# Patient Record
Sex: Female | Born: 1975
Health system: Southern US, Community
[De-identification: ages and names within clinical notes are randomized; demographics above are authoritative.]

## PROBLEM LIST (undated history)

## (undated) ENCOUNTER — Inpatient Hospital Stay (HOSPITAL_COMMUNITY): Payer: Self-pay

## (undated) ENCOUNTER — Ambulatory Visit: Admission: EM | Payer: BC Managed Care – PPO | Source: Home / Self Care

## (undated) DIAGNOSIS — F1290 Cannabis use, unspecified, uncomplicated: Secondary | ICD-10-CM

## (undated) DIAGNOSIS — J449 Chronic obstructive pulmonary disease, unspecified: Secondary | ICD-10-CM

## (undated) DIAGNOSIS — I10 Essential (primary) hypertension: Secondary | ICD-10-CM

## (undated) DIAGNOSIS — D649 Anemia, unspecified: Secondary | ICD-10-CM

## (undated) DIAGNOSIS — Z720 Tobacco use: Secondary | ICD-10-CM

## (undated) DIAGNOSIS — M21612 Bunion of left foot: Secondary | ICD-10-CM

## (undated) DIAGNOSIS — O24419 Gestational diabetes mellitus in pregnancy, unspecified control: Secondary | ICD-10-CM

## (undated) DIAGNOSIS — I701 Atherosclerosis of renal artery: Secondary | ICD-10-CM

## (undated) DIAGNOSIS — M2042 Other hammer toe(s) (acquired), left foot: Secondary | ICD-10-CM

## (undated) HISTORY — PX: OTHER SURGICAL HISTORY: SHX169

## (undated) HISTORY — PX: COLONOSCOPY: SHX174

## (undated) HISTORY — DX: Essential (primary) hypertension: I10

---

## 1998-05-10 ENCOUNTER — Emergency Department (HOSPITAL_COMMUNITY): Admission: EM | Admit: 1998-05-10 | Discharge: 1998-05-10 | Payer: Self-pay | Admitting: Emergency Medicine

## 1999-02-19 ENCOUNTER — Emergency Department (HOSPITAL_COMMUNITY): Admission: EM | Admit: 1999-02-19 | Discharge: 1999-02-19 | Payer: Self-pay | Admitting: Emergency Medicine

## 1999-02-19 ENCOUNTER — Encounter: Payer: Self-pay | Admitting: Emergency Medicine

## 1999-09-16 ENCOUNTER — Emergency Department (HOSPITAL_COMMUNITY): Admission: EM | Admit: 1999-09-16 | Discharge: 1999-09-16 | Payer: Self-pay | Admitting: Internal Medicine

## 2000-03-04 ENCOUNTER — Inpatient Hospital Stay (HOSPITAL_COMMUNITY): Admission: AD | Admit: 2000-03-04 | Discharge: 2000-03-04 | Payer: Self-pay | Admitting: Obstetrics & Gynecology

## 2000-03-31 ENCOUNTER — Emergency Department (HOSPITAL_COMMUNITY): Admission: EM | Admit: 2000-03-31 | Discharge: 2000-03-31 | Payer: Self-pay | Admitting: Emergency Medicine

## 2000-10-04 ENCOUNTER — Emergency Department (HOSPITAL_COMMUNITY): Admission: EM | Admit: 2000-10-04 | Discharge: 2000-10-04 | Payer: Self-pay | Admitting: Emergency Medicine

## 2001-05-11 ENCOUNTER — Emergency Department (HOSPITAL_COMMUNITY): Admission: EM | Admit: 2001-05-11 | Discharge: 2001-05-11 | Payer: Self-pay | Admitting: Emergency Medicine

## 2001-07-18 ENCOUNTER — Emergency Department (HOSPITAL_COMMUNITY): Admission: EM | Admit: 2001-07-18 | Discharge: 2001-07-18 | Payer: Self-pay | Admitting: Emergency Medicine

## 2001-07-21 ENCOUNTER — Emergency Department (HOSPITAL_COMMUNITY): Admission: EM | Admit: 2001-07-21 | Discharge: 2001-07-22 | Payer: Self-pay | Admitting: Emergency Medicine

## 2002-02-16 ENCOUNTER — Emergency Department (HOSPITAL_COMMUNITY): Admission: EM | Admit: 2002-02-16 | Discharge: 2002-02-16 | Payer: Self-pay | Admitting: Emergency Medicine

## 2002-03-13 ENCOUNTER — Inpatient Hospital Stay (HOSPITAL_COMMUNITY): Admission: AD | Admit: 2002-03-13 | Discharge: 2002-03-13 | Payer: Self-pay | Admitting: *Deleted

## 2002-04-20 ENCOUNTER — Emergency Department (HOSPITAL_COMMUNITY): Admission: EM | Admit: 2002-04-20 | Discharge: 2002-04-20 | Payer: Self-pay | Admitting: Emergency Medicine

## 2002-05-19 ENCOUNTER — Emergency Department (HOSPITAL_COMMUNITY): Admission: EM | Admit: 2002-05-19 | Discharge: 2002-05-19 | Payer: Self-pay | Admitting: Emergency Medicine

## 2002-08-23 ENCOUNTER — Encounter: Payer: Self-pay | Admitting: Emergency Medicine

## 2002-08-23 ENCOUNTER — Emergency Department (HOSPITAL_COMMUNITY): Admission: EM | Admit: 2002-08-23 | Discharge: 2002-08-23 | Payer: Self-pay | Admitting: Emergency Medicine

## 2003-01-16 ENCOUNTER — Emergency Department (HOSPITAL_COMMUNITY): Admission: EM | Admit: 2003-01-16 | Discharge: 2003-01-16 | Payer: Self-pay | Admitting: Emergency Medicine

## 2003-02-01 ENCOUNTER — Emergency Department (HOSPITAL_COMMUNITY): Admission: EM | Admit: 2003-02-01 | Discharge: 2003-02-01 | Payer: Self-pay | Admitting: *Deleted

## 2003-02-02 ENCOUNTER — Inpatient Hospital Stay (HOSPITAL_COMMUNITY): Admission: EM | Admit: 2003-02-02 | Discharge: 2003-02-06 | Payer: Self-pay | Admitting: Emergency Medicine

## 2003-10-16 ENCOUNTER — Emergency Department (HOSPITAL_COMMUNITY): Admission: EM | Admit: 2003-10-16 | Discharge: 2003-10-17 | Payer: Self-pay | Admitting: Emergency Medicine

## 2004-01-14 ENCOUNTER — Emergency Department (HOSPITAL_COMMUNITY): Admission: EM | Admit: 2004-01-14 | Discharge: 2004-01-14 | Payer: Self-pay | Admitting: Emergency Medicine

## 2005-04-07 ENCOUNTER — Emergency Department (HOSPITAL_COMMUNITY): Admission: EM | Admit: 2005-04-07 | Discharge: 2005-04-07 | Payer: Self-pay | Admitting: Emergency Medicine

## 2005-11-20 ENCOUNTER — Emergency Department (HOSPITAL_COMMUNITY): Admission: EM | Admit: 2005-11-20 | Discharge: 2005-11-21 | Payer: Self-pay | Admitting: Emergency Medicine

## 2005-11-23 ENCOUNTER — Emergency Department (HOSPITAL_COMMUNITY): Admission: EM | Admit: 2005-11-23 | Discharge: 2005-11-23 | Payer: Self-pay | Admitting: Family Medicine

## 2007-06-23 ENCOUNTER — Inpatient Hospital Stay (HOSPITAL_COMMUNITY): Admission: AD | Admit: 2007-06-23 | Discharge: 2007-06-23 | Payer: Self-pay | Admitting: Obstetrics & Gynecology

## 2007-11-13 ENCOUNTER — Emergency Department (HOSPITAL_COMMUNITY): Admission: EM | Admit: 2007-11-13 | Discharge: 2007-11-14 | Payer: Self-pay | Admitting: Emergency Medicine

## 2007-12-13 ENCOUNTER — Emergency Department (HOSPITAL_COMMUNITY): Admission: EM | Admit: 2007-12-13 | Discharge: 2007-12-13 | Payer: Self-pay | Admitting: Emergency Medicine

## 2008-02-16 ENCOUNTER — Emergency Department (HOSPITAL_COMMUNITY): Admission: EM | Admit: 2008-02-16 | Discharge: 2008-02-16 | Payer: Self-pay | Admitting: Emergency Medicine

## 2008-08-13 ENCOUNTER — Emergency Department (HOSPITAL_COMMUNITY): Admission: EM | Admit: 2008-08-13 | Discharge: 2008-08-13 | Payer: Self-pay | Admitting: Emergency Medicine

## 2008-08-15 ENCOUNTER — Emergency Department (HOSPITAL_COMMUNITY): Admission: EM | Admit: 2008-08-15 | Discharge: 2008-08-15 | Payer: Self-pay | Admitting: Family Medicine

## 2009-02-08 ENCOUNTER — Emergency Department (HOSPITAL_COMMUNITY): Admission: EM | Admit: 2009-02-08 | Discharge: 2009-02-09 | Payer: Self-pay | Admitting: Emergency Medicine

## 2009-04-13 ENCOUNTER — Emergency Department (HOSPITAL_COMMUNITY): Admission: EM | Admit: 2009-04-13 | Discharge: 2009-04-14 | Payer: Self-pay | Admitting: Emergency Medicine

## 2009-05-28 ENCOUNTER — Ambulatory Visit: Payer: Self-pay | Admitting: Family Medicine

## 2009-05-28 LAB — CONVERTED CEMR LAB
Basophils Absolute: 0.1 10*3/uL (ref 0.0–0.1)
Basophils Relative: 1 % (ref 0–1)
CO2: 19 meq/L (ref 19–32)
Calcium: 9.2 mg/dL (ref 8.4–10.5)
Chloride: 107 meq/L (ref 96–112)
Cholesterol: 137 mg/dL (ref 0–200)
Creatinine, Ser: 0.79 mg/dL (ref 0.40–1.20)
Eosinophils Absolute: 0.3 10*3/uL (ref 0.0–0.7)
Glucose, Bld: 71 mg/dL (ref 70–99)
HDL: 51 mg/dL (ref >39)
LDL Cholesterol: 70 mg/dL (ref 0–99)
Lymphocytes Relative: 36 % (ref 12–46)
MCV: 94.5 fL (ref 78.0–100.0)
Monocytes Relative: 8 % (ref 3–12)
Neutro Abs: 3.7 10*3/uL (ref 1.7–7.7)
RBC: 4.54 M/uL (ref 3.87–5.11)
Sodium: 142 meq/L (ref 135–145)
TSH: 0.323 microintl units/mL — ABNORMAL LOW (ref 0.350–4.500)
Total CHOL/HDL Ratio: 2.7
Vit D, 25-Hydroxy: 19 ng/mL — ABNORMAL LOW (ref 30–89)
WBC: 7.1 10*3/uL (ref 4.0–10.5)

## 2009-06-12 ENCOUNTER — Ambulatory Visit: Payer: Self-pay | Admitting: Family Medicine

## 2009-06-13 ENCOUNTER — Emergency Department (HOSPITAL_COMMUNITY): Admission: EM | Admit: 2009-06-13 | Discharge: 2009-06-13 | Payer: Self-pay | Admitting: Emergency Medicine

## 2009-07-03 ENCOUNTER — Ambulatory Visit: Payer: Self-pay | Admitting: Internal Medicine

## 2009-07-03 ENCOUNTER — Encounter (INDEPENDENT_AMBULATORY_CARE_PROVIDER_SITE_OTHER): Payer: Self-pay | Admitting: Family Medicine

## 2009-07-03 LAB — CONVERTED CEMR LAB
BUN: 12 mg/dL (ref 6–23)
Free T4: 1.12 ng/dL (ref 0.80–1.80)
Potassium: 4.1 meq/L (ref 3.5–5.3)
Sodium: 138 meq/L (ref 135–145)
T3, Free: 3.1 pg/mL (ref 2.3–4.2)

## 2009-09-11 ENCOUNTER — Ambulatory Visit: Payer: Self-pay | Admitting: Internal Medicine

## 2009-10-15 ENCOUNTER — Ambulatory Visit: Payer: Self-pay | Admitting: Family Medicine

## 2009-10-15 ENCOUNTER — Encounter (INDEPENDENT_AMBULATORY_CARE_PROVIDER_SITE_OTHER): Payer: Self-pay | Admitting: Family Medicine

## 2009-10-15 LAB — CONVERTED CEMR LAB
BUN: 11 mg/dL (ref 6–23)
Calcium: 9.3 mg/dL (ref 8.4–10.5)
Creatinine, Ser: 0.87 mg/dL (ref 0.40–1.20)

## 2009-12-18 ENCOUNTER — Emergency Department (HOSPITAL_COMMUNITY): Admission: EM | Admit: 2009-12-18 | Discharge: 2009-12-18 | Payer: Self-pay | Admitting: Emergency Medicine

## 2010-01-28 ENCOUNTER — Inpatient Hospital Stay (HOSPITAL_COMMUNITY)
Admission: AD | Admit: 2010-01-28 | Discharge: 2010-01-28 | Payer: Self-pay | Source: Home / Self Care | Admitting: Obstetrics and Gynecology

## 2010-05-04 LAB — GC/CHLAMYDIA PROBE AMP, GENITAL
Chlamydia, DNA Probe: NEGATIVE
GC Probe Amp, Genital: NEGATIVE

## 2010-05-04 LAB — WET PREP, GENITAL: Yeast Wet Prep HPF POC: NONE SEEN

## 2010-05-04 LAB — URINALYSIS, ROUTINE W REFLEX MICROSCOPIC
Bilirubin Urine: NEGATIVE
Glucose, UA: NEGATIVE mg/dL
Leukocytes, UA: NEGATIVE
Protein, ur: NEGATIVE mg/dL

## 2010-05-04 LAB — URINE MICROSCOPIC-ADD ON

## 2010-05-11 ENCOUNTER — Other Ambulatory Visit: Payer: Self-pay | Admitting: Family Medicine

## 2010-05-11 ENCOUNTER — Other Ambulatory Visit (HOSPITAL_COMMUNITY): Payer: Self-pay | Admitting: Family Medicine

## 2010-05-11 DIAGNOSIS — Z1231 Encounter for screening mammogram for malignant neoplasm of breast: Secondary | ICD-10-CM

## 2010-05-25 LAB — URINE MICROSCOPIC-ADD ON

## 2010-05-25 LAB — CBC
HCT: 38.1 % (ref 36.0–46.0)
Hemoglobin: 12.9 g/dL (ref 12.0–15.0)
MCHC: 33.7 g/dL (ref 30.0–36.0)
Platelets: 235 10*3/uL (ref 150–400)

## 2010-05-25 LAB — URINALYSIS, ROUTINE W REFLEX MICROSCOPIC
Glucose, UA: NEGATIVE mg/dL
Nitrite: NEGATIVE
Protein, ur: NEGATIVE mg/dL
Urobilinogen, UA: 1 mg/dL (ref 0.0–1.0)

## 2010-05-25 LAB — BASIC METABOLIC PANEL
BUN: 10 mg/dL (ref 6–23)
CO2: 24 mEq/L (ref 19–32)
Calcium: 8.7 mg/dL (ref 8.4–10.5)
GFR calc Af Amer: >60 mL/min (ref >60)
GFR calc non Af Amer: >60 mL/min (ref >60)
Potassium: 3.7 mEq/L (ref 3.5–5.1)
Sodium: 139 mEq/L (ref 135–145)

## 2010-05-25 LAB — PREGNANCY, URINE: Preg Test, Ur: NEGATIVE

## 2010-05-25 LAB — DIFFERENTIAL: Lymphocytes Relative: 30 % (ref 12–46)

## 2010-07-10 NOTE — H&P (Signed)
[name], Mueller                       ACCOUNT NO.:  [id number]   MEDICAL RECORD NO.:  [id number]                   PATIENT TYPE:  EMS   LOCATION:  ED                                   FACILITY:  [facility]   PHYSICIAN:  Leonia Reeves, MD                 DATE OF BIRTH:  1976/02/17   DATE OF ADMISSION:  02/02/2003  DATE OF DISCHARGE:                                HISTORY & PHYSICAL   PRIMARY CARE PHYSICIAN:  None listed.   ADMITTING DIAGNOSES AND PLAN:  1. Acute urinary tract infection.  2. Acute pyelonephritis.  Urine culture significant for E. coli sensitive to     Cipro.  IV Cipro.  3. Abdominal pain, nausea, vomiting.  IV Demerol p.r.n.  4. Acute dehydration.  IV rehydration with D-5 1/2 normal saline plus     potassium chloride.  Lab:  BMP in the morning, tomorrow.  Blood culture.   CHIEF COMPLAINT:  Abdominal pain, nausea, vomiting, fever and chills.   HISTORY OF PRESENT ILLNESS:  [name] is a 35 year old African-American  female with no known past medical history, who was treated yesterday in  [facility] Emergency Department for urinary tract infection and discharged  with p.o. Cipro, who presents with worsening nausea, vomiting, fever,  chills, and pains in suprapubic and flank areas of abdomen.  Patient says  since yesterday she has been vomiting continuously and cannot hold down any  food or drink.  She has had fever associated with chills but could not state  the exact temperature.  She also has had abdominal pain around the  suprapubic and both flank areas.  She denied cough, sputum production, chest  pain, and shortness of breath.   PAST MEDICAL HISTORY:  No known medical history.  Patient denied history of  diabetes mellitus, hypertension, and hypercholesterolemia.  She also denied  history of renal disease.   FAMILY HISTORY:  Diabetes runs in the family.   SOCIAL HISTORY:  Patient works as a Dispensing optician.  She smokes about two packs  of cigarettes per  day, drinks alcohol occasionally.  She also admitted to  use of marijuana.   ALLERGIES:  PENICILLIN.   CURRENT MEDICATIONS:  No prescribed medication.   REVIEW OF SYSTEMS:  CONSTITUTIONAL:  Fever, general weakness.  CARDIOPULMONARY:  No chest pain and no sputum production.  GIT:  Abdominal  pain, nausea, and vomiting.  No diarrhea.  Other systems reviewed negative.   PHYSICAL EXAMINATION:  GENERAL:  A young thin-built African-American female  in mild distress secondary to abdominal discomfort.  VITAL SIGNS:  Temperature 98.3, blood pressure 132/71, pulse 89, respiratory  rate 18, oxygen saturation 100% on air.  CNS:  Alert and oriented x3.  HEENT:  Head is normocephalic, atraumatic.  Pupils equal, round, reactive to  light and accommodation.  Sclerae is anicteric.  Mucous membrane is dry  consistent with severe dehydration.  Nares are patent.  No  evidence of  oropharyngeal lesion.  CARDIOVASCULAR:  Normal S1 and S2, no S3 gallop.  No rub, no murmur  appreciated.  RESPIRATORY:  Lungs are clear to auscultation and percussion.  Bilateral  breath sounds are normal.  GIT:  Abdomen is flat.  Significant suprapubic tenderness and also  significant bilateral flank tenderness.  EXTREMITIES:  No edema, no cyanosis.  NEURO:  No focal neurological deficit.  Cranial nerves II-XII are grossly  intact.   LABORATORY DATA:  Urine culture positive for Escherichia coli sensitive to  Cipro.  White count elevated at 14.1, hemoglobin 12.5, hematocrit 37.5,  platelets 288.  Sodium 141, potassium 3.7, chloride 110, CO2 25, BUN 13,  creatinine 1.1, glucose 171.  Urinalysis positive nitrite, leukocyte  esterase small, bacteria many.   ASSESSMENT:  A young African-American female who came to emergency  department yesterday and was treated for urinary tract infection with p.o.  Cipro and discharged who now presents with severe nausea and vomiting,  abdominal pain and fever associated with chills.  Patient  will be admitted  to the medical floor and managed as listed above.  For urinary tract  infection and pyelonephritis, IV Cipro.  For dehydration, IV D-5 1/2 normal  saline plus potassium chloride.  For abdominal pain, IV Demerol as needed.  For nausea and vomiting, IV Phenergan as needed.  For tobacco abuse, patient  would benefit from nicotine patch.  I have taken this opportunity during  this encounter to counsel patient regarding tobacco cessation.  She seems to  understand that continued use of tobacco will worsen her medical problem and  even predispose her to other medical problems including lung cancer and  chronic obstructive pulmonary disease.  Deep vein thrombosis prophylaxis  will be done with subcutaneous Lovenox and gastroesophageal prophylaxis will  be done with p.o. Protonix.  The rationale for this admission has been  discussed with patient and she is agreeable.                                               Leonia Reeves, MD    VO/MEDQ  D:  02/02/2003  T:  02/02/2003  Job:  [id number]

## 2010-07-10 NOTE — Discharge Summary (Signed)
Christine Mueller, Christine Mueller                       ACCOUNT NO.:  [id number]   MEDICAL RECORD NO.:  [id number]                   PATIENT TYPE:  INP   LOCATION:  0463                                 FACILITY:  [facility]   PHYSICIAN:  Melissa L. Ladona Ridgel, MD               DATE OF BIRTH:  August 08, 1975   DATE OF ADMISSION:  02/02/2003  DATE OF DISCHARGE:  02/06/2003                                 DISCHARGE SUMMARY   ADMITTING DIAGNOSES:  1. Acute urinary tract infection, being treated.  2. Acute pyelonephritis, being treated with p.o. Cipro, status post IV     Cipro.  3. Abdominal pain, nausea and vomiting, dehydration resolved.   DISCHARGE DIAGNOSES:  1. Klebsiella pneumonia, urinary tract infection, sensitive to Cipro.  2. Gastritis, resolved.  3. Hypovolemia, resolved.  4. Candida vulvovaginitis, resolving with treatment.  5. Tobacco abuse.   HISTORY OF PRESENT ILLNESS:  The patient is a 35 year old African-American  female, who presented to the [facility] Emergency Room on the day prior to  admission, complaining of urinary tract infection type symptoms, consisting  of nausea, vomiting, fever, chills, suprapubic pain, and flank pain.  She  had been given oral Cipro to treat her symptoms and then returned on the day  of admission for the worsening complaints of fever, chills, nausea,  vomiting, and severe abdominal pain.  She was admitted for rehabilitation  and IV antibiotics.   HOSPITAL COURSE:  The patient was admitted to nontelemetry floor where she  was aggressively rehydrated and started on IV ciprofloxacin.  Over the  course of her hospitalization, she reached nadir in her abdominal pain and  was able to tolerate oral intake without severe nausea or vomiting.  Before  reaching this point, however, she did undergo an episode of small amount of  unwitnessed hematemesis according to the patient and her family.  This is  likely secondary to mild gastritis related to her oral repletion  for  potassium.  Her symptoms correlated with administration of her potassium and  once placed on Pepcid 20 mg p.o. b.i.d., the patient no longer complained of  symptoms, nausea, vomiting, or abdominal upset.  The patient's course was  also complicated by symptoms of a vaginal yeast infection for which she was  treated with topical and intervaginal Miconazole.  Her potassium was  repleted, both p.o. and IV during the course of her hospital stay and on the  day of discharge, the patient was found to have stable vital signs.  She was  afebrile, able to tolerate full liquids as well as a full regular meal.  Generally, she was in no acute distress.  Her pupils were equal, round, and  reactive to light.  Her extraocular muscles were intact.  Her mucous  membranes were moist.  Her chest was clear to auscultation.  There were no  rubs, rales, or wheezes.  Her cardiovascular again was within normal limits,  regular  rate and rhythm, positive S1, S2.  Extremities showed no edema.  Her  white count had decreased to 7 with hemoglobin of 10.8 and hematocrit of  325.  Her potassium on the day of discharge was 3 with a BUN of 5 and a  creatinine of 0.9.  she was given IV repletion prior to discharge and was  instructed on how to replete her potassium at home using increased dietary  uptake.   MEDICATIONS ON DISCHARGE:  1. Cipro 250 mg p.o. b.i.d. x 6 more days.  2. Pepcid 20 mg p.o. b.i.d. x 14 days.  3. Miconazole nitrate over-the-counter to treat her yeast infection.  4. She was not in need of any pain medication at the time of discharge.  Her     activity was not restricted.   Her diet was instructed to be of increased potassium intake, bananas and  orange juice in particular were suggested.  There was no wound care  instruction.  Her follow-up was to be with her primary doctor as needed.  At  the time of discharge, she was deemed stable to continue treatment as an  outpatient on oral  antibiotics.                                               Melissa L. Ladona Ridgel, MD    MLT/MEDQ  D:  02/16/2003  T:  02/16/2003  Job:  [id number]

## 2010-08-05 ENCOUNTER — Inpatient Hospital Stay (INDEPENDENT_AMBULATORY_CARE_PROVIDER_SITE_OTHER)
Admission: RE | Admit: 2010-08-05 | Discharge: 2010-08-05 | Disposition: A | Discharge status: Home / Self Care | Payer: Self-pay | Source: Ambulatory Visit | Attending: Family Medicine | Admitting: Family Medicine

## 2010-08-05 DIAGNOSIS — A499 Bacterial infection, unspecified: Secondary | ICD-10-CM

## 2010-08-05 DIAGNOSIS — N760 Acute vaginitis: Secondary | ICD-10-CM

## 2010-08-20 ENCOUNTER — Other Ambulatory Visit: Payer: Self-pay | Admitting: Internal Medicine

## 2010-09-01 ENCOUNTER — Other Ambulatory Visit: Payer: Self-pay | Admitting: Internal Medicine

## 2010-11-18 LAB — URINALYSIS, ROUTINE W REFLEX MICROSCOPIC
Bilirubin Urine: NEGATIVE
Glucose, UA: NEGATIVE
Urobilinogen, UA: 0.2

## 2010-11-18 LAB — POCT PREGNANCY, URINE: Preg Test, Ur: NEGATIVE

## 2010-11-18 LAB — SAMPLE TO BLOOD BANK

## 2010-11-19 ENCOUNTER — Emergency Department (HOSPITAL_COMMUNITY)
Admission: EM | Admit: 2010-11-19 | Discharge: 2010-11-19 | Disposition: A | Discharge status: Home / Self Care | Payer: Self-pay | Attending: Emergency Medicine | Admitting: Emergency Medicine

## 2010-11-19 DIAGNOSIS — J069 Acute upper respiratory infection, unspecified: Secondary | ICD-10-CM | POA: Insufficient documentation

## 2010-11-19 DIAGNOSIS — J029 Acute pharyngitis, unspecified: Secondary | ICD-10-CM | POA: Insufficient documentation

## 2010-11-19 DIAGNOSIS — I10 Essential (primary) hypertension: Secondary | ICD-10-CM | POA: Insufficient documentation

## 2010-11-23 LAB — COMPREHENSIVE METABOLIC PANEL
ALT: 22
AST: 26
Albumin: 3.7
Alkaline Phosphatase: 83
BUN: 8
CO2: 24
Calcium: 9
Chloride: 108
Creatinine, Ser: 0.9
GFR calc Af Amer: >60
GFR calc non Af Amer: >60
Glucose, Bld: 85
Potassium: 3.6
Sodium: 139
Total Bilirubin: 0.7
Total Protein: 5.9 — ABNORMAL LOW

## 2010-11-23 LAB — DIFFERENTIAL
Eosinophils Absolute: 0.4
Lymphocytes Relative: 41
Lymphs Abs: 2.9
Monocytes Relative: 10
Neutro Abs: 2.9
Neutrophils Relative %: 42 — ABNORMAL LOW

## 2010-11-23 LAB — CBC
Hemoglobin: 13.2
MCV: 96.8
RBC: 4.05
WBC: 7

## 2010-11-23 LAB — URINALYSIS, ROUTINE W REFLEX MICROSCOPIC
Glucose, UA: NEGATIVE
Hgb urine dipstick: NEGATIVE
Specific Gravity, Urine: 1.03
Urobilinogen, UA: 0.2

## 2010-11-23 LAB — URINE MICROSCOPIC-ADD ON

## 2010-11-23 LAB — POCT PREGNANCY, URINE: Preg Test, Ur: NEGATIVE

## 2011-01-24 DIAGNOSIS — J4489 Other specified chronic obstructive pulmonary disease: Secondary | ICD-10-CM | POA: Insufficient documentation

## 2011-01-24 DIAGNOSIS — R05 Cough: Secondary | ICD-10-CM | POA: Insufficient documentation

## 2011-01-24 DIAGNOSIS — J40 Bronchitis, not specified as acute or chronic: Secondary | ICD-10-CM | POA: Insufficient documentation

## 2011-01-24 DIAGNOSIS — F17200 Nicotine dependence, unspecified, uncomplicated: Secondary | ICD-10-CM | POA: Insufficient documentation

## 2011-01-24 DIAGNOSIS — IMO0001 Reserved for inherently not codable concepts without codable children: Secondary | ICD-10-CM | POA: Insufficient documentation

## 2011-01-24 DIAGNOSIS — R079 Chest pain, unspecified: Secondary | ICD-10-CM | POA: Insufficient documentation

## 2011-01-24 DIAGNOSIS — R059 Cough, unspecified: Secondary | ICD-10-CM | POA: Insufficient documentation

## 2011-01-24 DIAGNOSIS — J449 Chronic obstructive pulmonary disease, unspecified: Secondary | ICD-10-CM | POA: Insufficient documentation

## 2011-01-25 ENCOUNTER — Emergency Department (HOSPITAL_COMMUNITY)
Admission: EM | Admit: 2011-01-25 | Discharge: 2011-01-25 | Disposition: A | Discharge status: Home / Self Care | Payer: Self-pay | Attending: Emergency Medicine | Admitting: Emergency Medicine

## 2011-01-25 DIAGNOSIS — J40 Bronchitis, not specified as acute or chronic: Secondary | ICD-10-CM

## 2011-01-25 HISTORY — DX: Essential (primary) hypertension: I10

## 2011-01-25 HISTORY — DX: Chronic obstructive pulmonary disease, unspecified: J44.9

## 2011-01-25 MED ORDER — HYDROCOD POLST-CHLORPHEN POLST 10-8 MG/5ML PO LQCR: 5.0000 mL | Freq: Two times a day (BID) | ORAL | Status: DC

## 2011-01-25 MED ORDER — AZITHROMYCIN 250 MG PO TABS: 500.0000 mg | ORAL_TABLET | Freq: Once | ORAL | Status: AC

## 2011-01-25 MED ORDER — ALBUTEROL SULFATE HFA 108 (90 BASE) MCG/ACT IN AERS
2.0000 | INHALATION_SPRAY | Freq: Once | RESPIRATORY_TRACT | Status: AC
  Administered 2011-01-25: 2 via RESPIRATORY_TRACT
  Filled 2011-01-25: qty 6.7

## 2011-01-25 NOTE — ED Provider Notes (Signed)
History     CSN: [id number] Arrival date & time: 01/25/2011  1:19 AM   First MD Initiated Contact with Patient 01/25/11 0245      Chief Complaint  Patient presents with  . Sore Throat    (Consider location/radiation/quality/duration/timing/severity/associated sxs/prior treatment) HPI Comments: Patient here with a week history of runny nose, cough with green sputum production, sore throat, wheezing fever and chest pain with cough  Patient is a 35 y.o. female presenting with pharyngitis. The history is provided by the patient. No language interpreter was used.  Sore Throat This is a new problem. The current episode started in the past 7 days. The problem occurs constantly. The problem has been unchanged. Associated symptoms include arthralgias, chest pain, coughing, a fever, myalgias and a sore throat. Pertinent negatives include no abdominal pain, anorexia, chills, neck pain, swollen glands or vomiting. The symptoms are aggravated by drinking and eating. She has tried nothing for the symptoms. The treatment provided no relief.    Past Medical History  Diagnosis Date  . Hypertension   . COPD (chronic obstructive pulmonary disease)   . Asthma     History reviewed. No pertinent past surgical history.  No family history on file.  History  Substance Use Topics  . Smoking status: Current Everyday Smoker -- 1.0 packs/day for 15 years    Types: Cigarettes  . Smokeless tobacco: Not on file  . Alcohol Use: No    OB History    Grav Para Term Preterm Abortions TAB SAB Ect Mult Living                  Review of Systems  Constitutional: Positive for fever. Negative for chills.  HENT: Positive for sore throat and rhinorrhea. Negative for ear pain and neck pain.   Eyes: Negative for pain.  Respiratory: Positive for cough and wheezing.   Cardiovascular: Positive for chest pain.  Gastrointestinal: Negative.  Negative for vomiting, abdominal pain and anorexia.  Genitourinary:  Negative.   Musculoskeletal: Positive for myalgias and arthralgias.  Neurological: Negative.   Hematological: Negative.   Psychiatric/Behavioral: Negative.     Allergies  Penicillins  Home Medications   Current Outpatient Rx  Name Route Sig Dispense Refill  . ALBUTEROL SULFATE HFA 108 (90 BASE) MCG/ACT IN AERS Inhalation Inhale 2 puffs into the lungs every 6 (six) hours as needed. weezing     . MAGIC MOUTHWASH Oral Take 15 mLs by mouth 4 (four) times daily as needed. Sore throat     . THERA M PLUS PO TABS Oral Take 1 tablet by mouth daily.      Marland Kitchen PRENATAL 27-0.8 MG PO TABS Oral Take 1 tablet by mouth daily.        BP 141/75  Pulse 90  Temp(Src) 98.8 F (37.1 C) (Oral)  Resp 20  SpO2 99%  LMP 01/20/2011  Physical Exam  Nursing note and vitals reviewed. Constitutional: She is oriented to person, place, and time. She appears well-developed and well-nourished.  HENT:  Head: Normocephalic and atraumatic.  Right Ear: External ear normal.  Left Ear: External ear normal.  Mouth/Throat: Oropharynx is clear and moist. No oropharyngeal exudate.  Eyes: Conjunctivae are normal. Pupils are equal, round, and reactive to light.  Neck: Normal range of motion. Neck supple.  Cardiovascular: Normal rate, regular rhythm, normal heart sounds and intact distal pulses.  Exam reveals no gallop and no friction rub.   No murmur heard. Pulmonary/Chest: Effort normal and breath sounds normal. No respiratory distress.  She has no wheezes.  Abdominal: Soft. Bowel sounds are normal. She exhibits no distension. There is no tenderness.  Musculoskeletal: Normal range of motion.  Lymphadenopathy:    She has no cervical adenopathy.  Neurological: She is alert and oriented to person, place, and time. No cranial nerve deficit.  Skin: Skin is warm and dry.  Psychiatric: She has a normal mood and affect. Her behavior is normal. Judgment and thought content normal.    ED Course  Procedures (including  critical care time)  Labs Reviewed - No data to display No results found.   Bronchitis   MDM  Bronchitis in this smoker - plan to place on z-pack, inhaler and cough medication        Scarlette Calico C. Combs, [place] 01/25/11 [phone]

## 2011-01-25 NOTE — ED Notes (Signed)
Pt reports that she has had dry cough for the past 2-3 days and sore throat for the past 5 days. Pt reports having periodic fever treating with OTC meds.  Pt reports that bilat sides are sore from cough.

## 2011-01-25 NOTE — ED Notes (Signed)
Rx given to pt. 

## 2011-01-26 NOTE — ED Provider Notes (Signed)
Medical screening examination/treatment/procedure(s) were performed by non-physician practitioner and as supervising physician I was immediately available for consultation/collaboration.  Marcas Bowsher R. Edrees Valent, MD 01/26/11 1946 

## 2011-03-25 ENCOUNTER — Encounter (HOSPITAL_COMMUNITY): Payer: Self-pay | Admitting: *Deleted

## 2011-03-25 ENCOUNTER — Emergency Department (INDEPENDENT_AMBULATORY_CARE_PROVIDER_SITE_OTHER)
Admission: EM | Admit: 2011-03-25 | Discharge: 2011-03-25 | Disposition: A | Discharge status: Home / Self Care | Payer: Self-pay | Source: Home / Self Care | Attending: Family Medicine | Admitting: Family Medicine

## 2011-03-25 DIAGNOSIS — S0180XA Unspecified open wound of other part of head, initial encounter: Secondary | ICD-10-CM

## 2011-03-25 DIAGNOSIS — S0181XA Laceration without foreign body of other part of head, initial encounter: Secondary | ICD-10-CM
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
## 2011-04-02 ENCOUNTER — Encounter (HOSPITAL_COMMUNITY): Payer: Self-pay | Admitting: Emergency Medicine

## 2011-04-02 ENCOUNTER — Emergency Department (INDEPENDENT_AMBULATORY_CARE_PROVIDER_SITE_OTHER)
Admission: EM | Admit: 2011-04-02 | Discharge: 2011-04-02 | Disposition: A | Discharge status: Home / Self Care | Payer: Self-pay | Source: Home / Self Care | Attending: Family Medicine | Admitting: Family Medicine

## 2011-04-02 DIAGNOSIS — Z4802 Encounter for removal of sutures: Secondary | ICD-10-CM

## 2011-04-02 NOTE — ED Provider Notes (Signed)
History     CSN: [id number]  Arrival date & time 04/02/11  1030   First MD Initiated Contact with Patient 04/02/11 1201      Chief Complaint  Patient presents with  . Suture / Staple Removal    (Consider location/radiation/quality/duration/timing/severity/associated sxs/prior treatment) Patient is a 36 y.o. female presenting with suture removal. The history is provided by the patient.  Suture / Staple Removal  Treated in ED: seen on 1/31 for eyebrow lac. Treatments since wound repair include antibiotic ointment use. There has been no drainage from the wound. There is no redness present. There is no swelling present. The pain has no pain.    Past Medical History  Diagnosis Date  . Hypertension   . COPD (chronic obstructive pulmonary disease)   . Asthma     No past surgical history on file.  No family history on file.  History  Substance Use Topics  . Smoking status: Current Everyday Smoker -- 1.0 packs/day for 15 years    Types: Cigarettes  . Smokeless tobacco: Not on file  . Alcohol Use: No    OB History    Grav Para Term Preterm Abortions TAB SAB Ect Mult Living                  Review of Systems  Allergies  Penicillins  Home Medications   Current Outpatient Rx  Name Route Sig Dispense Refill  . ALBUTEROL SULFATE HFA 108 (90 BASE) MCG/ACT IN AERS Inhalation Inhale 2 puffs into the lungs every 6 (six) hours as needed. weezing     . MAGIC MOUTHWASH Oral Take 15 mLs by mouth 4 (four) times daily as needed. Sore throat     . HYDROCOD POLST-CPM POLST ER 10-8 MG/5ML PO LQCR Oral Take 5 mLs by mouth every 12 (twelve) hours. 140 mL 0  . THERA M PLUS PO TABS Oral Take 1 tablet by mouth daily.      Marland Kitchen PRENATAL 27-0.8 MG PO TABS Oral Take 1 tablet by mouth daily.        BP 111/78  Pulse 80  Temp(Src) 98.4 F (36.9 C) (Oral)  Resp 16  SpO2 98%  LMP 03/11/2011  Physical Exam  Nursing note and vitals reviewed. Constitutional: She appears well-developed and  well-nourished.  Skin: Skin is warm and dry.       Lac well healed, 4 stitches removed, bacitracin ointment.    ED Course  Procedures (including critical care time)  Labs Reviewed - No data to display No results found.   1. Visit for suture removal       MDM  Sutures removed.        Barkley Bruns, MD 04/02/11 [phone]

## 2011-04-02 NOTE — ED Notes (Signed)
PT HERE FOR SUTURE REMOVAL LEFT BROW S/P LACERATION LAST Thursday.DENIES PAIN OR DRAINAGE.

## 2011-07-13 ENCOUNTER — Other Ambulatory Visit: Payer: Self-pay | Admitting: Family Medicine

## 2011-07-13 DIAGNOSIS — N6310 Unspecified lump in the right breast, unspecified quadrant: Secondary | ICD-10-CM

## 2011-07-22 ENCOUNTER — Ambulatory Visit
Admission: RE | Admit: 2011-07-22 | Discharge: 2011-07-22 | Disposition: A | Discharge status: Home / Self Care | Payer: Self-pay | Source: Ambulatory Visit | Attending: Family Medicine | Admitting: Family Medicine

## 2011-07-22 DIAGNOSIS — N6310 Unspecified lump in the right breast, unspecified quadrant: Secondary | ICD-10-CM

## 2011-09-23 ENCOUNTER — Emergency Department (HOSPITAL_COMMUNITY)
Admission: EM | Admit: 2011-09-23 | Discharge: 2011-09-23 | Disposition: A | Discharge status: Home / Self Care | Payer: No Typology Code available for payment source | Attending: Emergency Medicine | Admitting: Emergency Medicine

## 2011-09-23 ENCOUNTER — Emergency Department (HOSPITAL_COMMUNITY): Payer: Self-pay

## 2011-09-23 DIAGNOSIS — F17200 Nicotine dependence, unspecified, uncomplicated: Secondary | ICD-10-CM | POA: Insufficient documentation

## 2011-09-23 DIAGNOSIS — Z880 Allergy status to penicillin: Secondary | ICD-10-CM | POA: Insufficient documentation

## 2011-09-23 DIAGNOSIS — J4489 Other specified chronic obstructive pulmonary disease: Secondary | ICD-10-CM | POA: Insufficient documentation

## 2011-09-23 DIAGNOSIS — J449 Chronic obstructive pulmonary disease, unspecified: Secondary | ICD-10-CM | POA: Insufficient documentation

## 2011-09-23 DIAGNOSIS — M545 Low back pain, unspecified: Secondary | ICD-10-CM | POA: Insufficient documentation

## 2011-09-23 DIAGNOSIS — I10 Essential (primary) hypertension: Secondary | ICD-10-CM | POA: Insufficient documentation

## 2011-09-23 MED ORDER — CYCLOBENZAPRINE HCL 5 MG PO TABS: 5.0000 mg | ORAL_TABLET | Freq: Three times a day (TID) | ORAL | Status: AC | PRN

## 2011-09-23 MED ORDER — ACETAMINOPHEN 325 MG PO TABS
650.0000 mg | ORAL_TABLET | Freq: Once | ORAL | Status: AC
  Administered 2011-09-23: 650 mg via ORAL
  Filled 2011-09-23: qty 2

## 2011-09-23 MED ORDER — NAPROXEN 500 MG PO TABS: 500.0000 mg | ORAL_TABLET | Freq: Two times a day (BID) | ORAL | Status: AC
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
## 2011-12-16 ENCOUNTER — Other Ambulatory Visit: Payer: Self-pay | Admitting: Family Medicine

## 2011-12-16 DIAGNOSIS — N63 Unspecified lump in unspecified breast: Secondary | ICD-10-CM

## 2012-01-04 ENCOUNTER — Other Ambulatory Visit: Payer: Self-pay

## 2012-01-06 ENCOUNTER — Ambulatory Visit
Admission: RE | Admit: 2012-01-06 | Discharge: 2012-01-06 | Disposition: A | Discharge status: Home / Self Care | Payer: Self-pay | Source: Ambulatory Visit | Attending: Family Medicine | Admitting: Family Medicine

## 2012-01-06 DIAGNOSIS — N63 Unspecified lump in unspecified breast: Secondary | ICD-10-CM

## 2012-05-31 ENCOUNTER — Encounter (HOSPITAL_COMMUNITY): Payer: Self-pay | Admitting: Emergency Medicine

## 2012-05-31 ENCOUNTER — Emergency Department (INDEPENDENT_AMBULATORY_CARE_PROVIDER_SITE_OTHER)
Admission: EM | Admit: 2012-05-31 | Discharge: 2012-05-31 | Disposition: A | Discharge status: Home / Self Care | Payer: Self-pay | Source: Home / Self Care | Attending: Emergency Medicine | Admitting: Emergency Medicine

## 2012-05-31 DIAGNOSIS — L0390 Cellulitis, unspecified: Secondary | ICD-10-CM

## 2012-05-31 MED ORDER — IBUPROFEN 800 MG PO TABS
800.0000 mg | ORAL_TABLET | Freq: Once | ORAL | Status: AC
  Administered 2012-05-31: 800 mg via ORAL

## 2012-05-31 MED ORDER — OXYCODONE-ACETAMINOPHEN 5-325 MG PO TABS: ORAL_TABLET | ORAL | Status: DC

## 2012-05-31 MED ORDER — IBUPROFEN 800 MG PO TABS
ORAL_TABLET | ORAL | Status: AC
  Filled 2012-05-31: qty 1

## 2012-05-31 MED ORDER — SULFAMETHOXAZOLE-TMP DS 800-160 MG PO TABS: 2.0000 | ORAL_TABLET | Freq: Two times a day (BID) | ORAL | Status: DC

## 2012-05-31 NOTE — ED Notes (Signed)
Discharge pending dr Lorenz Coaster contacting dr Orlan Leavens

## 2012-05-31 NOTE — ED Notes (Signed)
Right hand pain.  Reports having cut at a callus or dry skin on palm of right hand.  Hand is painful, red, and swollen.

## 2012-05-31 NOTE — ED Provider Notes (Signed)
Chief Complaint:   Chief Complaint  Patient presents with  . Hand Pain    History of Present Illness:   Christine Mueller is a 37 year old female who has had a five-day history of pain in the palm of her right hand. She had a callus on the distal palmar crease, overlying the fifth metacarpal. She tried to trim this herself with a razor blade and ever since then it's been red, swollen, and painful. She denies any numbness or tingling. She's had no fever or chills. History tender to touch. She is able to flex and extend her little finger and ring finger without any difficulty.  Review of Systems:  Other than noted above, the patient denies any of the following symptoms: Systemic:  No fevers, chills, sweats, or aches.  No fatigue or tiredness. Musculoskeletal:  No joint pain, arthritis, bursitis, swelling, back pain, or neck pain. Neurological:  No muscular weakness, paresthesias, headache, or trouble with speech or coordination.  No dizziness.  PMFSH:  Past medical history, family history, social history, meds, and allergies were reviewed.  She has asthma and hypertension. She has no albuterol inhaler at home. She's not taking anything for her blood pressure. She is allergic to penicillin.  Physical Exam:   Vital signs:  BP 137/86  Pulse 82  Temp(Src) 98.5 F (36.9 C) (Oral)  Resp 16  SpO2 97%  LMP 04/26/2012 Gen:  Alert and oriented times 3.  In no distress. Musculoskeletal: She has a callus on the distal palmar crease overlying the fifth metacarpal. There is surrounding erythema. No visible collection of pus. She able to actively and passively flex and extend the little finger and the ring finger without any difficulty. Sensation is intact.  Otherwise, all joints had a full a ROM with no swelling, bruising or deformity.  No edema, pulses full. Extremities were warm and pink.  Capillary refill was brisk.  Skin:  Clear, warm and dry.  No rash. Neuro:  Alert and oriented times 3.  Muscle  strength was normal.  Sensation was intact to light touch.   Course in Urgent Care Center:   She was given ibuprofen 800 mg by mouth since she is driving home from here.  Assessment:  The encounter diagnosis was Cellulitis.  There is no evidence of tenosynovitis. I think this is a localized cellulitis. There is no evidence of a pus collection. I think she does need followup with a hand surgeon tomorrow, and have asked her to call Dr. Bradly Bienenstock.  Plan:   1.  The following meds were prescribed:   New Prescriptions   OXYCODONE-ACETAMINOPHEN (PERCOCET) 5-325 MG PER TABLET    1 to 2 tablets every 6 hours as needed for pain.   SULFAMETHOXAZOLE-TRIMETHOPRIM (BACTRIM DS) 800-160 MG PER TABLET    Take 2 tablets by mouth 2 (two) times daily.   2.  The patient was instructed in symptomatic care, including rest and activity, elevation, application of ice and compression.  Appropriate handouts were given. 3.  The patient was told to return if becoming worse in any way, if no better in 3 or 4 days, and given some red flag symptoms such as fever, worsening pain, disability, or neurological symptoms that would indicate earlier return.   4.  The patient was told to follow up with Dr. Bradly Bienenstock tomorrow.    Reuben Likes, MD 05/31/12 [phone]

## 2012-05-31 NOTE — ED Notes (Signed)
Last tetanus was January 2014-for her job

## 2012-06-02 ENCOUNTER — Telehealth (HOSPITAL_COMMUNITY): Payer: Self-pay | Admitting: *Deleted

## 2012-06-02 NOTE — ED Notes (Signed)
Pt. called on VM yesterday and said her Rx. was not at the [facility] on [facility]. She asked for it to be called in.  I called pt. back and told her it was at the [facility] and it should have been on her discharge instructions. She said she found it and got it yesterday. Vassie Moselle 06/02/2012

## 2013-07-31 ENCOUNTER — Emergency Department (INDEPENDENT_AMBULATORY_CARE_PROVIDER_SITE_OTHER)
Admission: EM | Admit: 2013-07-31 | Discharge: 2013-07-31 | Disposition: A | Discharge status: Home / Self Care | Payer: Self-pay | Source: Home / Self Care | Attending: Emergency Medicine | Admitting: Emergency Medicine

## 2013-07-31 ENCOUNTER — Other Ambulatory Visit (HOSPITAL_COMMUNITY)
Admission: RE | Admit: 2013-07-31 | Discharge: 2013-07-31 | Disposition: A | Discharge status: Home / Self Care | Payer: Self-pay | Source: Ambulatory Visit | Attending: Emergency Medicine | Admitting: Emergency Medicine

## 2013-07-31 ENCOUNTER — Encounter (HOSPITAL_COMMUNITY): Payer: Self-pay | Admitting: Emergency Medicine

## 2013-07-31 DIAGNOSIS — N760 Acute vaginitis: Secondary | ICD-10-CM

## 2013-07-31 DIAGNOSIS — L0390 Cellulitis, unspecified: Secondary | ICD-10-CM

## 2013-07-31 DIAGNOSIS — L0291 Cutaneous abscess, unspecified: Secondary | ICD-10-CM

## 2013-07-31 DIAGNOSIS — Z113 Encounter for screening for infections with a predominantly sexual mode of transmission: Secondary | ICD-10-CM | POA: Insufficient documentation

## 2013-07-31 LAB — POCT URINALYSIS DIP (DEVICE)
Bilirubin Urine: NEGATIVE
Glucose, UA: NEGATIVE mg/dL
Hgb urine dipstick: NEGATIVE
NITRITE: NEGATIVE
PROTEIN: NEGATIVE mg/dL
Specific Gravity, Urine: 1.02 (ref 1.005–1.030)
UROBILINOGEN UA: 0.2 mg/dL (ref 0.0–1.0)
pH: 7 (ref 5.0–8.0)

## 2013-07-31 LAB — POCT PREGNANCY, URINE: PREG TEST UR: NEGATIVE

## 2013-07-31 MED ORDER — METRONIDAZOLE 500 MG PO TABS: 500.0000 mg | ORAL_TABLET | Freq: Two times a day (BID) | ORAL | Status: DC

## 2013-07-31 MED ORDER — SULFAMETHOXAZOLE-TRIMETHOPRIM 800-160 MG PO TABS: 1.0000 | ORAL_TABLET | Freq: Two times a day (BID) | ORAL | Status: DC

## 2013-07-31 NOTE — ED Notes (Signed)
Please call [phone] for any lab issues

## 2013-07-31 NOTE — ED Provider Notes (Signed)
CSN: [id number]     Arrival date & time 07/31/13  1855 History   First MD Initiated Contact with Patient 07/31/13 1931     Chief Complaint  Patient presents with  . Abdominal Pain   (Consider location/radiation/quality/duration/timing/severity/associated sxs/prior Treatment) HPI Patient is a 38 yo F presenting to urgent care for vaginal discharge and bumps on leg.  Vaginal discharge- Started 2 days ago. White discharge, some odor. Reports pain with intercourse. No known exposure to STD. No dysuria. She has had BV in the past but this feels different. Some abdominal pain. No abnormal bleeding. LMP was May 25, 3 days of normal bleeding. She does report a new soap.  Bumps on legs- Reports pustules on right inner thigh x1 month. Drains yellow pus when it is open. Reports pain. NO fevers. New pustule came up recently, not drainage at this time. No redness around the bumps. No history of abscesses prior to this episode. No contacts with similar rash.   Past Medical History  Diagnosis Date  . Hypertension   . COPD (chronic obstructive pulmonary disease)   . Asthma    History reviewed. No pertinent past surgical history. History reviewed. No pertinent family history. History  Substance Use Topics  . Smoking status: Current Every Day Smoker -- 1.00 packs/day for 15 years    Types: Cigarettes  . Smokeless tobacco: Not on file  . Alcohol Use: No   OB History   Grav Para Term Preterm Abortions TAB SAB Ect Mult Living                 Review of Systems  Constitutional: Negative for fever and chills.  HENT: Negative for congestion.   Eyes: Negative for visual disturbance.  Respiratory: Negative for cough and shortness of breath.   Cardiovascular: Negative for chest pain and leg swelling.  Gastrointestinal: Negative for abdominal pain.  Genitourinary: Positive for vaginal discharge, vaginal pain and pelvic pain. Negative for dysuria and vaginal bleeding.  Musculoskeletal: Negative for  arthralgias and myalgias.  Skin: Positive for rash.  Neurological: Negative for headaches.    Allergies  Penicillins  Home Medications   Prior to Admission medications   Medication Sig Start Date End Date Taking? Authorizing Provider  albuterol (PROVENTIL HFA;VENTOLIN HFA) 108 (90 BASE) MCG/ACT inhaler Inhale 2 puffs into the lungs every 6 (six) hours as needed. weezing     Historical Provider, MD  Alum & Mag Hydroxide-Simeth (MAGIC MOUTHWASH) SOLN Take 15 mLs by mouth 4 (four) times daily as needed. Sore throat     Historical Provider, MD  lisinopril (PRINIVIL,ZESTRIL) 20 MG tablet Take 20 mg by mouth daily.    Historical Provider, MD  metroNIDAZOLE (FLAGYL) 500 MG tablet Take 1 tablet (500 mg total) by mouth 2 (two) times daily. 07/31/13   Lunah Losasso Nydia BoutonM Dagmawi Venable, MD  Multiple Vitamins-Minerals (MULTIVITAMINS THER. W/MINERALS) TABS Take 1 tablet by mouth daily.      Historical Provider, MD  oxyCODONE-acetaminophen (PERCOCET) 5-325 MG per tablet 1 to 2 tablets every 6 hours as needed for pain. 05/31/12   Reuben Likesavid C Keller, MD  Prenatal Vit-Fe Fumarate-FA (MULTIVITAMIN-PRENATAL) 27-0.8 MG TABS Take 1 tablet by mouth daily.      Historical Provider, MD  sulfamethoxazole-trimethoprim (BACTRIM DS) 800-160 MG per tablet Take 1 tablet by mouth 2 (two) times daily. 07/31/13   Haley Fuerstenberg Nydia BoutonM Devron Cohick, MD   BP 156/97  Pulse 82  Temp(Src) 98.4 F (36.9 C) (Oral)  Resp 16  SpO2 99%  LMP 07/11/2013 Physical Exam  Constitutional: She is oriented to person, place, and time. She appears well-developed and well-nourished. No distress.  HENT:  Head: Normocephalic and atraumatic.  Neck: Normal range of motion. Neck supple.  Cardiovascular: Normal rate, regular rhythm and normal heart sounds.   No murmur heard. Pulmonary/Chest: Effort normal and breath sounds normal. She has no wheezes.  Abdominal: Soft. She exhibits no distension. There is no tenderness.  Genitourinary: Vaginal discharge (Scant white discharge, no  odor) found.  Cervix wnl.  Musculoskeletal: Normal range of motion. She exhibits no edema and no tenderness.  Neurological: She is alert and oriented to person, place, and time.  Skin: Skin is warm and dry.  One large punctate lesion noted on right inner thigh with surrounding erythema and induration, draining small amount of purulent fluid. TTP. Two smaller pustules distal to larger area without erythema and no drainage.  Psychiatric: She has a normal mood and affect.    ED Course  Procedures (including critical care time) Labs Review Labs Reviewed  POCT URINALYSIS DIP (DEVICE) - Abnormal; Notable for the following:    Ketones, ur TRACE (*)    Leukocytes, UA SMALL (*)    All other components within normal limits  HIV ANTIBODY (ROUTINE TESTING)  RPR  POCT PREGNANCY, URINE  CERVICOVAGINAL ANCILLARY ONLY    Imaging Review No results found.   MDM   1. Vaginitis   2. Cutaneous abscess    Vaginitis most likely irritation secondary to detergent/soaps. Will give Rx for Flagyl in case the affirm shows BV. Patient aware to hold on to script until she hears from [place]. Will check for GC/CT, HIV and RPR for full work up. F/u with PCP if fails to improve.  Multiple small cutaneous abscesses without any fluctuance or area to drain. Con't warm compresses. Bactrim DS x7 days. F/u if not improving or worsens.    Hilarie Fredrickson, MD 07/31/13 2037

## 2013-07-31 NOTE — ED Notes (Signed)
C/o pain w intercourse , vaginal odor x 2 days. Also has infected bumps that drain pus on her leg

## 2013-07-31 NOTE — ED Provider Notes (Signed)
Medical screening examination/treatment/procedure(s) were performed by a resident physician and as supervising physician I was immediately available for consultation/collaboration.  Leslee Home, M.D.   Reuben Likes, MD 07/31/13 2223

## 2013-07-31 NOTE — Discharge Instructions (Signed)
Use warm compresses on the abscess. Take Bactrim for this.  We will let you know if anything comes back on your tests. No news is good news. I have printed off Flagyl for you in case it is bacterial vaginosis.   If you have any concerns, please let us know or follow up with your primary doctor.   Abscess An abscess is an infected area that contains a collection of pus and debris.It can occur in almost any part of the body. An abscess is also known as a furuncle or boil. CAUSES  An abscess occurs when tissue gets infected. This can occur from blockage of oil or sweat glands, infection of hair follicles, or a minor injury to the skin. As the body tries to fight the infection, pus collects in the area and creates pressure under the skin. This pressure causes pain. People with weakened immune systems have difficulty fighting infections and get certain abscesses more often.  SYMPTOMS Usually an abscess develops on the skin and becomes a painful mass that is red, warm, and tender. If the abscess forms under the skin, you may feel a moveable soft area under the skin. Some abscesses break open (rupture) on their own, but most will continue to get worse without care. The infection can spread deeper into the body and eventually into the bloodstream, causing you to feel ill.  DIAGNOSIS  Your caregiver will take your medical history and perform a physical exam. A sample of fluid may also be taken from the abscess to determine what is causing your infection. TREATMENT  Your caregiver may prescribe antibiotic medicines to fight the infection. However, taking antibiotics alone usually does not cure an abscess. Your caregiver may need to make a small cut (incision) in the abscess to drain the pus. In some cases, gauze is packed into the abscess to reduce pain and to continue draining the area. HOME CARE INSTRUCTIONS   Only take over-the-counter or prescription medicines for pain, discomfort, or fever as directed  by your caregiver.  If you were prescribed antibiotics, take them as directed. Finish them even if you start to feel better.  If gauze is used, follow your caregiver's directions for changing the gauze.  To avoid spreading the infection:  Keep your draining abscess covered with a bandage.  Wash your hands well.  Do not share personal care items, towels, or whirlpools with others.  Avoid skin contact with others.  Keep your skin and clothes clean around the abscess.  Keep all follow-up appointments as directed by your caregiver. SEEK MEDICAL CARE IF:   You have increased pain, swelling, redness, fluid drainage, or bleeding.  You have muscle aches, chills, or a general ill feeling.  You have a fever. MAKE SURE YOU:   Understand these instructions.  Will watch your condition.  Will get help right away if you are not doing well or get worse. Document Released: 11/18/2004 Document Revised: 08/10/2011 Document Reviewed: 04/23/2011 [facility] Patient Information 2014 [place], [place].

## 2013-08-01 ENCOUNTER — Telehealth (HOSPITAL_COMMUNITY): Payer: Self-pay | Admitting: Emergency Medicine

## 2013-08-01 LAB — HIV ANTIBODY (ROUTINE TESTING W REFLEX): HIV 1&2 Ab, 4th Generation: NONREACTIVE

## 2013-08-01 LAB — RPR

## 2013-08-01 MED ORDER — AZITHROMYCIN 500 MG PO TABS: 1000.0000 mg | ORAL_TABLET | Freq: Once | ORAL | Status: DC

## 2013-08-01 NOTE — ED Notes (Signed)
GC neg., Chlamydia pos., Affirm: Candida and Trich neg., Gardnerella pos., HIV/RPR non-reactive.  Pt. adequately treated with Flagyl and needs Zithromax.  Will call pt. tomorrow. Vassie Moselle 08/01/2013

## 2013-08-01 NOTE — ED Notes (Signed)
Patient's DNA probe came back positive for Chlamydia. She was not treated for this. I will send a prescription into her pharmacy, the [facility] at [facility] for azithromycin, 500 mg, #2 tabs, take 2 tablets all at 1 time. We will need to call the patient and informed of this result, have notified her partner or partners, and reported to the health department.  Reuben Likes, MD 08/01/13 [phone]

## 2013-08-02 ENCOUNTER — Telehealth (HOSPITAL_COMMUNITY): Payer: Self-pay | Admitting: *Deleted

## 2013-08-02 NOTE — ED Notes (Signed)
Pt. Called back.  Pt. verified x 2 and given results. Pt. told she was adequately treated for bacterial vaginosis with Flagyl and needs Zithromax for Chlamydia.  Pt. instructed to notify her partner, no sex for 1 week and to practice safe sex. Pt. told she can get HIV testing at the [facility]. STD clinic, by appointment.  Pt. told where to pick up her Rx. Pt. voiced understanding.  DHHS form completed and faxed to the [facility] Department. Vassie Moselle 08/02/2013

## 2013-08-13 ENCOUNTER — Inpatient Hospital Stay (HOSPITAL_COMMUNITY): Payer: Self-pay

## 2013-08-13 ENCOUNTER — Inpatient Hospital Stay (HOSPITAL_COMMUNITY)
Admission: AD | Admit: 2013-08-13 | Discharge: 2013-08-13 | Disposition: A | Discharge status: Home / Self Care | Payer: Self-pay | Source: Ambulatory Visit | Attending: Obstetrics & Gynecology | Admitting: Obstetrics & Gynecology

## 2013-08-13 DIAGNOSIS — N920 Excessive and frequent menstruation with regular cycle: Secondary | ICD-10-CM | POA: Insufficient documentation

## 2013-08-13 DIAGNOSIS — J449 Chronic obstructive pulmonary disease, unspecified: Secondary | ICD-10-CM | POA: Insufficient documentation

## 2013-08-13 DIAGNOSIS — I10 Essential (primary) hypertension: Secondary | ICD-10-CM | POA: Insufficient documentation

## 2013-08-13 DIAGNOSIS — N921 Excessive and frequent menstruation with irregular cycle: Secondary | ICD-10-CM

## 2013-08-13 DIAGNOSIS — F17200 Nicotine dependence, unspecified, uncomplicated: Secondary | ICD-10-CM | POA: Insufficient documentation

## 2013-08-13 DIAGNOSIS — J4489 Other specified chronic obstructive pulmonary disease: Secondary | ICD-10-CM | POA: Insufficient documentation

## 2013-08-13 LAB — URINALYSIS, ROUTINE W REFLEX MICROSCOPIC
Bilirubin Urine: NEGATIVE
GLUCOSE, UA: NEGATIVE mg/dL
Ketones, ur: 15 mg/dL — AB
Leukocytes, UA: NEGATIVE
Nitrite: NEGATIVE
Protein, ur: NEGATIVE mg/dL
Specific Gravity, Urine: 1.025 (ref 1.005–1.030)
UROBILINOGEN UA: 0.2 mg/dL (ref 0.0–1.0)
pH: 6 (ref 5.0–8.0)

## 2013-08-13 LAB — URINE MICROSCOPIC-ADD ON

## 2013-08-13 LAB — POCT PREGNANCY, URINE: PREG TEST UR: NEGATIVE

## 2013-08-13 LAB — WET PREP, GENITAL
CLUE CELLS WET PREP: NONE SEEN
Trich, Wet Prep: NONE SEEN
WBC, Wet Prep HPF POC: NONE SEEN
Yeast Wet Prep HPF POC: NONE SEEN

## 2013-08-13 NOTE — MAU Provider Note (Signed)
None     Chief Complaint:  Vaginal Bleeding and Abdominal Cramping   Christine Mueller is  38 y.o. No obstetric history on file..  Patient's last menstrual period was 08/09/2013.Marland Kitchen.  Her pregnancy status is negative.  She presents complaining of Vaginal Bleeding and Abdominal Cramping She states that her normal period is light and only lasts a few days.  Has not been sexually active in over a month, uses condoms PRN.  States she is wanting to get pregnant ASAP  Past Medical History  Diagnosis Date  . Hypertension   . COPD (chronic obstructive pulmonary disease)   . Asthma     No past surgical history on file.  No family history on file.  History  Substance Use Topics  . Smoking status: Current Every Day Smoker -- 1.00 packs/day for 15 years    Types: Cigarettes  . Smokeless tobacco: Not on file  . Alcohol Use: No    Allergies:  Allergies  Allergen Reactions  . Penicillins Swelling    Prescriptions prior to admission  Medication Sig Dispense Refill  . Prenatal Vit-Fe Fumarate-FA (MULTIVITAMIN-PRENATAL) 27-0.8 MG TABS Take 1 tablet by mouth daily.        Marland Kitchen. albuterol (PROVENTIL HFA;VENTOLIN HFA) 108 (90 BASE) MCG/ACT inhaler Inhale 2 puffs into the lungs every 6 (six) hours as needed. weezing       . Alum & Mag Hydroxide-Simeth (MAGIC MOUTHWASH) SOLN Take 15 mLs by mouth 4 (four) times daily as needed. Sore throat       . lisinopril (PRINIVIL,ZESTRIL) 20 MG tablet Take 20 mg by mouth daily.         Review of Systems   Constitutional: Negative for fever and chills Eyes: Negative for visual disturbances Respiratory: Negative for shortness of breath, dyspnea Cardiovascular: Negative for chest pain or palpitations  Gastrointestinal: Negative for vomiting, diarrhea and constipation Genitourinary: Negative for dysuria and urgency Musculoskeletal: Negative for back pain, joint pain, myalgias  Neurological: Negative for dizziness and headaches     Physical Exam   Blood  pressure 140/73, pulse 86, temperature 99 F (37.2 C), temperature source Oral, resp. rate 18, height 5\' 4"  (1.626 m), weight 74.481 kg (164 lb 3.2 oz), last menstrual period 08/09/2013, SpO2 100.00%.  General: General appearance - alert, well appearing, and in no distress Chest - clear to auscultation, no wheezes, rales or rhonchi, symmetric air entry Heart - normal rate and regular rhythm Abdomen - soft, nontender, nondistended, no masses or organomegaly Pelvic - normal external genitalia, vulva, vagina, cervix, uterus and adnexa.  Moderate amount of dark brown blood coming from cervical os.  Cx non friable, no tenderness with bimanual Extremities - no pedal edema noted   Labs: Results for orders placed during the hospital encounter of 08/13/13 (from the past 24 hour(s))  URINALYSIS, ROUTINE W REFLEX MICROSCOPIC   Collection Time    08/13/13  3:21 PM      Result Value Ref Range   Color, Urine YELLOW  YELLOW   APPearance CLEAR  CLEAR   Specific Gravity, Urine 1.025  1.005 - 1.030   pH 6.0  5.0 - 8.0   Glucose, UA NEGATIVE  NEGATIVE mg/dL   Hgb urine dipstick LARGE (*) NEGATIVE   Bilirubin Urine NEGATIVE  NEGATIVE   Ketones, ur 15 (*) NEGATIVE mg/dL   Protein, ur NEGATIVE  NEGATIVE mg/dL   Urobilinogen, UA 0.2  0.0 - 1.0 mg/dL   Nitrite NEGATIVE  NEGATIVE   Leukocytes, UA NEGATIVE  NEGATIVE  URINE MICROSCOPIC-ADD ON   Collection Time    08/13/13  3:21 PM      Result Value Ref Range   Squamous Epithelial / LPF FEW (*) RARE   WBC, UA 0-2  <3 WBC/hpf   RBC / HPF 21-50  <3 RBC/hpf  POCT PREGNANCY, URINE   Collection Time    08/13/13  3:27 PM      Result Value Ref Range   Preg Test, Ur NEGATIVE  NEGATIVE  WET PREP, GENITAL   Collection Time    08/13/13  5:00 PM      Result Value Ref Range   Yeast Wet Prep HPF POC NONE SEEN  NONE SEEN   Trich, Wet Prep NONE SEEN  NONE SEEN   Clue Cells Wet Prep HPF POC NONE SEEN  NONE SEEN   WBC, Wet Prep HPF POC NONE SEEN  NONE SEEN    Imaging Studies:   .EXAM: TRANSABDOMINAL AND TRANSVAGINAL ULTRASOUND OF PELVIS   TECHNIQUE: Both transabdominal and transvaginal ultrasound examinations of the pelvis were performed. Transabdominal technique was performed for global imaging of the pelvis including uterus, ovaries, adnexal regions, and pelvic cul-de-sac. It was necessary to proceed with endovaginal exam following the transabdominal exam to visualize the uterus and ovaries.   COMPARISON:  11/14/2007   FINDINGS: Uterus   Measurements: 6.5 x 3.4 x 3.4 cm. No fibroids or other mass visualized. Incidentally, the uterus is retroverted.   Endometrium   Thickness: 5 mm.  No focal abnormality visualized.   Right ovary   Measurements: 4.1 x 1.8 x 1.4 cm. Normal appearance/no adnexal mass.   Left ovary   Measurements: 2.5 x 1.6 x 2.0 cm. Normal appearance/no adnexal mass.   Other findings   No free fluid.   IMPRESSION: 1. A cause for the patient's dysfunctional uterine bleeding is not sonographically apparent. No significant abnormality noted.    Assessment:   menorrhagia  Plan:  Gc/CHL pending  No treatment since pt is wanting to get pregnant.  May consider Lysteda--has an appt with OB/GYN this month to discuss fertility  Christine Mueller,Christine Mueller

## 2013-08-13 NOTE — MAU Note (Signed)
Pt states her regular cycles are usually 4 days and she never has clots. She has been bleeding for the past 6 days and has many clots as well as mid to upper abd pain.  Denies dysuria

## 2013-08-13 NOTE — ED Notes (Signed)
Call from patient , states she has been having heavy vaginal bleeding since started the new Rx for infection.adv[facility] here [facility]

## 2013-08-13 NOTE — Discharge Instructions (Signed)
Abnormal Uterine Bleeding Abnormal uterine bleeding can affect women at various stages in life, including teenagers, women in their reproductive years, pregnant women, and women who have reached menopause. Several kinds of uterine bleeding are considered abnormal, including:  Bleeding or spotting between periods.   Bleeding after sexual intercourse.   Bleeding that is heavier or more than normal.   Periods that last longer than usual.  Bleeding after menopause.  Many cases of abnormal uterine bleeding are minor and simple to treat, while others are more serious. Any type of abnormal bleeding should be evaluated by your health care provider. Treatment will depend on the cause of the bleeding. HOME CARE INSTRUCTIONS Monitor your condition for any changes. The following actions may help to alleviate any discomfort you are experiencing:  Avoid the use of tampons and douches as directed by your health care provider.  Change your pads frequently. You should get regular pelvic exams and Pap tests. Keep all follow-up appointments for diagnostic tests as directed by your health care provider.  SEEK MEDICAL CARE IF:   Your bleeding lasts more than 1 week.   You feel dizzy at times.  SEEK IMMEDIATE MEDICAL CARE IF:   You pass out.   You are changing pads every 15 to 30 minutes.   You have abdominal pain.  You have a fever.   You become sweaty or weak.   You are passing large blood clots from the vagina.   You start to feel nauseous and vomit. MAKE SURE YOU:   Understand these instructions.  Will watch your condition.  Will get help right away if you are not doing well or get worse. Document Released: 02/08/2005 Document Revised: 02/13/2013 Document Reviewed: 09/07/2012 [employer] Patient Information 2015 [employer]. This information is not intended to replace advice given to you by your health care provider. Make sure you discuss any questions you have with your  health care provider.  

## 2013-08-13 NOTE — MAU Note (Signed)
Patient states she has had heavy bleeding and been passing clots. States she was given Flagyl and Bactrim at Urgent Care and thinks that is why she is bleeding . Pills remain in the bottles she was givenHas some mild cramping.

## 2013-08-14 LAB — GC/CHLAMYDIA PROBE AMP
CT Probe RNA: POSITIVE — AB
GC Probe RNA: NEGATIVE

## 2013-08-15 ENCOUNTER — Telehealth (HOSPITAL_BASED_OUTPATIENT_CLINIC_OR_DEPARTMENT_OTHER): Payer: Self-pay

## 2013-08-15 NOTE — Telephone Encounter (Signed)
Call form Brandi w/Health Dept questioning whether pt rcvd tx for STD.  Informed Rx rcvd for 1,000 mg of Zithromax 08/01/2013.

## 2013-08-16 ENCOUNTER — Encounter (HOSPITAL_COMMUNITY): Payer: Self-pay | Admitting: *Deleted

## 2013-08-16 NOTE — MAU Provider Note (Signed)
Attestation of Attending Supervision of Advanced Practitioner (CNM/NP): Evaluation and management procedures were performed by the Advanced Practitioner under my supervision and collaboration. I have reviewed the Advanced Practitioner's note and chart, and I agree with the management and plan.  LEGGETT,KELLY H. 11:47 AM   

## 2014-08-13 ENCOUNTER — Encounter (HOSPITAL_COMMUNITY): Payer: Self-pay

## 2014-08-13 ENCOUNTER — Emergency Department (HOSPITAL_COMMUNITY)
Admission: EM | Admit: 2014-08-13 | Discharge: 2014-08-13 | Disposition: A | Discharge status: Home / Self Care | Payer: Self-pay | Attending: Emergency Medicine | Admitting: Emergency Medicine

## 2014-08-13 ENCOUNTER — Emergency Department (HOSPITAL_COMMUNITY): Payer: Self-pay

## 2014-08-13 DIAGNOSIS — Z79899 Other long term (current) drug therapy: Secondary | ICD-10-CM | POA: Insufficient documentation

## 2014-08-13 DIAGNOSIS — Z880 Allergy status to penicillin: Secondary | ICD-10-CM | POA: Insufficient documentation

## 2014-08-13 DIAGNOSIS — R109 Unspecified abdominal pain: Secondary | ICD-10-CM

## 2014-08-13 DIAGNOSIS — Z720 Tobacco use: Secondary | ICD-10-CM | POA: Insufficient documentation

## 2014-08-13 DIAGNOSIS — Z3202 Encounter for pregnancy test, result negative: Secondary | ICD-10-CM | POA: Insufficient documentation

## 2014-08-13 DIAGNOSIS — I10 Essential (primary) hypertension: Secondary | ICD-10-CM | POA: Insufficient documentation

## 2014-08-13 DIAGNOSIS — R1031 Right lower quadrant pain: Secondary | ICD-10-CM

## 2014-08-13 DIAGNOSIS — R112 Nausea with vomiting, unspecified: Secondary | ICD-10-CM

## 2014-08-13 DIAGNOSIS — N200 Calculus of kidney: Secondary | ICD-10-CM | POA: Insufficient documentation

## 2014-08-13 DIAGNOSIS — J449 Chronic obstructive pulmonary disease, unspecified: Secondary | ICD-10-CM | POA: Insufficient documentation

## 2014-08-13 LAB — URINALYSIS, ROUTINE W REFLEX MICROSCOPIC
Bilirubin Urine: NEGATIVE
Glucose, UA: NEGATIVE mg/dL
KETONES UR: NEGATIVE mg/dL
Leukocytes, UA: NEGATIVE
NITRITE: NEGATIVE
PH: 5.5 (ref 5.0–8.0)
Protein, ur: NEGATIVE mg/dL
Specific Gravity, Urine: 1.025 (ref 1.005–1.030)
Urobilinogen, UA: 0.2 mg/dL (ref 0.0–1.0)

## 2014-08-13 LAB — COMPREHENSIVE METABOLIC PANEL
ALBUMIN: 4.5 g/dL (ref 3.5–5.0)
ALT: 28 U/L (ref 14–54)
AST: 23 U/L (ref 15–41)
Alkaline Phosphatase: 114 U/L (ref 38–126)
Anion gap: 9 (ref 5–15)
BUN: 11 mg/dL (ref 6–20)
CO2: 24 mmol/L (ref 22–32)
CREATININE: 0.89 mg/dL (ref 0.44–1.00)
Calcium: 8.9 mg/dL (ref 8.9–10.3)
Chloride: 107 mmol/L (ref 101–111)
GFR calc Af Amer: >60 mL/min (ref >60)
GFR calc non Af Amer: >60 mL/min (ref >60)
Glucose, Bld: 222 mg/dL — ABNORMAL HIGH (ref 65–99)
POTASSIUM: 3.4 mmol/L — AB (ref 3.5–5.1)
Sodium: 140 mmol/L (ref 135–145)
Total Bilirubin: 0.7 mg/dL (ref 0.3–1.2)
Total Protein: 7.5 g/dL (ref 6.5–8.1)

## 2014-08-13 LAB — CBC WITH DIFFERENTIAL/PLATELET
BASOS PCT: 1 % (ref 0–1)
Basophils Absolute: 0.1 10*3/uL (ref 0.0–0.1)
Eosinophils Absolute: 0.2 10*3/uL (ref 0.0–0.7)
Eosinophils Relative: 1 % (ref 0–5)
HCT: 41.4 % (ref 36.0–46.0)
Hemoglobin: 14 g/dL (ref 12.0–15.0)
LYMPHS PCT: 17 % (ref 12–46)
Lymphs Abs: 2.7 10*3/uL (ref 0.7–4.0)
MCH: 31.6 pg (ref 26.0–34.0)
MCHC: 33.8 g/dL (ref 30.0–36.0)
MCV: 93.5 fL (ref 78.0–100.0)
MONOS PCT: 8 % (ref 3–12)
Monocytes Absolute: 1.3 10*3/uL — ABNORMAL HIGH (ref 0.1–1.0)
Neutro Abs: 11.6 10*3/uL — ABNORMAL HIGH (ref 1.7–7.7)
Neutrophils Relative %: 73 % (ref 43–77)
PLATELETS: 356 10*3/uL (ref 150–400)
RBC: 4.43 MIL/uL (ref 3.87–5.11)
RDW: 12 % (ref 11.5–15.5)
WBC: 15.9 10*3/uL — ABNORMAL HIGH (ref 4.0–10.5)

## 2014-08-13 LAB — LIPASE, BLOOD: Lipase: 20 U/L — ABNORMAL LOW (ref 22–51)

## 2014-08-13 LAB — URINE MICROSCOPIC-ADD ON

## 2014-08-13 LAB — POC URINE PREG, ED: Preg Test, Ur: NEGATIVE

## 2014-08-13 MED ORDER — HYDROCODONE-ACETAMINOPHEN 5-325 MG PO TABS: 1.0000 | ORAL_TABLET | Freq: Four times a day (QID) | ORAL | Status: DC | PRN

## 2014-08-13 MED ORDER — MORPHINE SULFATE 4 MG/ML IJ SOLN
4.0000 mg | Freq: Once | INTRAMUSCULAR | Status: AC
  Administered 2014-08-13: 4 mg via INTRAVENOUS
  Filled 2014-08-13: qty 1

## 2014-08-13 MED ORDER — ONDANSETRON HCL 8 MG PO TABS: 8.0000 mg | ORAL_TABLET | Freq: Three times a day (TID) | ORAL | Status: DC | PRN

## 2014-08-13 MED ORDER — NAPROXEN 500 MG PO TABS: 500.0000 mg | ORAL_TABLET | Freq: Two times a day (BID) | ORAL | Status: DC | PRN

## 2014-08-13 MED ORDER — IOHEXOL 300 MG/ML  SOLN
100.0000 mL | Freq: Once | INTRAMUSCULAR | Status: AC | PRN
  Administered 2014-08-13: 100 mL via INTRAVENOUS

## 2014-08-13 MED ORDER — ONDANSETRON HCL 4 MG/2ML IJ SOLN
4.0000 mg | Freq: Once | INTRAMUSCULAR | Status: AC
  Administered 2014-08-13: 4 mg via INTRAVENOUS
  Filled 2014-08-13: qty 2

## 2014-08-13 MED ORDER — SODIUM CHLORIDE 0.9 % IV BOLUS (SEPSIS)
1000.0000 mL | Freq: Once | INTRAVENOUS | Status: AC
  Administered 2014-08-13: 1000 mL via INTRAVENOUS

## 2014-08-13 MED ORDER — IOHEXOL 300 MG/ML  SOLN
50.0000 mL | Freq: Once | INTRAMUSCULAR | Status: AC | PRN
  Administered 2014-08-13: 50 mL via ORAL

## 2014-08-13 NOTE — ED Notes (Signed)
Patient transported to CT 

## 2014-08-13 NOTE — Progress Notes (Signed)
Pt seen by [facility] staff Pt states she has been seen by [facility] medicine of [place] [facility] Has an upcoming appt in July 2016.  Pt states she did not sign up for affordable care act Cm encouraged her to sign up Cm reviewed medication resources for self pay/uninsured patients like needymeds.org, [place] med assist and good http://www.cox-reed.biz/ when pt discussed having trouble with getting her lisinopril.  Female at bedside states he will be soon placing pt under his insurance.  Cm encouraged pt to utilized the available self pay/uninsured resources in transition  CM spoke with pt who confirms self pay [facility] resident with no pcp.  CM discussed and provided written information for self pay pcps, discussed the importance of pcp vs EDP services for f/u care, www.needymeds.org, www.goodrx.com, discounted pharmacies and other [employer] such as [employer] , [employer], affordable care act,  [place] med assist, financial assistance, self pay dental services, [place] med assist, DSS and  health department  Reviewed resources for [employer] self pay pcps like [facility], family medicine at [facility], [facility], [facility], local urgent care centers, [facility], [facility] family practice, general medical clinics, family services of the [place], [facility] urgent care plus others, medication resources, [facility] out patient pharmacies and housing Pt voiced understanding and appreciation of resources provided

## 2014-08-13 NOTE — ED Notes (Signed)
Per  EMS- Patient c/o RLQ pain rates 10/10 and vomiting since this AM.

## 2014-08-13 NOTE — Discharge Instructions (Signed)
Take naprosyn as directed as needed for inflammation and pain using norco for breakthrough pain. Do not drive or operate machinery with pain medication use. May need over-the-counter stool softener with this pain medication use. Use Zofran as needed for nausea. Followup with urologist in the next 1 to 2 weeks for recheck of ongoing pain, however for intractable or uncontrollable pain at home then return to the emergency department.    Kidney Stones Kidney stones (urolithiasis) are solid masses that form inside your kidneys. The intense pain is caused by the stone moving through the kidney, ureter, bladder, and urethra (urinary tract). When the stone moves, the ureter starts to spasm around the stone. The stone is usually passed in your pee (urine).  HOME CARE  Drink enough fluids to keep your pee clear or pale yellow. This helps to get the stone out.  Strain all pee through the provided strainer. Do not pee without peeing through the strainer, not even once. If you pee the stone out, catch it in the strainer. The stone may be as small as a grain of salt. Take this to your doctor. This will help your doctor figure out what you can do to try to prevent more kidney stones.  Only take medicine as told by your doctor.  Follow up with your doctor as told.  Get follow-up X-rays as told by your doctor. GET HELP IF: You have pain that gets worse even if you have been taking pain medicine. GET HELP RIGHT AWAY IF:   Your pain does not get better with medicine.  You have a fever or shaking chills.  Your pain increases and gets worse over 18 hours.  You have new belly (abdominal) pain.  You feel faint or pass out.  You are unable to pee. MAKE SURE YOU:   Understand these instructions.  Will watch your condition.  Will get help right away if you are not doing well or get worse. Document Released: 07/28/2007 Document Revised: 10/11/2012 Document Reviewed: 07/12/2012 [facility] Patient  Information 2015 [place], [place]. This information is not intended to replace advice given to you by your health care provider. Make sure you discuss any questions you have with your health care provider.  Dietary Guidelines to Help Prevent Kidney Stones Your risk of kidney stones can be decreased by adjusting the foods you eat. The most important thing you can do is drink enough fluid. You should drink enough fluid to keep your urine clear or pale yellow. The following guidelines provide specific information for the type of kidney stone you have had. GUIDELINES ACCORDING TO TYPE OF KIDNEY STONE Calcium Oxalate Kidney Stones  Reduce the amount of salt you eat. Foods that have a lot of salt cause your body to release excess calcium into your urine. The excess calcium can combine with a substance called oxalate to form kidney stones.  Reduce the amount of animal protein you eat if the amount you eat is excessive. Animal protein causes your body to release excess calcium into your urine. Ask your dietitian how much protein from animal sources you should be eating.  Avoid foods that are high in oxalates. If you take vitamins, they should have less than 500 mg of vitamin C. Your body turns vitamin C into oxalates. You do not need to avoid fruits and vegetables high in vitamin C. Calcium Phosphate Kidney Stones  Reduce the amount of salt you eat to help prevent the release of excess calcium into your urine.  Reduce the  amount of animal protein you eat if the amount you eat is excessive. Animal protein causes your body to release excess calcium into your urine. Ask your dietitian how much protein from animal sources you should be eating.  Get enough calcium from food or take a calcium supplement (ask your dietitian for recommendations). Food sources of calcium that do not increase your risk of kidney stones include:  Broccoli.  Dairy products, such as cheese and yogurt.  Pudding. Uric Acid Kidney  Stones  Do not have more than 6 oz of animal protein per day. FOOD SOURCES Animal Protein Sources  Meat (all types).  Poultry.  Eggs.  Fish, seafood. Foods High in [employer] seasonings.  Soy sauce.  Teriyaki sauce.  Cured and processed meats.  Salted crackers and snack foods.  Fast food.  Canned soups and most canned foods. Foods High in Oxalates  Grains:  Amaranth.  Barley.  Grits.  Wheat germ.  Bran.  Buckwheat flour.  All bran cereals.  Pretzels.  Whole wheat bread.  Vegetables:  Beans (wax).  Beets and beet greens.  Collard greens.  Eggplant.  [place]scarole.  Leeks.  Okra.  Parsley.  Rutabagas.  Spinach.  Swiss chard.  Tomato paste.  Fried potatoes.  Sweet potatoes.  Fruits:  Red currants.  Figs.  Kiwi.  Rhubarb.  Meat and Other Protein Sources:  Beans (dried).  Soy burgers and other soybean products.  Miso.  Nuts (peanuts, almonds, pecans, cashews, hazelnuts).  Nut butters.  Sesame seeds and tahini (paste made of sesame seeds).  Poppy seeds.  Beverages:  Chocolate drink mixes.  Soy milk.  Instant iced tea.  Juices made from high-oxalate fruits or vegetables.  Other:  Carob.  Chocolate.  Fruitcake.  Marmalades. Document Released: 06/05/2010 Document Revised: 02/13/2013 Document Reviewed: 01/05/2013 [facility] Patient Information 2015 [place], [place]. This information is not intended to replace advice given to you by your health care provider. Make sure you discuss any questions you have with your health care provider.   Low-Purine Diet Purines are compounds that affect the level of uric acid in your body. A low-purine diet is a diet that is low in purines. Eating a low-purine diet can prevent the level of uric acid in your body from getting too high and causing gout or kidney stones or both. WHAT DO I NEED TO KNOW ABOUT THIS DIET?  Choose low-purine foods. Examples of low-purine foods  are listed in the next section.  Drink plenty of fluids, especially water. Fluids can help remove uric acid from your body. Try to drink 8-16 cups (1.9-3.8 L) a day.  Limit foods high in fat, especially saturated fat, as fat makes it harder for the body to get rid of uric acid. Foods high in saturated fat include pizza, cheese, ice cream, whole milk, fried foods, and gravies. Choose foods that are lower in fat and lean sources of protein. Use olive oil when cooking as it contains healthy fats that are not high in saturated fat.  Limit alcohol. Alcohol interferes with the elimination of uric acid from your body. If you are having a gout attack, avoid all alcohol.  Keep in mind that different people's bodies react differently to different foods. You will probably learn over time which foods do or do not affect you. If you discover that a food tends to cause your gout to flare up, avoid eating that food. You can more freely enjoy foods that do not cause problems. If you have any questions  about a food item, talk to your dietitian or health care provider. WHICH FOODS ARE LOW, MODERATE, AND HIGH IN PURINES? The following is a list of foods that are low, moderate, and high in purines. You can eat any amount of the foods that are low in purines. You may be able to have small amounts of foods that are moderate in purines. Ask your health care provider how much of a food moderate in purines you can have. Avoid foods high in purines. Grains  Foods low in purines: Enriched white bread, pasta, rice, cake, cornbread, popcorn.  Foods moderate in purines: Whole-grain breads and cereals, wheat germ, bran, oatmeal. Uncooked oatmeal. Dry wheat bran or wheat germ.  Foods high in purines: Pancakes, [place] toast, biscuits, muffins. Vegetables  Foods low in purines: All vegetables, except those that are moderate in purines.  Foods moderate in purines: Asparagus, cauliflower, spinach, mushrooms, green  peas. Fruits  All fruits are low in purines. Meats and other Protein Foods  Foods low in purines: Eggs, nuts, peanut butter.  Foods moderate in purines: 80-90% lean beef, lamb, veal, pork, poultry, fish, eggs, peanut butter, nuts. Crab, lobster, oysters, and shrimp. Cooked dried beans, peas, and lentils.  Foods high in purines: Anchovies, sardines, herring, mussels, tuna, codfish, scallops, trout, and haddock. Tomasa Blase. Organ meats (such as liver or kidney). Tripe. Game meat. Goose. Sweetbreads. Dairy  All dairy foods are low in purines. Low-fat and fat-free dairy products are best because they are low in saturated fat. Beverages  Drinks low in purines: Water, carbonated beverages, tea, coffee, cocoa.  Drinks moderate in purines: Soft drinks and other drinks sweetened with high-fructose corn syrup. Juices. To find whether a food or drink is sweetened with high-fructose corn syrup, look at the ingredients list.  Drinks high in purines: Alcoholic beverages (such as beer). Condiments  Foods low in purines: Salt, herbs, olives, pickles, relishes, vinegar.  Foods moderate in purines: Butter, margarine, oils, mayonnaise. Fats and Oils  Foods low in purines: All types, except gravies and sauces made with meat.  Foods high in purines: Gravies and sauces made with meat. Other Foods  Foods low in purines: Sugars, sweets, gelatin. Cake. Soups made without meat.  Foods moderate in purines: Meat-based or fish-based soups, broths, or bouillons. Foods and drinks sweetened with high-fructose corn syrup.  Foods high in purines: High-fat desserts (such as ice cream, cookies, cakes, pies, doughnuts, and chocolate). Contact your dietitian for more information on foods that are not listed here. Document Released: 06/05/2010 Document Revised: 02/13/2013 Document Reviewed: 01/15/2013 [facility] Patient Information 2015 [place], [place]. This information is not intended to replace advice given to you by  your health care provider. Make sure you discuss any questions you have with your health care provider.

## 2014-08-13 NOTE — ED Provider Notes (Signed)
CSN: [id number]     Arrival date & time 08/13/14  1041 History   First MD Initiated Contact with Patient 08/13/14 1048     No chief complaint on file.    (Consider location/radiation/quality/duration/timing/severity/associated sxs/prior Treatment) HPI Comments: Christine Mueller is a 39 y.o. female with a PMHx of HTN, COPD, and asthma, who presents to the ED with complaints of sudden onset right flank pain that began earlier this morning, although she is unsure of exactly what time. She reports that the pain is 10/10 constant cramping radiating to the right lower quadrant, with no known aggravating or alleviating factors given that she has not tried anything prior to arrival. Associated symptoms include increased urinary frequency, nausea, and 3 episodes of nonbloody nonbilious emesis prior to arrival. She denies any fevers, chills, chest pain, shortness of breath, diarrhea, constipation, melanotic, hematochezia, obstipation, dysuria, hematuria, vaginal bleeding or discharge, numbness, tingling, weakness, recent travel, sick contacts, antibiotic use, NSAIDs, alcohol use, suspicious food intake, or prior abdominal surgeries. LMP was sometime last month, she is unsure of exactly what date.  Patient is a 39 y.o. female presenting with abdominal pain. The history is provided by the patient. No language interpreter was used.  Abdominal Pain Pain location:  R flank Pain quality: cramping   Pain radiates to:  RLQ Pain severity:  Severe Onset quality:  Sudden Duration: this morning, unsure exact time. Timing:  Constant Progression:  Unchanged Chronicity:  New Context: not alcohol use, not recent travel, not sick contacts, not suspicious food intake and not trauma   Relieved by:  None tried Worsened by:  Nothing tried Ineffective treatments:  None tried Associated symptoms: nausea and vomiting   Associated symptoms: no chest pain, no chills, no constipation, no diarrhea, no dysuria, no fever, no  flatus, no hematemesis, no hematochezia, no hematuria, no melena, no shortness of breath, no vaginal bleeding and no vaginal discharge   Risk factors: no alcohol abuse, has not had multiple surgeries and no NSAID use     Past Medical History  Diagnosis Date  . Hypertension   . COPD (chronic obstructive pulmonary disease)   . Asthma    No past surgical history on file. No family history on file. History  Substance Use Topics  . Smoking status: Current Every Day Smoker -- 1.00 packs/day for 15 years    Types: Cigarettes  . Smokeless tobacco: Not on file  . Alcohol Use: No   OB History    No data available     Review of Systems  Constitutional: Negative for fever and chills.  Respiratory: Negative for shortness of breath.   Cardiovascular: Negative for chest pain.  Gastrointestinal: Positive for nausea, vomiting and abdominal pain. Negative for diarrhea, constipation, blood in stool, melena, hematochezia, flatus and hematemesis.  Genitourinary: Positive for frequency and flank pain. Negative for dysuria, hematuria, vaginal bleeding and vaginal discharge.  Musculoskeletal: Negative for myalgias and arthralgias.  Skin: Negative for color change.  Allergic/Immunologic: Negative for immunocompromised state.  Neurological: Negative for weakness and numbness.  Psychiatric/Behavioral: Negative for confusion.   10 Systems reviewed and are negative for acute change except as noted in the HPI.    Allergies  Penicillins  Home Medications   Prior to Admission medications   Medication Sig Start Date End Date Taking? Authorizing Provider  albuterol (PROVENTIL HFA;VENTOLIN HFA) 108 (90 BASE) MCG/ACT inhaler Inhale 2 puffs into the lungs every 6 (six) hours as needed. weezing     Historical Provider, MD  lisinopril (  PRINIVIL,ZESTRIL) 20 MG tablet Take 20 mg by mouth daily.    Historical Provider, MD  Prenatal Vit-Fe Fumarate-FA (MULTIVITAMIN-PRENATAL) 27-0.8 MG TABS Take 1 tablet by mouth  daily.      Historical Provider, MD   BP 185/90 mmHg  Pulse 53  Temp(Src) 97.9 F (36.6 C) (Oral)  Resp 20  SpO2 100% Physical Exam  Constitutional: She is oriented to person, place, and time. Vital signs are normal. She appears well-developed and well-nourished.  Non-toxic appearance. She appears distressed.  Afebrile, nontoxic, writhing around, actively vomiting. Mildly hypertensive.  HENT:  Head: Normocephalic and atraumatic.  Mouth/Throat: Oropharynx is clear and moist and mucous membranes are normal.  Eyes: Conjunctivae and EOM are normal. Right eye exhibits no discharge. Left eye exhibits no discharge.  Neck: Normal range of motion. Neck supple.  Cardiovascular: Normal rate, regular rhythm, normal heart sounds and intact distal pulses.  Exam reveals no gallop and no friction rub.   No murmur heard. Pulmonary/Chest: Effort normal and breath sounds normal. No respiratory distress. She has no decreased breath sounds. She has no wheezes. She has no rhonchi. She has no rales.  Abdominal: Soft. Normal appearance and bowel sounds are normal. She exhibits no distension. There is tenderness in the right lower quadrant. There is CVA tenderness (mild R sided) and tenderness at McBurney's point. There is no rigidity, no rebound, no guarding and negative Murphy's sign.    Soft, nondistended, +BS throughout, with RLQ TTP, no r/g/r, neg murphy's, +mcburney's point tenderness, neg psoas sign, neg foot tap test, with trace R sided CVA TTP   Musculoskeletal: Normal range of motion.  Neurological: She is alert and oriented to person, place, and time. She has normal strength. No sensory deficit.  Skin: Skin is warm, dry and intact. No rash noted.  Psychiatric: She has a normal mood and affect.  Nursing note and vitals reviewed.   ED Course  Procedures (including critical care time) Labs Review Labs Reviewed  CBC WITH DIFFERENTIAL/PLATELET - Abnormal; Notable for the following:    WBC 15.9 (*)      Neutro Abs 11.6 (*)    Monocytes Absolute 1.3 (*)    All other components within normal limits  COMPREHENSIVE METABOLIC PANEL - Abnormal; Notable for the following:    Potassium 3.4 (*)    Glucose, Bld 222 (*)    All other components within normal limits  LIPASE, BLOOD - Abnormal; Notable for the following:    Lipase 20 (*)    All other components within normal limits  URINALYSIS, ROUTINE W REFLEX MICROSCOPIC (NOT AT [facility]) - Abnormal; Notable for the following:    Color, Urine AMBER (*)    APPearance CLOUDY (*)    Hgb urine dipstick LARGE (*)    All other components within normal limits  URINE MICROSCOPIC-ADD ON - Abnormal; Notable for the following:    Bacteria, UA FEW (*)    All other components within normal limits  POC URINE PREG, ED    Imaging Review Ct Abdomen Pelvis W Contrast  08/13/2014   CLINICAL DATA:  RIGHT lower quadrant abdominal tenderness, RIGHT flank pain since this morning, vomiting, negative pregnancy test, question appendicitis versus kidney stone, past history hypertension, COPD, asthma, smoker  EXAM: CT ABDOMEN AND PELVIS WITH CONTRAST  TECHNIQUE: Multidetector CT imaging of the abdomen and pelvis was performed using the standard protocol following bolus administration of intravenous contrast. Sagittal and coronal MPR images reconstructed from axial data set.  CONTRAST:  OMNIPAQUE IOHEXOL 300  MG/ML SOLN IV. Dilute oral contrast.  COMPARISON:  11/14/2007  FINDINGS: Minimal atelectasis RIGHT base.  Tiny BILATERAL nonobstructing renal calculi.  Tiny cyst inferior pole RIGHT kidney.  No hydronephrosis, hydroureter, or ureteral calcification.  Bladder adequately distended with single tiny calculus dependently question passed calculus.  Liver, spleen, gallbladder, pancreas, and adrenal glands normal.  Stomach and bowel loops normal appearance.  Normal appendix.  Normal appearing bladder, uterus and RIGHT adnexa a with small cyst in LEFT ovary 2.6 x 1.8 cm image 66.  No  mass, adenopathy, hernia, free fluid, or free air.  Osseous structures unremarkable.  IMPRESSION: Tiny calculus dependently within urinary bladder question passed ureteral calculus.  BILATERAL nonobstructing renal calculi without hydronephrosis or hydroureter.  Normal appendix.  Small LEFT ovarian cyst.   Electronically Signed   By: Ulyses Southward M.D.   On: 08/13/2014 12:25     EKG Interpretation None      MDM   Final diagnoses:  RLQ abdominal pain  Right flank pain  Nephrolithiasis  Non-intractable vomiting with nausea, vomiting of unspecified type    39 y.o. female here with sudden onset n/v and RLQ/R flank pain and urinary frequency. On exam, pt actively vomiting and tender in RLQ at mcburney's point, some mild R flank tenderness, concerning for appendicitis vs nephrolithiasis. No vaginal complaints, and at this time pelvic exam would be difficult to obtain, will proceed with labs and CT imaging first, if no results then may consider pelvic exam if pt more comfortable. Will give fluids, zofran, and morphine then reassess shortly.   12:30 PM CBC w/diff showing leukocytosis of 15.9 which could be demargination from stress response especially since differential is unremarkable. CMP showing mildly low K at 3.4, glucose 222 without anion gap. Lipase WNL. Upreg neg. U/A with TNTC RBC, nitrite and leuk negative, rare squamouse, few bacteria, and 0-2 WBC. Doubt UTI, likely passed kidney stone. CT abd/pelvis showing normal appendix, and tiny calculi bilaterally with a calculus in the bladder which raises suspicion for passed stone. This is likely the etiology of her symptoms today. Pain and nausea completely resolved, will PO challenge now. Will d/c home with pain meds, nausea meds, and instructions on dietary guidelines to prevent kidney stones. Will have her f/up with urology in 1-2wks for recheck of symptoms.   12:41 PM Tolerating PO well. I explained the diagnosis and have given explicit  precautions to return to the ER including for any other new or worsening symptoms. The patient understands and accepts the medical plan as it's been dictated and I have answered their questions. Discharge instructions concerning home care and prescriptions have been given. The patient is STABLE and is discharged to home in good condition.  BP 130/76 mmHg  Pulse 84  Temp(Src) 97.9 F (36.6 C) (Oral)  Resp 20  SpO2 100%  LMP 07/09/2014  Meds ordered this encounter  Medications  . sodium chloride 0.9 % bolus 1,000 mL    Sig:   . ondansetron (ZOFRAN) injection 4 mg    Sig:   . morphine 4 MG/ML injection 4 mg    Sig:   . iohexol (OMNIPAQUE) 300 MG/ML solution 50 mL    Sig:   . iohexol (OMNIPAQUE) 300 MG/ML solution 100 mL    Sig:   . naproxen (NAPROSYN) 500 MG tablet    Sig: Take 1 tablet (500 mg total) by mouth 2 (two) times daily as needed for mild pain, moderate pain or headache (TAKE WITH MEALS.).    Dispense:  20 tablet    Refill:  0    Order Specific Question:  Supervising Provider    Answer:  MILLER, BRIAN [[id number]]  . HYDROcodone-acetaminophen (NORCO) 5-325 MG per tablet    Sig: Take 1 tablet by mouth every 6 (six) hours as needed for severe pain.    Dispense:  6 tablet    Refill:  0    Order Specific Question:  Supervising Provider    Answer:  MILLER, BRIAN [[id number]]  . ondansetron (ZOFRAN) 8 MG tablet    Sig: Take 1 tablet (8 mg total) by mouth every 8 (eight) hours as needed for nausea or vomiting.    Dispense:  10 tablet    Refill:  0    Order Specific Question:  Supervising Provider    Answer:  Eber Hong [[id number]]     Oreta Soloway Camprubi-Soms, PA-C 08/13/14 1241  Benjiman Core, MD 08/14/14 [phone]

## 2014-08-13 NOTE — ED Notes (Signed)
Bed: KS08 Expected date:  Expected time:  Means of arrival:  Comments: EMS- 38yo F, emesis

## 2015-01-22 ENCOUNTER — Encounter: Payer: Self-pay | Admitting: *Deleted

## 2015-01-22 ENCOUNTER — Ambulatory Visit (INDEPENDENT_AMBULATORY_CARE_PROVIDER_SITE_OTHER): Payer: Self-pay | Admitting: *Deleted

## 2015-01-22 DIAGNOSIS — Z3201 Encounter for pregnancy test, result positive: Secondary | ICD-10-CM

## 2015-01-22 NOTE — Progress Notes (Signed)
Pt had positive pregnancy test, desires to start care here. Labs drawn and first trimester screen scheduled. Patient has certain lmp of 12/03/14.

## 2015-01-23 LAB — PRENATAL PROFILE (SOLSTAS)
ANTIBODY SCREEN: NEGATIVE
BASOS ABS: 0 10*3/uL (ref 0.0–0.1)
Basophils Relative: 0 % (ref 0–1)
Eosinophils Absolute: 0.3 10*3/uL (ref 0.0–0.7)
Eosinophils Relative: 4 % (ref 0–5)
HEMATOCRIT: 35.4 % — AB (ref 36.0–46.0)
HEMOGLOBIN: 11.9 g/dL — AB (ref 12.0–15.0)
HEP B S AG: NEGATIVE
HIV 1&2 Ab, 4th Generation: NONREACTIVE
LYMPHS ABS: 3.2 10*3/uL (ref 0.7–4.0)
Lymphocytes Relative: 37 % (ref 12–46)
MCH: 31.2 pg (ref 26.0–34.0)
MCHC: 33.6 g/dL (ref 30.0–36.0)
MCV: 92.7 fL (ref 78.0–100.0)
MONO ABS: 0.8 10*3/uL (ref 0.1–1.0)
MPV: 8.9 fL (ref 8.6–12.4)
Monocytes Relative: 9 % (ref 3–12)
Neutro Abs: 4.4 10*3/uL (ref 1.7–7.7)
Neutrophils Relative %: 50 % (ref 43–77)
Platelets: 311 10*3/uL (ref 150–400)
RBC: 3.82 MIL/uL — AB (ref 3.87–5.11)
RDW: 12.3 % (ref 11.5–15.5)
RUBELLA: 11.9 {index} — AB (ref <0.90)
Rh Type: NEGATIVE
WBC: 8.7 10*3/uL (ref 4.0–10.5)

## 2015-01-23 LAB — CULTURE, OB URINE
COLONY COUNT: NO GROWTH
Organism ID, Bacteria: NO GROWTH

## 2015-01-27 LAB — CANNABANOIDS (GC/LC/MS), URINE: THC-COOH UR CONFIRM: 432 ng/mL — AB (ref <5)

## 2015-01-28 LAB — PRESCRIPTION MONITORING PROFILE (19 PANEL)
Amphetamine/Meth: NEGATIVE ng/mL
BUPRENORPHINE, URINE: NEGATIVE ng/mL
Barbiturate Screen, Urine: NEGATIVE ng/mL
Benzodiazepine Screen, Urine: NEGATIVE ng/mL
CARISOPRODOL, URINE: NEGATIVE ng/mL
COCAINE METABOLITES: NEGATIVE ng/mL
Creatinine, Urine: 254.15 mg/dL (ref >20.0)
ECSTASY: NEGATIVE ng/mL
FENTANYL URINE: NEGATIVE ng/mL
Meperidine, Ur: NEGATIVE ng/mL
Methadone Screen, Urine: NEGATIVE ng/mL
Methaqualone: NEGATIVE ng/mL
Nitrites, Initial: NEGATIVE ug/mL
OXYCODONE SCRN UR: NEGATIVE ng/mL
Opiate Screen, Urine: NEGATIVE ng/mL
PROPOXYPHENE: NEGATIVE ng/mL
Phencyclidine, Ur: NEGATIVE ng/mL
TRAMADOL UR: NEGATIVE ng/mL
Tapentadol, urine: NEGATIVE ng/mL
ZOLPIDEM, URINE: NEGATIVE ng/mL
pH, Initial: 6.8 pH (ref 4.5–8.9)

## 2015-01-30 ENCOUNTER — Inpatient Hospital Stay (HOSPITAL_COMMUNITY)
Admission: AD | Admit: 2015-01-30 | Discharge: 2015-01-30 | Disposition: A | Discharge status: Home / Self Care | Payer: Medicaid Other | Source: Ambulatory Visit | Attending: Obstetrics and Gynecology | Admitting: Obstetrics and Gynecology

## 2015-01-30 ENCOUNTER — Encounter (HOSPITAL_COMMUNITY): Payer: Self-pay | Admitting: *Deleted

## 2015-01-30 DIAGNOSIS — O26899 Other specified pregnancy related conditions, unspecified trimester: Secondary | ICD-10-CM

## 2015-01-30 DIAGNOSIS — O10911 Unspecified pre-existing hypertension complicating pregnancy, first trimester: Secondary | ICD-10-CM | POA: Diagnosis not present

## 2015-01-30 DIAGNOSIS — Z3A08 8 weeks gestation of pregnancy: Secondary | ICD-10-CM | POA: Insufficient documentation

## 2015-01-30 DIAGNOSIS — R51 Headache: Secondary | ICD-10-CM | POA: Diagnosis present

## 2015-01-30 DIAGNOSIS — R1084 Generalized abdominal pain: Secondary | ICD-10-CM

## 2015-01-30 DIAGNOSIS — O26891 Other specified pregnancy related conditions, first trimester: Secondary | ICD-10-CM

## 2015-01-30 DIAGNOSIS — R109 Unspecified abdominal pain: Secondary | ICD-10-CM | POA: Insufficient documentation

## 2015-01-30 DIAGNOSIS — O131 Gestational [pregnancy-induced] hypertension without significant proteinuria, first trimester: Secondary | ICD-10-CM

## 2015-01-30 LAB — URINALYSIS, ROUTINE W REFLEX MICROSCOPIC
Bilirubin Urine: NEGATIVE
GLUCOSE, UA: NEGATIVE mg/dL
HGB URINE DIPSTICK: NEGATIVE
KETONES UR: 15 mg/dL — AB
LEUKOCYTES UA: NEGATIVE
Nitrite: NEGATIVE
PH: 6 (ref 5.0–8.0)
Protein, ur: NEGATIVE mg/dL
Specific Gravity, Urine: 1.025 (ref 1.005–1.030)

## 2015-01-30 LAB — POCT PREGNANCY, URINE: Preg Test, Ur: POSITIVE — AB

## 2015-01-30 MED ORDER — LABETALOL HCL 100 MG PO TABS
100.0000 mg | ORAL_TABLET | Freq: Once | ORAL | Status: AC
  Administered 2015-01-30: 100 mg via ORAL
  Filled 2015-01-30: qty 1

## 2015-01-30 MED ORDER — LABETALOL HCL 100 MG PO TABS: 100.0000 mg | ORAL_TABLET | Freq: Two times a day (BID) | ORAL | Status: DC

## 2015-01-30 MED ORDER — PROMETHAZINE HCL 25 MG PO TABS: 25.0000 mg | ORAL_TABLET | Freq: Four times a day (QID) | ORAL | Status: DC | PRN

## 2015-01-30 MED ORDER — ACETAMINOPHEN 325 MG PO TABS
650.0000 mg | ORAL_TABLET | Freq: Once | ORAL | Status: AC
  Administered 2015-01-30: 650 mg via ORAL
  Filled 2015-01-30: qty 2

## 2015-01-30 NOTE — Discharge Instructions (Signed)
Hypertension During Pregnancy °Hypertension, or high blood pressure, is when there is extra pressure inside your blood vessels that carry blood from the heart to the rest of your body (arteries). It can happen at any time in life, including pregnancy. Hypertension during pregnancy can cause problems for you and your baby. Your baby might not weigh as much as he or she should at birth or might be born early (premature). Very bad cases of hypertension during pregnancy can be life-threatening.  °Different types of hypertension can occur during pregnancy. These include: °· Chronic hypertension. This happens when a woman has hypertension before pregnancy and it continues during pregnancy. °· Gestational hypertension. This is when hypertension develops during pregnancy. °· Preeclampsia or toxemia of pregnancy. This is a very serious type of hypertension that develops only during pregnancy. It affects the whole body and can be very dangerous for both mother and baby.   °Gestational hypertension and preeclampsia usually go away after your baby is born. Your blood pressure will likely stabilize within 6 weeks. Women who have hypertension during pregnancy have a greater chance of developing hypertension later in life or with future pregnancies. °RISK FACTORS °There are certain factors that make it more likely for you to develop hypertension during pregnancy. These include: °· Having hypertension before pregnancy. °· Having hypertension during a previous pregnancy. °· Being overweight. °· Being older than 40 years. °· Being pregnant with more than one baby. °· Having diabetes or kidney problems. °SIGNS AND SYMPTOMS °Chronic and gestational hypertension rarely cause symptoms. Preeclampsia has symptoms, which may include: °· Increased protein in your urine. Your health care provider will check for this at every prenatal visit. °· Swelling of your hands and face. °· Rapid weight gain. °· Headaches. °· Visual changes. °· Being  bothered by light. °· Abdominal pain, especially in the upper right area. °· Chest pain. °· Shortness of breath. °· Increased reflexes. °· Seizures. These occur with a more severe form of preeclampsia, called eclampsia. °DIAGNOSIS  °You may be diagnosed with hypertension during a regular prenatal exam. At each prenatal visit, you may have: °· Your blood pressure checked. °· A urine test to check for protein in your urine. °The type of hypertension you are diagnosed with depends on when you developed it. It also depends on your specific blood pressure reading. °· Developing hypertension before 20 weeks of pregnancy is consistent with chronic hypertension. °· Developing hypertension after 20 weeks of pregnancy is consistent with gestational hypertension. °· Hypertension with increased urinary protein is diagnosed as preeclampsia. °· Blood pressure measurements that stay above 160 systolic or 110 diastolic are a sign of severe preeclampsia. °TREATMENT °Treatment for hypertension during pregnancy varies. Treatment depends on the type of hypertension and how serious it is. °· If you take medicine for chronic hypertension, you may need to switch medicines. °¨ Medicines called ACE inhibitors should not be taken during pregnancy. °¨ Low-dose aspirin may be suggested for women who have risk factors for preeclampsia. °· If you have gestational hypertension, you may need to take a blood pressure medicine that is safe during pregnancy. Your health care provider will recommend the correct medicine. °· If you have severe preeclampsia, you may need to be in the hospital. Health care providers will watch you and your baby very closely. You also may need to take medicine called magnesium sulfate to prevent seizures and lower blood pressure. °· Sometimes, an early delivery is needed. This may be the case if the condition worsens. It would be   done to protect you and your baby. The only cure for preeclampsia is delivery.  Your health  care provider may recommend that you take one low-dose aspirin (81 mg) each day to help prevent high blood pressure during your pregnancy if you are at risk for preeclampsia. You may be at risk for preeclampsia if:  You had preeclampsia or eclampsia during a previous pregnancy.  Your baby did not grow as expected during a previous pregnancy.  You experienced preterm birth with a previous pregnancy.  You experienced a separation of the placenta from the uterus (placental abruption) during a previous pregnancy.  You experienced the loss of your baby during a previous pregnancy.  You are pregnant with more than one baby.  You have other medical conditions, such as diabetes or an autoimmune disease. HOME CARE INSTRUCTIONS  Schedule and keep all of your regular prenatal care appointments. This is important.  Take medicines only as directed by your health care provider. Tell your health care provider about all medicines you take.  Eat as little salt as possible.  Get regular exercise.  Do not drink alcohol.  Do not use tobacco products.  Do not drink products with caffeine.  Lie on your left side when resting. SEEK IMMEDIATE MEDICAL CARE IF:  You have severe abdominal pain.  You have sudden swelling in your hands, ankles, or face.  You gain 4 pounds (1.8 kg) or more in 1 week.  You vomit repeatedly.  You have vaginal bleeding.  You do not feel your baby moving as much.  You have a headache.  You have blurred or double vision.  You have muscle twitching or spasms.  You have shortness of breath.  You have blue fingernails or lips.  You have blood in your urine. MAKE SURE YOU:  Understand these instructions.  Will watch your condition.  Will get help right away if you are not doing well or get worse.   This information is not intended to replace advice given to you by your health care provider. Make sure you discuss any questions you have with your health care  provider.   Document Released: 10/27/2010 Document Revised: 03/01/2014 Document Reviewed: 09/07/2012 Elsevier Interactive Patient Education 2016 [employer]   Abdominal Pain During Pregnancy Abdominal pain is common in pregnancy. Most of the time, it does not cause harm. There are many causes of abdominal pain. Some causes are more serious than others. Some of the causes of abdominal pain in pregnancy are easily diagnosed. Occasionally, the diagnosis takes time to understand. Other times, the cause is not determined. Abdominal pain can be a sign that something is very wrong with the pregnancy, or the pain may have nothing to do with the pregnancy at all. For this reason, always tell your health care provider if you have any abdominal discomfort. HOME CARE INSTRUCTIONS  Monitor your abdominal pain for any changes. The following actions may help to alleviate any discomfort you are experiencing:  Do not have sexual intercourse or put anything in your vagina until your symptoms go away completely.  Get plenty of rest until your pain improves.  Drink clear fluids if you feel nauseous. Avoid solid food as long as you are uncomfortable or nauseous.  Only take over-the-counter or prescription medicine as directed by your health care provider.  Keep all follow-up appointments with your health care provider. SEEK IMMEDIATE MEDICAL CARE IF:  You are bleeding, leaking fluid, or passing tissue from the vagina.  You have increasing pain  or cramping.  You have persistent vomiting.  You have painful or bloody urination.  You have a fever.  You notice a decrease in your baby's movements.  You have extreme weakness or feel faint.  You have shortness of breath, with or without abdominal pain.  You develop a severe headache with abdominal pain.  You have abnormal vaginal discharge with abdominal pain.  You have persistent diarrhea.  You have abdominal pain that continues even after rest,  or gets worse. MAKE SURE YOU:   Understand these instructions.  Will watch your condition.  Will get help right away if you are not doing well or get worse.   This information is not intended to replace advice given to you by your health care provider. Make sure you discuss any questions you have with your health care provider.   Document Released: 02/08/2005 Document Revised: 11/29/2012 Document Reviewed: 09/07/2012 Elsevier Interactive Patient Education [employer]

## 2015-01-30 NOTE — MAU Provider Note (Signed)
Chief Complaint: Headache and Abdominal Pain   First Provider Initiated Contact with Patient 01/30/15 1600     SUBJECTIVE HPI: Christine Mueller is a 39 y.o. G1P0 at [redacted]w[redacted]d who presents to Maternity Admissions reporting 1. Headache since stopping lisinopril for chronic hypertension on 01/22/2015. Stopped medication because of positive pregnancy test. Hasn't tried anything to treat headache.  2. Nausea and vomiting 1-2 times per day. None now 3. Pain along mid bilateral sides of abdomen since yesterday. States she needs an ultrasound, then reported 1 episode of low abdominal pain lasting a few seconds this morning.  Location: Generalized headache Quality: Throbbing Severity: 8/10 on pain scale Duration: One week Context: Since stopping blood pressure medicine. Timing: Constant Modifying factors: None hasn't tried anything to treat the pain. Associated signs and symptoms: Positive for nausea, vomiting. Negative for fever, chills, neck stiffness, difficulties with speech or gait, sinus congestion.   Location: Bilateral mid abdomen Quality: Cramping Severity: 5/10 on pain scale Duration: 24 hours Context: None.  Timing: Intermittent Modifying factors: Hasn't tried anything for pain. Associated signs and symptoms: Positive for nausea, vomiting. Negative for fever, chills, diarrhea, constipation, vaginal bleeding, vaginal discharge.  Past Medical History  Diagnosis Date  . Hypertension   . COPD (chronic obstructive pulmonary disease) (HCC)   . Asthma    OB History  Gravida Para Term Preterm AB SAB TAB Ectopic Multiple Living  1             # Outcome Date GA Lbr Len/2nd Weight Sex Delivery Anes PTL Lv  1 Current              Past Surgical History  Procedure Laterality Date  . No past surgeries     Social History   Social History  . Marital Status: Single    Spouse Name: N/A  . Number of Children: N/A  . Years of Education: N/A   Occupational History  . Not on file.    Social History Main Topics  . Smoking status: Current Every Day Smoker -- 1.00 packs/day for 15 years    Types: Cigarettes  . Smokeless tobacco: Never Used  . Alcohol Use: No  . Drug Use: No  . Sexual Activity: yes   Other Topics Concern  . Not on file   Social History Narrative   No current facility-administered medications on file prior to encounter.   Current Outpatient Prescriptions on File Prior to Encounter  Medication Sig Dispense Refill  . albuterol (PROVENTIL HFA;VENTOLIN HFA) 108 (90 BASE) MCG/ACT inhaler Inhale 2 puffs into the lungs every 6 (six) hours as needed for wheezing.      Allergies  Allergen Reactions  . Penicillins Swelling    Has patient had a PCN reaction causing immediate rash, facial/tongue/throat swelling, SOB or lightheadedness with hypotension: Yes Has patient had a PCN reaction causing severe rash involving mucus membranes or skin necrosis: No Has patient had a PCN reaction that required hospitalization Yes Has patient had a PCN reaction occurring within the last 10 years: No If all of the above answers are "NO", then may proceed with Cephalosporin use.     I have reviewed the past Medical Hx, Surgical Hx, Social Hx, Allergies and Medications.   Review of Systems  Constitutional: Negative for fever and chills.  HENT: Negative for sinus pressure.   Gastrointestinal: Positive for nausea, vomiting and abdominal pain. Negative for diarrhea, constipation and blood in stool.  Genitourinary: Negative for dysuria, frequency, hematuria, flank pain, vaginal bleeding, vaginal  discharge and pelvic pain.  Musculoskeletal: Negative for back pain, gait problem, neck pain and neck stiffness.  Neurological: Positive for headaches. Negative for dizziness, syncope, facial asymmetry, speech difficulty, weakness and numbness.    OBJECTIVE Patient Vitals for the past 24 hrs:  BP Temp Temp src Pulse Resp Height Weight  01/30/15 1701 146/67 mmHg - - 86 18 - -   01/30/15 1651 159/79 mmHg - - 84 - - -  01/30/15 1631 136/72 mmHg - - 84 18 - -  01/30/15 1621 138/68 mmHg - - 85 - - -  01/30/15 1619 147/68 mmHg - - 80 18 - -  01/30/15 1514 145/80 mmHg 98.4 F (36.9 C) Oral 95 18  (1.626 m) 164 lb (74.39 kg)   Constitutional: Well-developed, well-nourished female in no acute distress.  Cardiovascular: normal rate Respiratory: normal rate and effort.  GI: Abd mildly distended, mild tenderness along bilateral sides abdomen. No low abdominal tenderness, rebound tenderness. Pos BS x 4.  MS: Extremities nontender, no edema, normal ROM Neurologic: Alert and oriented x 4.  GU: Neg CVAT.  SPECULUM EXAM: Deferred  LAB RESULTS Results for orders placed or performed during the hospital encounter of 01/30/15 (from the past 24 hour(s))  Urinalysis, Routine w reflex microscopic (not at [facility])     Status: Abnormal   Collection Time: 01/30/15  3:20 PM  Result Value Ref Range   Color, Urine YELLOW YELLOW   APPearance CLEAR CLEAR   Specific Gravity, Urine 1.025 1.005 - 1.030   pH 6.0 5.0 - 8.0   Glucose, UA NEGATIVE NEGATIVE mg/dL   Hgb urine dipstick NEGATIVE NEGATIVE   Bilirubin Urine NEGATIVE NEGATIVE   Ketones, ur 15 (A) NEGATIVE mg/dL   Protein, ur NEGATIVE NEGATIVE mg/dL   Nitrite NEGATIVE NEGATIVE   Leukocytes, UA NEGATIVE NEGATIVE  Pregnancy, urine POC     Status: Abnormal   Collection Time: 01/30/15  3:27 PM  Result Value Ref Range   Preg Test, Ur POSITIVE (A) NEGATIVE    IMAGING Informal bedside ultrasound shows single live intrauterine pregnancy with crown-rump length consistent with 8 week 1 day gestation. Fetal heart rate 182. Yolk sac seen.   MAU COURSE UPT, Cycle blood pressures, UA, Tylenol, labetalol.  Headache and abdominal pain resolved completely.  MDM -39 year old female at 8 weeks 2 days with chronic hypertension and headache consistent with those the patient normally feels when off of blood pressure medication. Labetalol  started. No apparent emergent intracranial process. -Mid abdominal pain likely muscular and related to nausea and vomiting. Patient only reported low abdominal pain after CNM informed her that her uterus was not up in her midabdomen at this gestational age. Viable pregnancy verified by ultrasound.  ASSESSMENT 1. Headache in pregnancy, antepartum, first trimester   2. Hypertension in pregnancy, pre-existing, antepartum, first trimester   3. Abdominal pain affecting pregnancy, antepartum    PLAN Discharge home in stable condition. First trimester Precautions Headache red flags reviewed. Rx labetalol and Phenergan. Tylenol and comfort measures for headache     Follow-up Information    Follow up with [facility] On 03/12/2015.   Specialty:  Obstetrics and Gynecology   Why:  Start prenatal care   Contact information:   [address] [place] [address] [phone]      Follow up with [facility].   Why:  As needed in emergencies   Contact information:   [address] [id number] mc [place]  [id number] [phone]       Medication List    STOP taking these medications        HYDROcodone-acetaminophen 5-325 MG tablet  Commonly known as:  NORCO     lisinopril 20 MG tablet  Commonly known as:  PRINIVIL,ZESTRIL     naproxen 500 MG tablet  Commonly known as:  NAPROSYN     ondansetron 8 MG tablet  Commonly known as:  ZOFRAN      TAKE these medications        albuterol 108 (90 BASE) MCG/ACT inhaler  Commonly known as:  PROVENTIL HFA;VENTOLIN HFA  Inhale 2 puffs into the lungs every 6 (six) hours as needed for wheezing.     docusate sodium 100 MG capsule  Commonly known as:  COLACE  Take 100 mg by mouth daily as needed for mild constipation.     labetalol 100 MG tablet  Commonly known as:  NORMODYNE  Take 1 tablet (100 mg total) by mouth 2 (two) times daily.     prenatal  multivitamin Tabs tablet  Take 1 tablet by mouth daily at 12 noon.     promethazine 25 MG tablet  Commonly known as:  PHENERGAN  Take 1 tablet (25 mg total) by mouth every 6 (six) hours as needed.       [place] Nyela Cortinas, CNM 01/30/2015  6:15 PM

## 2015-01-30 NOTE — MAU Note (Signed)
Pt reports having a headache sine 11/30 and mid  abd pain since yesterday. Pt stated she is supposed to be on blood pressure medication but stopped taking last week when she found out she was pregnant .

## 2015-02-14 ENCOUNTER — Encounter (HOSPITAL_COMMUNITY): Payer: Self-pay | Admitting: *Deleted

## 2015-02-14 ENCOUNTER — Inpatient Hospital Stay (HOSPITAL_COMMUNITY)
Admission: AD | Admit: 2015-02-14 | Discharge: 2015-02-15 | Disposition: A | Discharge status: Home / Self Care | Payer: Medicaid Other | Source: Ambulatory Visit | Attending: Family Medicine | Admitting: Family Medicine

## 2015-02-14 DIAGNOSIS — O99611 Diseases of the digestive system complicating pregnancy, first trimester: Secondary | ICD-10-CM | POA: Insufficient documentation

## 2015-02-14 DIAGNOSIS — Z880 Allergy status to penicillin: Secondary | ICD-10-CM | POA: Insufficient documentation

## 2015-02-14 DIAGNOSIS — Z3491 Encounter for supervision of normal pregnancy, unspecified, first trimester: Secondary | ICD-10-CM

## 2015-02-14 DIAGNOSIS — J45909 Unspecified asthma, uncomplicated: Secondary | ICD-10-CM | POA: Diagnosis not present

## 2015-02-14 DIAGNOSIS — O09511 Supervision of elderly primigravida, first trimester: Secondary | ICD-10-CM | POA: Insufficient documentation

## 2015-02-14 DIAGNOSIS — K5900 Constipation, unspecified: Secondary | ICD-10-CM | POA: Diagnosis not present

## 2015-02-14 DIAGNOSIS — O9989 Other specified diseases and conditions complicating pregnancy, childbirth and the puerperium: Secondary | ICD-10-CM | POA: Insufficient documentation

## 2015-02-14 DIAGNOSIS — O10911 Unspecified pre-existing hypertension complicating pregnancy, first trimester: Secondary | ICD-10-CM | POA: Diagnosis not present

## 2015-02-14 DIAGNOSIS — R109 Unspecified abdominal pain: Secondary | ICD-10-CM | POA: Insufficient documentation

## 2015-02-14 DIAGNOSIS — O26899 Other specified pregnancy related conditions, unspecified trimester: Secondary | ICD-10-CM

## 2015-02-14 DIAGNOSIS — J449 Chronic obstructive pulmonary disease, unspecified: Secondary | ICD-10-CM | POA: Diagnosis not present

## 2015-02-14 DIAGNOSIS — O99331 Smoking (tobacco) complicating pregnancy, first trimester: Secondary | ICD-10-CM | POA: Diagnosis not present

## 2015-02-14 DIAGNOSIS — F17210 Nicotine dependence, cigarettes, uncomplicated: Secondary | ICD-10-CM | POA: Insufficient documentation

## 2015-02-14 DIAGNOSIS — O99511 Diseases of the respiratory system complicating pregnancy, first trimester: Secondary | ICD-10-CM | POA: Diagnosis not present

## 2015-02-14 DIAGNOSIS — Z3A10 10 weeks gestation of pregnancy: Secondary | ICD-10-CM | POA: Insufficient documentation

## 2015-02-14 DIAGNOSIS — O10919 Unspecified pre-existing hypertension complicating pregnancy, unspecified trimester: Secondary | ICD-10-CM | POA: Diagnosis present

## 2015-02-14 LAB — URINALYSIS, ROUTINE W REFLEX MICROSCOPIC
BILIRUBIN URINE: NEGATIVE
Glucose, UA: NEGATIVE mg/dL
Hgb urine dipstick: NEGATIVE
KETONES UR: NEGATIVE mg/dL
Leukocytes, UA: NEGATIVE
Nitrite: NEGATIVE
Protein, ur: NEGATIVE mg/dL
Specific Gravity, Urine: 1.025 (ref 1.005–1.030)
pH: 6 (ref 5.0–8.0)

## 2015-02-14 NOTE — MAU Note (Addendum)
Pain in mid abdomen since earlier today. Denies bleeding. Some white vag d/c

## 2015-02-15 DIAGNOSIS — O99611 Diseases of the digestive system complicating pregnancy, first trimester: Secondary | ICD-10-CM

## 2015-02-15 DIAGNOSIS — K5900 Constipation, unspecified: Secondary | ICD-10-CM

## 2015-02-15 LAB — CBC
HEMATOCRIT: 34.4 % — AB (ref 36.0–46.0)
HEMOGLOBIN: 11.6 g/dL — AB (ref 12.0–15.0)
MCH: 30.9 pg (ref 26.0–34.0)
MCHC: 33.7 g/dL (ref 30.0–36.0)
MCV: 91.5 fL (ref 78.0–100.0)
PLATELETS: 286 10*3/uL (ref 150–400)
RBC: 3.76 MIL/uL — AB (ref 3.87–5.11)
RDW: 12.5 % (ref 11.5–15.5)
WBC: 11.6 10*3/uL — AB (ref 4.0–10.5)

## 2015-02-15 MED ORDER — POLYETHYLENE GLYCOL 3350 17 G PO PACK
17.0000 g | PACK | Freq: Once | ORAL | Status: DC
Start: 2015-02-15 — End: 2015-02-15
  Filled 2015-02-15: qty 1

## 2015-02-15 MED ORDER — DOCUSATE SODIUM 100 MG PO CAPS: 100.0000 mg | ORAL_CAPSULE | Freq: Two times a day (BID) | ORAL | Status: DC | PRN

## 2015-02-15 MED ORDER — POLYETHYLENE GLYCOL 3350 17 G PO PACK: 17.0000 g | PACK | Freq: Once | ORAL | Status: DC

## 2015-02-15 MED ORDER — DOCUSATE SODIUM 100 MG PO CAPS
100.0000 mg | ORAL_CAPSULE | Freq: Once | ORAL | Status: DC
  Filled 2015-02-15: qty 1

## 2015-02-15 MED ORDER — POLYETHYLENE GLYCOL 3350 17 GM/SCOOP PO POWD: 17.0000 g | Freq: Every day | ORAL | Status: DC

## 2015-02-15 MED ORDER — DOCUSATE SODIUM 100 MG PO CAPS: 100.0000 mg | ORAL_CAPSULE | Freq: Two times a day (BID) | ORAL | Status: DC

## 2015-02-15 NOTE — MAU Provider Note (Signed)
Chief Complaint: Abdominal Pain   First Provider Initiated Contact with Patient 02/15/15 0018      SUBJECTIVE HPI: Christine Mueller is a 39 y.o. G1P0 at 5665w4d by LMP who presents to maternity admissions reporting abdominal pain.  She describes the pain as constant pain along both sides of her midabdomen.  The pain is similar to what she had on her visit 01/30/15 to MAU. She has not tried any medications for pain. She is drinking lots of water, but reports the pain is not improved. She was treated for n/v on 12/8 but reports this has resolved.  She does report recent constipation with bowel movement today that was hard and uncomfortable.  She was taking Colace but has not taken it in a few days. IUP was confirmed at visit 12/8 and she has New OB appt on 03/02/14 in [facility].  She is taking Labetalol 100 mg BID as prescribed. She denies vaginal bleeding, vaginal itching/burning, urinary symptoms, h/a, dizziness, n/v, or fever/chills.     HPI  Past Medical History  Diagnosis Date  . Hypertension   . COPD (chronic obstructive pulmonary disease) (HCC)   . Asthma    Past Surgical History  Procedure Laterality Date  . No past surgeries     Social History   Social History  . Marital Status: Single    Spouse Name: N/A  . Number of Children: N/A  . Years of Education: N/A   Occupational History  . Not on file.   Social History Main Topics  . Smoking status: Current Every Day Smoker -- 1.00 packs/day for 15 years    Types: Cigarettes  . Smokeless tobacco: Never Used  . Alcohol Use: No  . Drug Use: No  . Sexual Activity: Yes   Other Topics Concern  . Not on file   Social History Narrative   No current facility-administered medications on file prior to encounter.   Current Outpatient Prescriptions on File Prior to Encounter  Medication Sig Dispense Refill  . labetalol (NORMODYNE) 100 MG tablet Take 1 tablet (100 mg total) by mouth 2 (two) times daily. 60 tablet 1  . Prenatal Vit-Fe  Fumarate-FA (PRENATAL MULTIVITAMIN) TABS tablet Take 1 tablet by mouth daily at 12 noon.    Marland Kitchen. albuterol (PROVENTIL HFA;VENTOLIN HFA) 108 (90 BASE) MCG/ACT inhaler Inhale 2 puffs into the lungs every 6 (six) hours as needed for wheezing.     . promethazine (PHENERGAN) 25 MG tablet Take 1 tablet (25 mg total) by mouth every 6 (six) hours as needed. 30 tablet 1   Allergies  Allergen Reactions  . Penicillins Swelling    Has patient had a PCN reaction causing immediate rash, facial/tongue/throat swelling, SOB or lightheadedness with hypotension: Yes Has patient had a PCN reaction causing severe rash involving mucus membranes or skin necrosis: No Has patient had a PCN reaction that required hospitalization Yes Has patient had a PCN reaction occurring within the last 10 years: No If all of the above answers are "NO", then may proceed with Cephalosporin use.     ROS:  Review of Systems  Constitutional: Negative for fever, chills and fatigue.  Respiratory: Negative for shortness of breath.   Cardiovascular: Negative for chest pain.  Gastrointestinal: Positive for abdominal pain. Negative for nausea, vomiting and blood in stool.  Genitourinary: Negative for dysuria, flank pain, vaginal bleeding, vaginal discharge, difficulty urinating, vaginal pain and pelvic pain.  Neurological: Negative for dizziness and headaches.  Psychiatric/Behavioral: Negative.      I  have reviewed patient's Past Medical Hx, Surgical Hx, Family Hx, Social Hx, medications and allergies.   Physical Exam   Patient Vitals for the past 24 hrs:  BP Temp Pulse Resp Height Weight  02/15/15 0030 125/65 mmHg 97.8 F (36.6 C) 90 18 - -  02/14/15 2203 131/77 mmHg 98.6 F (37 C) 91 20  (1.6 m) 162 lb 9.6 oz (73.755 kg)   Constitutional: Well-developed, well-nourished female in no acute distress.  Cardiovascular: normal rate Respiratory: normal effort GI: Abd soft, non-tender. Pos BS x 4, no rebound tenderness or  guarding MS: Extremities nontender, no edema, normal ROM Neurologic: Alert and oriented x 4.  GU: Neg CVAT.  PELVIC EXAM: Deferred  Bedside US by Wynelle Bourgeois, CNM, reveals IUP with gestation consistent dates.  Pt reassured by ultrasound.  LAB RESULTS Results for orders placed or performed during the hospital encounter of 02/14/15 (from the past 24 hour(s))  Urinalysis, Routine w reflex microscopic (not at [facility])     Status: None   Collection Time: 02/14/15 10:10 PM  Result Value Ref Range   Color, Urine YELLOW YELLOW   APPearance CLEAR CLEAR   Specific Gravity, Urine 1.025 1.005 - 1.030   pH 6.0 5.0 - 8.0   Glucose, UA NEGATIVE NEGATIVE mg/dL   Hgb urine dipstick NEGATIVE NEGATIVE   Bilirubin Urine NEGATIVE NEGATIVE   Ketones, ur NEGATIVE NEGATIVE mg/dL   Protein, ur NEGATIVE NEGATIVE mg/dL   Nitrite NEGATIVE NEGATIVE   Leukocytes, UA NEGATIVE NEGATIVE  CBC     Status: Abnormal   Collection Time: 02/14/15 11:50 PM  Result Value Ref Range   WBC 11.6 (H) 4.0 - 10.5 K/uL   RBC 3.76 (L) 3.87 - 5.11 MIL/uL   Hemoglobin 11.6 (L) 12.0 - 15.0 g/dL   HCT 16.1 (L) 09.6 - 04.5 %   MCV 91.5 78.0 - 100.0 fL   MCH 30.9 26.0 - 34.0 pg   MCHC 33.7 30.0 - 36.0 g/dL   RDW 40.9 81.1 - 91.4 %   Platelets 286 150 - 400 K/uL    B/NEG/-- (11/30 1611)  IMAGING No results found.  MAU Management/MDM: Ordered labs and reviewed results.  IUP on ultrasound, no evidence of infection, or acute abdomen. Likely source of pain is constipation/GI.  Recommend Colace BID, Miralax daily PRN, and high fiber diet.  Treatments in MAU included Colace 100 mg and Miralax x 1 dose.  Pt stable at time of discharge.  ASSESSMENT 1. Constipation during pregnancy in first trimester   2. Abdominal pain affecting pregnancy   3. Normal IUP (intrauterine pregnancy) on prenatal ultrasound, first trimester   4. Hypertension in pregnancy, pre-existing, antepartum, first trimester     PLAN Discharge home Colace 100  mg BID Miralax 17g dose daily PRN Increase PO fluids High Fiber diet--teaching done  Follow-up Information    Follow up with [facility]   Specialty:  Obstetrics and Gynecology   Why:  As scheduled on 03/02/14, Return to MAU as needed for emergencies   Contact information:   [address] [place] [address] [phone]      Sharen Counter Certified Nurse-Midwife 02/15/2015  1:10 AM

## 2015-02-15 NOTE — Progress Notes (Signed)
Lisa Leftwich-Kirby CNM in earlier to discuss test results and d/c plan. Written and verbal d/c instructions given and understanding voiced. 

## 2015-02-15 NOTE — Discharge Instructions (Signed)
Constipation, Adult Constipation is when a person has fewer than three bowel movements a week, has difficulty having a bowel movement, or has stools that are dry, hard, or larger than normal. As people grow older, constipation is more common. A low-fiber diet, not taking in enough fluids, and taking certain medicines may make constipation worse.  CAUSES   Certain medicines, such as antidepressants, pain medicine, iron supplements, antacids, and water pills.   Certain diseases, such as diabetes, irritable bowel syndrome (IBS), thyroid disease, or depression.   Not drinking enough water.   Not eating enough fiber-rich foods.   Stress or travel.   Lack of physical activity or exercise.   Ignoring the urge to have a bowel movement.   Using laxatives too much.  SIGNS AND SYMPTOMS   Having fewer than three bowel movements a week.   Straining to have a bowel movement.   Having stools that are hard, dry, or larger than normal.   Feeling full or bloated.   Pain in the lower abdomen.   Not feeling relief after having a bowel movement.  DIAGNOSIS  Your health care provider will take a medical history and perform a physical exam. Further testing may be done for severe constipation. Some tests may include:  A barium enema X-ray to examine your rectum, colon, and, sometimes, your small intestine.   A sigmoidoscopy to examine your lower colon.   A colonoscopy to examine your entire colon. TREATMENT  Treatment will depend on the severity of your constipation and what is causing it. Some dietary treatments include drinking more fluids and eating more fiber-rich foods. Lifestyle treatments may include regular exercise. If these diet and lifestyle recommendations do not help, your health care provider may recommend taking over-the-counter laxative medicines to help you have bowel movements. Prescription medicines may be prescribed if over-the-counter medicines do not work.    HOME CARE INSTRUCTIONS   Eat foods that have a lot of fiber, such as fruits, vegetables, whole grains, and beans.  Limit foods high in fat and processed sugars, such as french fries, hamburgers, cookies, candies, and soda.   A fiber supplement may be added to your diet if you cannot get enough fiber from foods.   Drink enough fluids to keep your urine clear or pale yellow.   Exercise regularly or as directed by your health care provider.   Go to the restroom when you have the urge to go. Do not hold it.   Only take over-the-counter or prescription medicines as directed by your health care provider. Do not take other medicines for constipation without talking to your health care provider first.  [place] IF:   You have bright red blood in your stool.   Your constipation lasts for more than 4 days or gets worse.   You have abdominal or rectal pain.   You have thin, pencil-like stools.   You have unexplained weight loss. MAKE SURE YOU:   Understand these instructions.  Will watch your condition.  Will get help right away if you are not doing well or get worse.   This information is not intended to replace advice given to you by your health care provider. Make sure you discuss any questions you have with your health care provider.   Document Released: 11/07/2003 Document Revised: 03/01/2014 Document Reviewed: 11/20/2012 Elsevier Interactive Patient Education 2016 Elsevier Inc.  High-Fiber Diet Fiber, also called dietary fiber, is a type of carbohydrate found in fruits, vegetables, whole grains, and  beans. A high-fiber diet can have many health benefits. Your health care provider may recommend a high-fiber diet to help: °· Prevent constipation. Fiber can make your bowel movements more regular. °· Lower your cholesterol. °· Relieve hemorrhoids, uncomplicated diverticulosis, or irritable bowel syndrome. °· Prevent overeating as part of a weight-loss  plan. °· Prevent heart disease, type 2 diabetes, and certain cancers. °WHAT IS MY PLAN? °The recommended daily intake of fiber includes: °· 38 grams for men under age 50. °· 30 grams for men over age 50. °· 25 grams for women under age 50. °· 21 grams for women over age 50. °You can get the recommended daily intake of dietary fiber by eating a variety of fruits, vegetables, grains, and beans. Your health care provider may also recommend a fiber supplement if it is not possible to get enough fiber through your diet. °WHAT DO I NEED TO KNOW ABOUT A HIGH-FIBER DIET? °· Fiber supplements have not been widely studied for their effectiveness, so it is better to get fiber through food sources. °· Always check the fiber content on the nutrition facts label of any prepackaged food. Look for foods that contain at least 5 grams of fiber per serving. °· Ask your dietitian if you have questions about specific foods that are related to your condition, especially if those foods are not listed in the following section. °· Increase your daily fiber consumption gradually. Increasing your intake of dietary fiber too quickly may cause bloating, cramping, or gas. °· Drink plenty of water. Water helps you to digest fiber. °WHAT FOODS CAN I EAT? °Grains °Whole-grain breads. Multigrain cereal. Oats and oatmeal. Brown rice. Barley. Bulgur wheat. Millet. Bran muffins. Popcorn. Rye wafer crackers. °Vegetables °Sweet potatoes. Spinach. Kale. Artichokes. Cabbage. Broccoli. Green peas. Carrots. Squash. °Fruits °Berries. Pears. Apples. Oranges. Avocados. Prunes and raisins. Dried figs. °Meats and Other Protein Sources °Navy, kidney, pinto, and soy beans. Split peas. Lentils. Nuts and seeds. °Dairy °Fiber-fortified yogurt. °Beverages °Fiber-fortified soy milk. Fiber-fortified orange juice. °Other °Fiber bars. °The items listed above may not be a complete list of recommended foods or beverages. Contact your dietitian for more options. °WHAT FOODS  ARE NOT RECOMMENDED? °Grains °White bread. Pasta made with refined flour. White rice. °Vegetables °Fried potatoes. Canned vegetables. Well-cooked vegetables.  °Fruits °Fruit juice. Cooked, strained fruit. °Meats and Other Protein Sources °Fatty cuts of meat. Fried poultry or fried fish. °Dairy °Milk. Yogurt. Cream cheese. Sour cream. °Beverages °Soft drinks. °Other °Cakes and pastries. Butter and oils. °The items listed above may not be a complete list of foods and beverages to avoid. Contact your dietitian for more information. °WHAT ARE SOME TIPS FOR INCLUDING HIGH-FIBER FOODS IN MY DIET? °· Eat a wide variety of high-fiber foods. °· Make sure that half of all grains consumed each day are whole grains. °· Replace breads and cereals made from refined flour or white flour with whole-grain breads and cereals. °· Replace white rice with brown rice, bulgur wheat, or millet. °· Start the day with a breakfast that is high in fiber, such as a cereal that contains at least 5 grams of fiber per serving. °· Use beans in place of meat in soups, salads, or pasta. °· Eat high-fiber snacks, such as berries, raw vegetables, nuts, or popcorn. °  °This information is not intended to replace advice given to you by your health care provider. Make sure you discuss any questions you have with your health care provider. °  °Document Released: 02/08/2005 Document Revised: 03/01/2014 Document   Reviewed: 07/24/2013 Elsevier Interactive Patient Education 2016 Elsevier Inc.  Abdominal Pain During Pregnancy Abdominal pain is common in pregnancy. Most of the time, it does not cause harm. There are many causes of abdominal pain. Some causes are more serious than others. Some of the causes of abdominal pain in pregnancy are easily diagnosed. Occasionally, the diagnosis takes time to understand. Other times, the cause is not determined. Abdominal pain can be a sign that something is very wrong with the pregnancy, or the pain may have nothing  to do with the pregnancy at all. For this reason, always tell your health care provider if you have any abdominal discomfort. HOME CARE INSTRUCTIONS  Monitor your abdominal pain for any changes. The following actions may help to alleviate any discomfort you are experiencing:  Do not have sexual intercourse or put anything in your vagina until your symptoms go away completely.  Get plenty of rest until your pain improves.  Drink clear fluids if you feel nauseous. Avoid solid food as long as you are uncomfortable or nauseous.  Only take over-the-counter or prescription medicine as directed by your health care provider.  Keep all follow-up appointments with your health care provider. SEEK IMMEDIATE MEDICAL CARE IF:  You are bleeding, leaking fluid, or passing tissue from the vagina.  You have increasing pain or cramping.  You have persistent vomiting.  You have painful or bloody urination.  You have a fever.  You notice a decrease in your baby's movements.  You have extreme weakness or feel faint.  You have shortness of breath, with or without abdominal pain.  You develop a severe headache with abdominal pain.  You have abnormal vaginal discharge with abdominal pain.  You have persistent diarrhea.  You have abdominal pain that continues even after rest, or gets worse. MAKE SURE YOU:   Understand these instructions.  Will watch your condition.  Will get help right away if you are not doing well or get worse.   This information is not intended to replace advice given to you by your health care provider. Make sure you discuss any questions you have with your health care provider.   Document Released: 02/08/2005 Document Revised: 11/29/2012 Document Reviewed: 09/07/2012 Elsevier Interactive Patient Education [employer] Elsevier Inc.

## 2015-02-15 NOTE — MAU Note (Signed)
Meds not sent from pharmacy yet and pt states she is tired and ready to go home. Left without taking ordered meds but will pick up tomorrow from her pharmacy and start then

## 2015-03-03 ENCOUNTER — Ambulatory Visit (HOSPITAL_COMMUNITY)
Admission: RE | Admit: 2015-03-03 | Discharge: 2015-03-03 | Disposition: A | Discharge status: Home / Self Care | Payer: Medicaid Other | Source: Ambulatory Visit | Attending: Obstetrics & Gynecology | Admitting: Obstetrics & Gynecology

## 2015-03-03 ENCOUNTER — Ambulatory Visit (HOSPITAL_COMMUNITY)
Admission: RE | Admit: 2015-03-03 | Discharge: 2015-03-03 | Disposition: A | Discharge status: Home / Self Care | Payer: Medicaid Other | Source: Ambulatory Visit

## 2015-03-03 ENCOUNTER — Encounter (HOSPITAL_COMMUNITY): Payer: Self-pay

## 2015-03-03 ENCOUNTER — Other Ambulatory Visit: Payer: Self-pay | Admitting: Obstetrics & Gynecology

## 2015-03-03 DIAGNOSIS — O10911 Unspecified pre-existing hypertension complicating pregnancy, first trimester: Secondary | ICD-10-CM

## 2015-03-03 DIAGNOSIS — O09521 Supervision of elderly multigravida, first trimester: Secondary | ICD-10-CM

## 2015-03-03 DIAGNOSIS — O99331 Smoking (tobacco) complicating pregnancy, first trimester: Secondary | ICD-10-CM | POA: Insufficient documentation

## 2015-03-03 DIAGNOSIS — O10011 Pre-existing essential hypertension complicating pregnancy, first trimester: Secondary | ICD-10-CM | POA: Insufficient documentation

## 2015-03-03 DIAGNOSIS — Z3201 Encounter for pregnancy test, result positive: Secondary | ICD-10-CM

## 2015-03-03 DIAGNOSIS — Z3A12 12 weeks gestation of pregnancy: Secondary | ICD-10-CM

## 2015-03-03 DIAGNOSIS — Z315 Encounter for genetic counseling: Secondary | ICD-10-CM | POA: Insufficient documentation

## 2015-03-03 DIAGNOSIS — Z369 Encounter for antenatal screening, unspecified: Secondary | ICD-10-CM

## 2015-03-04 ENCOUNTER — Other Ambulatory Visit (HOSPITAL_COMMUNITY): Payer: Self-pay | Admitting: *Deleted

## 2015-03-04 DIAGNOSIS — O09529 Supervision of elderly multigravida, unspecified trimester: Secondary | ICD-10-CM | POA: Insufficient documentation

## 2015-03-04 DIAGNOSIS — O169 Unspecified maternal hypertension, unspecified trimester: Secondary | ICD-10-CM

## 2015-03-04 NOTE — Progress Notes (Signed)
Genetic Counseling  High-Risk Gestation Note  Appointment Date:  03/04/2014 Referred By: Scheryl DarterArnold, James, MD Date of Birth:  31-May-1975 Partner:  Christine Becketamien   Pregnancy History: G2P0010 Estimated Date of Delivery: 09/09/15 Estimated Gestational Age: 3525w6d Attending: Particia NearingMartha Decker, MD   [name] were seen for genetic counseling because of a maternal age of 40 y.o.Marland Kitchen.   '  In Summary:  Approximate 1 in 4938 risk for fetal aneuploidy related to maternal age of 40 years old  Nuchal translucency ultrasound performed today, within normal limits  Christine Mueller elected to pursue NIPS (Panorama) today; declined amniocentesis  Detailed ultrasound scheduled for 04/16/15  They were counseled regarding maternal age and the association with risk for chromosome conditions due to nondisjunction with aging of the ova.   We reviewed chromosomes, nondisjunction, and the associated 1 in 4238 risk for fetal aneuploidy related to a maternal age of 40 y.o. at 2625w6d gestation. They were counseled that the risk for aneuploidy decreases as gestational age increases, accounting for those pregnancies which spontaneously abort.  We specifically discussed Down syndrome (trisomy 1721), trisomies 5313 and 2618, and sex chromosome aneuploidies (47,XXX and 47,XXY) including the common features and prognoses of each.   We reviewed available screening options including First Screen, Quad screen, noninvasive prenatal screening (NIPS)/cell free DNA (cfDNA) testing, and detailed ultrasound.  They were counseled that screening tests are used to modify a patient's a priori risk for aneuploidy, typically based on age. This estimate provides a pregnancy specific risk assessment. We reviewed the benefits and limitations of each option. Specifically, we discussed the conditions for which each test screens, the detection rates, and false positive rates of each. They were also counseled regarding diagnostic testing via [facility] and  amniocentesis. We reviewed the approximate 1 in 300-500 risk for complications for amniocentesis, including spontaneous pregnancy loss. After consideration of all the options, she elected to proceed with NIPS [facility]).  Those results will be available in 8-10 days.    They also expressed interest in pursuing a nuchal translucency ultrasound, which was performed today and was within normal limits.  The report will be documented separately.  The patient would like to return for a detailed ultrasound at ~18+ weeks gestation.  This appointment was scheduled for 04/16/15.  They understand that screening tests cannot rule out all birth defects or genetic syndromes. The patient was advised of this limitation and states she still does not want additional testing at this time.   Given the couple's reported ethnicity, routine screening for sickle cell trait and other hemoglobin variants is available to Christine Mueller, if desired, via hemoglobin electrophoresis. Given the reported family history, the couple would have the general population chance to be carriers for these conditions. In addition, hemoglobinopathies are routinely screened for as part of the [place] newborn screening panel.   Both family histories were reviewed and found to be contributory for a congenital hole in the heart for the father of the pregnancy's paternal first cousin. He reported died at age 40 years old due to complications from his heart disease and playing basketball. He was otherwise healthy. Congenital heart defects occur in approximately 1% of pregnancies.  Congenital heart defects may occur due to multifactorial influences, chromosomal abnormalities, genetic syndromes or environmental exposures.  Isolated heart defects are generally multifactorial.  Given the reported family history and assuming multifactorial inheritance, the risk for a congenital heart defect in the current pregnancy does not appear to be increased  above the general population risk. Without further information regarding the provided family history, an accurate genetic risk cannot be calculated. Further genetic counseling is warranted if more information is obtained.  Ms. Reh denied exposure to environmental toxins or chemical agents. She denied the use of alcohol or street drugs. She reported smoking approximately a half pack of cigarettes per day and continues to work on cutting back. The associations of smoking in pregnancy were reviewed and cessation encouraged. She denied significant viral illnesses during the course of her pregnancy. Her medical and surgical histories were contributory for hypertension, for which she is treated with labetalol and for asthma, for which she uses an albuterol inhaler as needed.   I counseled this couple regarding the above risks and available options.  The approximate face-to-face time with the genetic counselor was 40 minutes.  Quinn Plowman, MS,  Certified Genetic Counselor 03/04/2015

## 2015-03-11 ENCOUNTER — Telehealth (HOSPITAL_COMMUNITY): Payer: Self-pay | Admitting: MS"

## 2015-03-11 NOTE — Telephone Encounter (Signed)
Called Christine Mueller to discuss her prenatal cell free DNA test results.  [name] had Panorama testing through [facility].  Testing was offered because of maternal age.   The patient was identified by name and DOB.  We reviewed that these are within normal limits, showing a less than 1 in 10,000 risk for trisomies 21, 18 and 13, and monosomy X (Turner syndrome).  In addition, the risk for triploidy/vanishing twin and sex chromosome trisomies (47,XXX and 47,XXY) was also low risk.  We reviewed that this testing identifies > 99% of pregnancies with trisomy 51, trisomy 73, sex chromosome trisomies (47,XXX and 47,XXY), and triploidy. The detection rate for trisomy 18 is 96%.  The detection rate for monosomy X is ~92%.  The false positive rate is <0.1% for all conditions. Testing was also consistent with female fetal sex.  The patient did not wish to know fetal sex. She requested to pick up the results from our office in an envelope so that she could learn the fetal sex later. She understands that this testing does not identify all genetic conditions.  All questions were answered to her satisfaction, she was encouraged to call with additional questions or concerns.  Quinn Plowman, MS Certified Genetic Counselor 03/11/2015 4:40 PM

## 2015-03-12 ENCOUNTER — Other Ambulatory Visit: Payer: Self-pay | Admitting: Advanced Practice Midwife

## 2015-03-12 ENCOUNTER — Encounter: Payer: Self-pay | Admitting: Advanced Practice Midwife

## 2015-03-12 ENCOUNTER — Ambulatory Visit (INDEPENDENT_AMBULATORY_CARE_PROVIDER_SITE_OTHER): Payer: Medicaid Other | Admitting: Advanced Practice Midwife

## 2015-03-12 ENCOUNTER — Other Ambulatory Visit (HOSPITAL_COMMUNITY): Payer: Self-pay

## 2015-03-12 ENCOUNTER — Other Ambulatory Visit (HOSPITAL_COMMUNITY)
Admission: RE | Admit: 2015-03-12 | Discharge: 2015-03-12 | Disposition: A | Discharge status: Home / Self Care | Payer: Medicaid Other | Source: Ambulatory Visit | Attending: Advanced Practice Midwife | Admitting: Advanced Practice Midwife

## 2015-03-12 VITALS — BP 120/73 | HR 86 | Temp 98.4°F | Wt 164.7 lb

## 2015-03-12 DIAGNOSIS — Z124 Encounter for screening for malignant neoplasm of cervix: Secondary | ICD-10-CM | POA: Diagnosis not present

## 2015-03-12 DIAGNOSIS — Z113 Encounter for screening for infections with a predominantly sexual mode of transmission: Secondary | ICD-10-CM

## 2015-03-12 DIAGNOSIS — O9989 Other specified diseases and conditions complicating pregnancy, childbirth and the puerperium: Secondary | ICD-10-CM

## 2015-03-12 DIAGNOSIS — J45909 Unspecified asthma, uncomplicated: Secondary | ICD-10-CM | POA: Insufficient documentation

## 2015-03-12 DIAGNOSIS — Z1151 Encounter for screening for human papillomavirus (HPV): Secondary | ICD-10-CM

## 2015-03-12 DIAGNOSIS — J449 Chronic obstructive pulmonary disease, unspecified: Secondary | ICD-10-CM | POA: Insufficient documentation

## 2015-03-12 DIAGNOSIS — R109 Unspecified abdominal pain: Secondary | ICD-10-CM | POA: Diagnosis not present

## 2015-03-12 DIAGNOSIS — I10 Essential (primary) hypertension: Secondary | ICD-10-CM

## 2015-03-12 DIAGNOSIS — Z789 Other specified health status: Secondary | ICD-10-CM | POA: Insufficient documentation

## 2015-03-12 DIAGNOSIS — Z01419 Encounter for gynecological examination (general) (routine) without abnormal findings: Secondary | ICD-10-CM | POA: Diagnosis present

## 2015-03-12 DIAGNOSIS — O26891 Other specified pregnancy related conditions, first trimester: Secondary | ICD-10-CM | POA: Diagnosis not present

## 2015-03-12 DIAGNOSIS — O99332 Smoking (tobacco) complicating pregnancy, second trimester: Secondary | ICD-10-CM | POA: Diagnosis not present

## 2015-03-12 DIAGNOSIS — O26899 Other specified pregnancy related conditions, unspecified trimester: Secondary | ICD-10-CM

## 2015-03-12 DIAGNOSIS — O09522 Supervision of elderly multigravida, second trimester: Secondary | ICD-10-CM | POA: Diagnosis not present

## 2015-03-12 DIAGNOSIS — R51 Headache: Secondary | ICD-10-CM

## 2015-03-12 DIAGNOSIS — O10919 Unspecified pre-existing hypertension complicating pregnancy, unspecified trimester: Secondary | ICD-10-CM

## 2015-03-12 DIAGNOSIS — O10912 Unspecified pre-existing hypertension complicating pregnancy, second trimester: Secondary | ICD-10-CM | POA: Diagnosis present

## 2015-03-12 DIAGNOSIS — F17200 Nicotine dependence, unspecified, uncomplicated: Secondary | ICD-10-CM | POA: Insufficient documentation

## 2015-03-12 DIAGNOSIS — R519 Headache, unspecified: Secondary | ICD-10-CM

## 2015-03-12 DIAGNOSIS — O10911 Unspecified pre-existing hypertension complicating pregnancy, first trimester: Secondary | ICD-10-CM

## 2015-03-12 DIAGNOSIS — Z23 Encounter for immunization: Secondary | ICD-10-CM | POA: Insufficient documentation

## 2015-03-12 LAB — POCT URINALYSIS DIP (DEVICE)
Bilirubin Urine: NEGATIVE
Glucose, UA: NEGATIVE mg/dL
HGB URINE DIPSTICK: NEGATIVE
Ketones, ur: NEGATIVE mg/dL
LEUKOCYTES UA: NEGATIVE
Nitrite: NEGATIVE
PH: 6.5 (ref 5.0–8.0)
PROTEIN: NEGATIVE mg/dL
UROBILINOGEN UA: 0.2 mg/dL (ref 0.0–1.0)

## 2015-03-12 MED ORDER — LABETALOL HCL 100 MG PO TABS: 100.0000 mg | ORAL_TABLET | Freq: Two times a day (BID) | ORAL | Status: DC

## 2015-03-12 MED ORDER — ASPIRIN 81 MG PO CHEW: 81.0000 mg | CHEWABLE_TABLET | Freq: Every day | ORAL | Status: DC

## 2015-03-12 MED ORDER — ALBUTEROL SULFATE HFA 108 (90 BASE) MCG/ACT IN AERS: 2.0000 | INHALATION_SPRAY | Freq: Four times a day (QID) | RESPIRATORY_TRACT | Status: DC | PRN

## 2015-03-12 NOTE — Progress Notes (Signed)
Initial prenatal education packet given Breastfeeding tip of the week reviewed Flu vaccine Pap/gc/cl today 1hr gtt today

## 2015-03-12 NOTE — Progress Notes (Signed)
   Subjective:    Christine Mueller is a G2P0010 [redacted]w[redacted]d being seen today for her first obstetrical visit.  Her obstetrical history is significant for advanced maternal age, smoker and Chronic HTN, COPD, Asthma. Patient does intend to breast feed. Pregnancy history fully reviewed.  Patient reports no complaints.  Filed Vitals:   03/12/15 1304  BP: 120/73  Pulse: 86  Temp: 98.4 F (36.9 C)  Weight: 164 lb 11.2 oz (74.707 kg)    HISTORY: OB History  Gravida Para Term Preterm AB SAB TAB Ectopic Multiple Living  0    # Outcome Date GA Lbr Len/2nd Weight Sex Delivery Anes PTL Lv  2 Current           1 SAB 2008 [redacted]w[redacted]d            Past Medical History  Diagnosis Date  . Hypertension   . COPD (chronic obstructive pulmonary disease) (HCC)   . Asthma    Past Surgical History  Procedure Laterality Date  . No past surgeries     Family History  Problem Relation Age of Onset  . Diabetes Mother   . Hypertension Mother      Exam    Uterus:     Pelvic Exam:    Perineum: No Hemorrhoids, Normal Perineum   Vulva: Bartholin's, Urethra, Skene's normal   Vagina:  normal discharge   pH:    Cervix: no bleeding following Pap and nulliparous appearance   Adnexa: normal adnexa and no mass, fullness, tenderness   Bony Pelvis: gynecoid  System: Breast:  normal appearance, no masses or tenderness   Skin: normal coloration and turgor, no rashes    Neurologic: oriented, grossly non-focal   Extremities: normal strength, tone, and muscle mass   HEENT neck supple with midline trachea   Mouth/Teeth mucous membranes moist, pharynx normal without lesions   Neck supple and no masses   Cardiovascular: regular rate and rhythm   Respiratory:  appears well, vitals normal, no respiratory distress, acyanotic, normal RR, ear and throat exam is normal, neck free of mass or lymphadenopathy, chest clear, no wheezing, crepitations, rhonchi, normal symmetric air entry   Abdomen: soft, non-tender;  bowel sounds normal; no masses,  no organomegaly   Urinary: urethral meatus normal   Difficult pap due to discomfort.  Not sure if I got TZ.   Assessment:    Pregnancy: G2P0010 Patient Active Problem List   Diagnosis Date Noted  . COPD (chronic obstructive pulmonary disease) (HCC) 03/12/2015  . Asthma 03/12/2015  . Smoker 03/12/2015  . Needs flu shot 03/12/2015  . Advanced maternal age in multigravida   . Chronic hypertension during pregnancy, antepartum 02/14/2015        Plan:     Initial labs drawn. Prenatal vitamins. Problem list reviewed and updated. Genetic Screening discussed First Screen: NT normal and Panorama normal female.  Ultrasound discussed; fetal survey: ordered.  Follow up in 4 weeks. 50% of 30 min visit spent on counseling and coordination of care.   Referred to [facility] Refilled Rx for Albuterol inhaler and Labetalol Smoking cessation discussed   Information given. Per UptoDate, may use Patch or Wellbutrin if necessary. Discussed she should at least cut down if not stop, due to pregnancy, asthma, and COPD.  Will start Baby ASA daily for HTN and get 24 hr urine baseline labs  [facility] 03/12/2015

## 2015-03-12 NOTE — Patient Instructions (Addendum)
Second Trimester of Pregnancy The second trimester is from week 13 through week 28, months 4 through 6. The second trimester is often a time when you feel your best. Your body has also adjusted to being pregnant, and you begin to feel better physically. Usually, morning sickness has lessened or quit completely, you may have more energy, and you may have an increase in appetite. The second trimester is also a time when the fetus is growing rapidly. At the end of the sixth month, the fetus is about 9 inches long and weighs about 1 pounds. You will likely begin to feel the baby move (quickening) between 18 and 20 weeks of the pregnancy. BODY CHANGES Your body goes through many changes during pregnancy. The changes vary from woman to woman.   Your weight will continue to increase. You will notice your lower abdomen bulging out.  You may begin to get stretch marks on your hips, abdomen, and breasts.  You may develop headaches that can be relieved by medicines approved by your health care provider.  You may urinate more often because the fetus is pressing on your bladder.  You may develop or continue to have heartburn as a result of your pregnancy.  You may develop constipation because certain hormones are causing the muscles that push waste through your intestines to slow down.  You may develop hemorrhoids or swollen, bulging veins (varicose veins).  You may have back pain because of the weight gain and pregnancy hormones relaxing your joints between the bones in your pelvis and as a result of a shift in weight and the muscles that support your balance.  Your breasts will continue to grow and be tender.  Your gums may bleed and may be sensitive to brushing and flossing.  Dark spots or blotches (chloasma, mask of pregnancy) may develop on your face. This will likely fade after the baby is born.  A dark line from your belly button to the pubic area (linea nigra) may appear. This will likely fade  after the baby is born.  You may have changes in your hair. These can include thickening of your hair, rapid growth, and changes in texture. Some women also have hair loss during or after pregnancy, or hair that feels dry or thin. Your hair will most likely return to normal after your baby is born. WHAT TO EXPECT AT YOUR PRENATAL VISITS During a routine prenatal visit:  You will be weighed to make sure you and the fetus are growing normally.  Your blood pressure will be taken.  Your abdomen will be measured to track your baby's growth.  The fetal heartbeat will be listened to.  Any test results from the previous visit will be discussed. Your health care provider may ask you:  How you are feeling.  If you are feeling the baby move.  If you have had any abnormal symptoms, such as leaking fluid, bleeding, severe headaches, or abdominal cramping.  If you are using any tobacco products, including cigarettes, chewing tobacco, and electronic cigarettes.  If you have any questions. Other tests that may be performed during your second trimester include:  Blood tests that check for:  Low iron levels (anemia).  Gestational diabetes (between 24 and 28 weeks).  Rh antibodies.  Urine tests to check for infections, diabetes, or protein in the urine.  An ultrasound to confirm the proper growth and development of the baby.  An amniocentesis to check for possible genetic problems.  Fetal screens for spina bifida   and Down syndrome.  HIV (human immunodeficiency virus) testing. Routine prenatal testing includes screening for HIV, unless you choose not to have this test. HOME CARE INSTRUCTIONS   Avoid all smoking, herbs, alcohol, and unprescribed drugs. These chemicals affect the formation and growth of the baby.  Do not use any tobacco products, including cigarettes, chewing tobacco, and electronic cigarettes. If you need help quitting, ask your health care provider. You may receive  counseling support and other resources to help you quit.  Follow your health care provider's instructions regarding medicine use. There are medicines that are either safe or unsafe to take during pregnancy.  Exercise only as directed by your health care provider. Experiencing uterine cramps is a good sign to stop exercising.  Continue to eat regular, healthy meals.  Wear a good support bra for breast tenderness.  Do not use hot tubs, steam rooms, or saunas.  Wear your seat belt at all times when driving.  Avoid raw meat, uncooked cheese, cat litter boxes, and soil used by cats. These carry germs that can cause birth defects in the baby.  Take your prenatal vitamins.  Take 1500-2000 mg of calcium daily starting at the 20th week of pregnancy until you deliver your baby.  Try taking a stool softener (if your health care provider approves) if you develop constipation. Eat more high-fiber foods, such as fresh vegetables or fruit and whole grains. Drink plenty of fluids to keep your urine clear or pale yellow.  Take warm sitz baths to soothe any pain or discomfort caused by hemorrhoids. Use hemorrhoid cream if your health care provider approves.  If you develop varicose veins, wear support hose. Elevate your feet for 15 minutes, 3-4 times a day. Limit salt in your diet.  Avoid heavy lifting, wear low heel shoes, and practice good posture.  Rest with your legs elevated if you have leg cramps or low back pain.  Visit your dentist if you have not gone yet during your pregnancy. Use a soft toothbrush to brush your teeth and be gentle when you floss.  A sexual relationship may be continued unless your health care provider directs you otherwise.  Continue to go to all your prenatal visits as directed by your health care provider. SEEK MEDICAL CARE IF:   You have dizziness.  You have mild pelvic cramps, pelvic pressure, or nagging pain in the abdominal area.  You have persistent nausea,  vomiting, or diarrhea.  You have a bad smelling vaginal discharge.  You have pain with urination. SEEK IMMEDIATE MEDICAL CARE IF:   You have a fever.  You are leaking fluid from your vagina.  You have spotting or bleeding from your vagina.  You have severe abdominal cramping or pain.  You have rapid weight gain or loss.  You have shortness of breath with chest pain.  You notice sudden or extreme swelling of your face, hands, ankles, feet, or legs.  You have not felt your baby move in over an hour.  You have severe headaches that do not go away with medicine.  You have vision changes.   This information is not intended to replace advice given to you by your health care provider. Make sure you discuss any questions you have with your health care provider.   Document Released: 02/02/2001 Document Revised: 03/01/2014 Document Reviewed: 04/11/2012 Elsevier Interactive Patient Education 2016 [employer]. Smoking Cessation, Tips for Success If you are ready to quit smoking, congratulations! You have chosen to help yourself be healthier. Cigarettes  bring nicotine, tar, carbon monoxide, and other irritants into your body. Your lungs, heart, and blood vessels will be able to work better without these poisons. There are many different ways to quit smoking. Nicotine gum, nicotine patches, a nicotine inhaler, or nicotine nasal spray can help with physical craving. Hypnosis, support groups, and medicines help break the habit of smoking. WHAT THINGS CAN I DO TO MAKE QUITTING EASIER?  Here are some tips to help you quit for good:  Pick a date when you will quit smoking completely. Tell all of your friends and family about your plan to quit on that date.  Do not try to slowly cut down on the number of cigarettes you are smoking. Pick a quit date and quit smoking completely starting on that day.  Throw away all cigarettes.   Clean and remove all ashtrays from your home, work, and  car.  On a card, write down your reasons for quitting. Carry the card with you and read it when you get the urge to smoke.  Cleanse your body of nicotine. Drink enough water and fluids to keep your urine clear or pale yellow. Do this after quitting to flush the nicotine from your body.  Learn to predict your moods. Do not let a bad situation be your excuse to have a cigarette. Some situations in your life might tempt you into wanting a cigarette.  Never have "just one" cigarette. It leads to wanting another and another. Remind yourself of your decision to quit.  Change habits associated with smoking. If you smoked while driving or when feeling stressed, try other activities to replace smoking. Stand up when drinking your coffee. Brush your teeth after eating. Sit in a different chair when you read the paper. Avoid alcohol while trying to quit, and try to drink fewer caffeinated beverages. Alcohol and caffeine may urge you to smoke.  Avoid foods and drinks that can trigger a desire to smoke, such as sugary or spicy foods and alcohol.  Ask people who smoke not to smoke around you.  Have something planned to do right after eating or having a cup of coffee. For example, plan to take a walk or exercise.  Try a relaxation exercise to calm you down and decrease your stress. Remember, you may be tense and nervous for the first 2 weeks after you quit, but this will pass.  Find new activities to keep your hands busy. Play with a pen, coin, or rubber band. Doodle or draw things on paper.  Brush your teeth right after eating. This will help cut down on the craving for the taste of tobacco after meals. You can also try mouthwash.   Use oral substitutes in place of cigarettes. Try using lemon drops, carrots, cinnamon sticks, or chewing gum. Keep them handy so they are available when you have the urge to smoke.  When you have the urge to smoke, try deep breathing.  Designate your home as a nonsmoking  area.  If you are a heavy smoker, ask your health care provider about a prescription for nicotine chewing gum. It can ease your withdrawal from nicotine.  Reward yourself. Set aside the cigarette money you save and buy yourself something nice.  Look for support from others. Join a support group or smoking cessation program. Ask someone at home or at work to help you with your plan to quit smoking.  Always ask yourself, "Do I need this cigarette or is this just a reflex?" Tell yourself, "Today,  I choose not to smoke," or "I do not want to smoke." You are reminding yourself of your decision to quit.  Do not replace cigarette smoking with electronic cigarettes (commonly called e-cigarettes). The safety of e-cigarettes is unknown, and some may contain harmful chemicals.  If you relapse, do not give up! Plan ahead and think about what you will do the next time you get the urge to smoke. HOW WILL I FEEL WHEN I QUIT SMOKING? You may have symptoms of withdrawal because your body is used to nicotine (the addictive substance in cigarettes). You may crave cigarettes, be irritable, feel very hungry, cough often, get headaches, or have difficulty concentrating. The withdrawal symptoms are only temporary. They are strongest when you first quit but will go away within 10-14 days. When withdrawal symptoms occur, stay in control. Think about your reasons for quitting. Remind yourself that these are signs that your body is healing and getting used to being without cigarettes. Remember that withdrawal symptoms are easier to treat than the major diseases that smoking can cause.  Even after the withdrawal is over, expect periodic urges to smoke. However, these cravings are generally short lived and will go away whether you smoke or not. Do not smoke! WHAT RESOURCES ARE AVAILABLE TO HELP ME QUIT SMOKING? Your health care provider can direct you to community resources or hospitals for support, which may include:  Group  support.  Education.  Hypnosis.  Therapy.   This information is not intended to replace advice given to you by your health care provider. Make sure you discuss any questions you have with your health care provider.   Document Released: 11/07/2003 Document Revised: 03/01/2014 Document Reviewed: 07/27/2012 Elsevier Interactive Patient Education [employer] Elsevier Inc.

## 2015-03-13 LAB — GC/CHLAMYDIA PROBE AMP (~~LOC~~) NOT AT ARMC
CHLAMYDIA, DNA PROBE: NEGATIVE
Neisseria Gonorrhea: NEGATIVE

## 2015-03-14 LAB — COMPREHENSIVE METABOLIC PANEL
ALBUMIN: 3.5 g/dL — AB (ref 3.6–5.1)
ALT: 39 U/L — ABNORMAL HIGH (ref 6–29)
AST: 22 U/L (ref 10–30)
Alkaline Phosphatase: 79 U/L (ref 33–115)
BUN: 7 mg/dL (ref 7–25)
CHLORIDE: 106 mmol/L (ref 98–110)
CO2: 19 mmol/L — AB (ref 20–31)
CREATININE: 0.6 mg/dL (ref 0.50–1.10)
Calcium: 9 mg/dL (ref 8.6–10.2)
Glucose, Bld: 145 mg/dL — ABNORMAL HIGH (ref 65–99)
Potassium: 3.9 mmol/L (ref 3.5–5.3)
SODIUM: 135 mmol/L (ref 135–146)
Total Bilirubin: 0.3 mg/dL (ref 0.2–1.2)
Total Protein: 5.6 g/dL — ABNORMAL LOW (ref 6.1–8.1)

## 2015-03-14 LAB — CYTOLOGY - PAP

## 2015-03-17 LAB — CREATININE CLEARANCE, URINE, 24 HOUR
CREATININE, URINE: 125 mg/dL (ref 20–320)
CREATININE: 0.63 mg/dL (ref 0.50–1.10)
Creatinine Clearance: 186 mL/min — ABNORMAL HIGH (ref 75–115)
Creatinine, 24H Ur: 1.69 g/(24.h) (ref 0.63–2.50)

## 2015-03-17 LAB — PROTEIN, URINE, 24 HOUR
PROTEIN, URINE: 8 mg/dL (ref 5–24)
Protein, 24H Urine: 108 mg/24 h (ref <150)

## 2015-03-19 LAB — GLUCOSE TOLERANCE, 1 HOUR (50G) W/O FASTING: GLUCOSE 1 HOUR GTT: 100 mg/dL (ref 70–140)

## 2015-03-25 ENCOUNTER — Inpatient Hospital Stay (HOSPITAL_COMMUNITY)
Admission: AD | Admit: 2015-03-25 | Discharge: 2015-03-25 | Disposition: A | Discharge status: Home / Self Care | Payer: Medicaid Other | Source: Ambulatory Visit | Attending: Obstetrics & Gynecology | Admitting: Obstetrics & Gynecology

## 2015-03-25 ENCOUNTER — Encounter (HOSPITAL_COMMUNITY): Payer: Self-pay | Admitting: *Deleted

## 2015-03-25 DIAGNOSIS — F17210 Nicotine dependence, cigarettes, uncomplicated: Secondary | ICD-10-CM | POA: Diagnosis not present

## 2015-03-25 DIAGNOSIS — L989 Disorder of the skin and subcutaneous tissue, unspecified: Secondary | ICD-10-CM

## 2015-03-25 DIAGNOSIS — Z3A16 16 weeks gestation of pregnancy: Secondary | ICD-10-CM | POA: Insufficient documentation

## 2015-03-25 DIAGNOSIS — O26892 Other specified pregnancy related conditions, second trimester: Secondary | ICD-10-CM | POA: Diagnosis not present

## 2015-03-25 DIAGNOSIS — O10919 Unspecified pre-existing hypertension complicating pregnancy, unspecified trimester: Secondary | ICD-10-CM

## 2015-03-25 DIAGNOSIS — Z880 Allergy status to penicillin: Secondary | ICD-10-CM | POA: Insufficient documentation

## 2015-03-25 DIAGNOSIS — S60463A Insect bite (nonvenomous) of left middle finger, initial encounter: Secondary | ICD-10-CM | POA: Diagnosis present

## 2015-03-25 DIAGNOSIS — Z7982 Long term (current) use of aspirin: Secondary | ICD-10-CM | POA: Insufficient documentation

## 2015-03-25 DIAGNOSIS — Z23 Encounter for immunization: Secondary | ICD-10-CM

## 2015-03-25 NOTE — MAU Note (Signed)
Bite on middle finger, 2nd knuckle left hand.  Was scratching it earlier, now there is a red raised area.  Skin is not broken, no drainage or bleeding.

## 2015-03-25 NOTE — MAU Provider Note (Signed)
History     CSN: [id number]  Arrival date and time: 03/25/15 1356   First Provider Initiated Contact with Patient 03/25/15 1517      Chief Complaint  Patient presents with  . Insect Bite   HPI Pt is 16w pregnant G2P0010 who presents with 2 lesions on her left knuckle of middle finger.  Pt states she thinks she may have gotten an insect bite.  Pt states she woke up with it this morning.  Pt states that it itches- no pain or surrounding swelling.  Pt denies abd pain, spotting or bleeding. Pt gets OB care at [facility] RN note: Bite on middle finger, 2nd knuckle left hand. Was scratching it earlier, now there is a red raised area. Skin is not broken, no drainage or bleeding. Past Medical History  Diagnosis Date  . Hypertension   . COPD (chronic obstructive pulmonary disease) (HCC)   . Asthma     Past Surgical History  Procedure Laterality Date  . No past surgeries      Family History  Problem Relation Age of Onset  . Diabetes Mother   . Hypertension Mother     Social History  Substance Use Topics  . Smoking status: Current Every Day Smoker -- 1.00 packs/day for 15 years    Types: Cigarettes  . Smokeless tobacco: Never Used     Comment: Discussed need to quit for baby, asthma and COPD. Information given  . Alcohol Use: No    Allergies:  Allergies  Allergen Reactions  . Penicillins Swelling    Has patient had a PCN reaction causing immediate rash, facial/tongue/throat swelling, SOB or lightheadedness with hypotension: Yes Has patient had a PCN reaction causing severe rash involving mucus membranes or skin necrosis: No Has patient had a PCN reaction that required hospitalization Yes Has patient had a PCN reaction occurring within the last 10 years: No If all of the above answers are "NO", then may proceed with Cephalosporin use.     Prescriptions prior to admission  Medication Sig Dispense Refill Last Dose  . albuterol (PROVENTIL HFA;VENTOLIN HFA) 108 (90 Base)  MCG/ACT inhaler Inhale 2 puffs into the lungs every 6 (six) hours as needed for wheezing. 1 Inhaler 1 Past Week at Unknown time  . aspirin 81 MG chewable tablet Chew 1 tablet (81 mg total) by mouth daily. 30 tablet 2 03/24/2015 at Unknown time  . labetalol (NORMODYNE) 100 MG tablet Take 1 tablet (100 mg total) by mouth 2 (two) times daily. 60 tablet 1 03/24/2015 at 0900  . nicotine (NICODERM CQ - DOSED IN MG/24 HR) 7 mg/24hr patch Place 7 mg onto the skin daily.   03/25/2015 at Unknown time  . Prenatal Vit-Fe Fumarate-FA (PRENATAL MULTIVITAMIN) TABS tablet Take 1 tablet by mouth daily at 12 noon.   03/24/2015 at Unknown time    Review of Systems  Constitutional: Negative for fever and chills.  Gastrointestinal: Negative for nausea, vomiting, abdominal pain, diarrhea and constipation.  Skin: Positive for itching.  Neurological: Negative for dizziness and headaches.   Physical Exam   Blood pressure 129/63, pulse 87, temperature 98.3 F (36.8 C), temperature source Oral, resp. rate 18, weight 169 lb 6.4 oz (76.839 kg), last menstrual period 12/03/2014.  Physical Exam  Nursing note and vitals reviewed. Constitutional: She is oriented to person, place, and time. She appears well-developed and well-nourished. No distress.  HENT:  Head: Normocephalic.  Eyes: Pupils are equal, round, and reactive to light.  Neck: Normal range of motion. Neck  supple.  Respiratory: Effort normal.  GI: Soft.  FHR 160 bpm with doppler  Musculoskeletal: Normal range of motion.  2 small flesh colored lesions left hand middle finger knuckle.  Nontender; no surrounding edema or reddness. No drainage- does not appear to be an infectious process  Neurological: She is alert and oriented to person, place, and time.  Skin: Skin is warm.  Psychiatric: She has a normal mood and affect.    MAU Course  Procedures   Assessment and Plan  Skin lesion on left hand- recommended OTC cortisone cream and cover with Band  Aid Return for increase in swelling, pain, reddness Viable IUP 16week Follow up with [facility] appointment- sooner if sx  LINEBERRY,SUSAN 03/25/2015, 3:26 PM

## 2015-03-25 NOTE — Progress Notes (Signed)
Susan Lineberry NP in earlier to discuss d/c plan. Written and verbal d/c instructions given and understanding voiced 

## 2015-04-10 ENCOUNTER — Ambulatory Visit (INDEPENDENT_AMBULATORY_CARE_PROVIDER_SITE_OTHER): Payer: Medicaid Other | Admitting: Family

## 2015-04-10 VITALS — BP 125/66 | HR 90 | Temp 98.7°F | Wt 172.6 lb

## 2015-04-10 DIAGNOSIS — Z720 Tobacco use: Secondary | ICD-10-CM | POA: Diagnosis not present

## 2015-04-10 DIAGNOSIS — F17200 Nicotine dependence, unspecified, uncomplicated: Secondary | ICD-10-CM

## 2015-04-10 DIAGNOSIS — O09522 Supervision of elderly multigravida, second trimester: Secondary | ICD-10-CM | POA: Diagnosis not present

## 2015-04-10 DIAGNOSIS — O10919 Unspecified pre-existing hypertension complicating pregnancy, unspecified trimester: Secondary | ICD-10-CM

## 2015-04-10 DIAGNOSIS — O10912 Unspecified pre-existing hypertension complicating pregnancy, second trimester: Secondary | ICD-10-CM | POA: Diagnosis not present

## 2015-04-10 LAB — POCT URINALYSIS DIP (DEVICE)
Bilirubin Urine: NEGATIVE
Glucose, UA: NEGATIVE mg/dL
Hgb urine dipstick: NEGATIVE
Ketones, ur: NEGATIVE mg/dL
Leukocytes, UA: NEGATIVE
Nitrite: NEGATIVE
Protein, ur: NEGATIVE mg/dL
Specific Gravity, Urine: 1.02 (ref 1.005–1.030)
Urobilinogen, UA: 0.2 mg/dL (ref 0.0–1.0)
pH: 7 (ref 5.0–8.0)

## 2015-04-10 NOTE — Progress Notes (Signed)
Subjective:  Christine Mueller is a 40 y.o. G2P0010 at [redacted]w[redacted]d being seen today for ongoing prenatal care.  She is currently monitored for the following issues for this high-risk pregnancy and has Chronic hypertension during pregnancy, antepartum; Advanced maternal age in multigravida; COPD (chronic obstructive pulmonary disease) (HCC); Asthma; Smoker; and Needs flu shot on her problem list.  Patient reports no complaints.  Reports not taking blood pressure medication or using nicotine patch.  Smoking approximately 10 cigarettes per day.   Contractions: Not present. Vag. Bleeding: None.  Movement: Present. Denies leaking of fluid.   The following portions of the patient's history were reviewed and updated as appropriate: allergies, current medications, past family history, past medical history, past social history, past surgical history and problem list. Problem list updated.  Objective:   Filed Vitals:   04/10/15 0823  BP: 125/66  Pulse: 90  Temp: 98.7 F (37.1 C)  Weight: 172 lb 9.6 oz (78.291 kg)    Fetal Status: Fetal Heart Rate (bpm): 164 Fundal Height: 20 cm Movement: Present     General:  Alert, oriented and cooperative. Patient is in no acute distress.  Skin: Skin is warm and dry. No rash noted.   Cardiovascular: Normal heart rate noted  Respiratory: Normal respiratory effort, no problems with respiration noted  Abdomen: Soft, gravid, appropriate for gestational age. Pain/Pressure: Present     Pelvic: Vag. Bleeding: None     Cervical exam deferred        Extremities: Normal range of motion.  Edema: Trace  Mental Status: Normal mood and affect. Normal behavior. Normal judgment and thought content.   Urinalysis: Urine Protein: Negative Urine Glucose: Negative  Assessment and Plan:  Pregnancy: G2P0010 at [redacted]w[redacted]d  1. Chronic hypertension during pregnancy, antepartum - Discussed blood pressure changes in pregnancy  2. Advanced maternal age in multigravida, second trimester -  AFP, Quad Screen  3. Smoker - Discussed strategies for decreasing smoking; advised to cut back on 1-2/day - Reviewed impacts on fetal growth  General obstetric precautions including but not limited to vaginal bleeding and pelvic pain reviewed in detail with the patient. Please refer to After Visit Summary for other counseling recommendations.  Return in about 3 weeks (around 05/01/2015).   Eino Farber Kennith Gain, CNM

## 2015-04-10 NOTE — Progress Notes (Signed)
Breastfeeding tip of the week reviewed. 

## 2015-04-10 NOTE — Patient Instructions (Signed)
AREA PEDIATRIC/FAMILY PRACTICE PHYSICIANS  [facility] [address] [place], [place] [address] Phone - [phone]   Fax - [phone]  [facility] [address] [place], [place]  [address] Phone - [phone]   Fax - [phone]  [facility] [address] [place], [place]  [address] Phone - [phone]   Fax - [phone]  [facility] [address] [place], [place]  [address] Phone - [phone]   Fax - [phone]  [facility] [address] [place], [place]  [address] Phone - [phone]   Fax - [phone]  [facility] [address] [place], [place]  [address] Phone - [phone]   Fax - [phone]  [facility] [address] [place], [place]  [address] Phone - [phone]   Fax - [phone]  [facility] [address] [place], [place]  [address] Phone - [phone]   Fax - [phone]  [facility] [address] [place], [place]  [address] Phone - [phone]   Fax - [phone] [facility] [address] [place], [place]  [address] Phone - [phone]   Fax - [phone]  [facility] [address] [place], [place]  [address] Phone - [phone]   Fax - [phone]  [facility] [address] [place], [place]  [address] Phone - [phone]   Fax - [phone]  [facility] [address] [place], [place]  [address] Phone - [phone]   Fax - [phone]  [facility] [address] [place], [place]  [address] Phone - [phone]  [facility] [address] [place], [place]  [address] Phone - [phone]   Fax - [phone]  [facility] [address] [place], [place]  [address] Phone - [phone]   Fax - [phone]  [facility] [address] [place], [place]  [address] Phone - [phone]   Fax - [phone]  [facility] [address] [place], [place]  [address] Phone - [phone]   Fax - [phone]   [facility] [address] [place], [place]  [address] Phone - [phone]   Fax - [phone]  [facility] [address] [place], [place]  [address] Phone - [phone]   Fax - [phone]  [facility] [address] [place], [place]  [address] Phone - [phone]   Fax - [phone]  [facility] [address] , [place]  [address] Phone - [phone]   Fax - [phone]  [facility] [address] , [place]  [address] Phone - [phone]   Fax - [phone] [facility] [address] [place], [place]  [address] Phone - [phone]   Fax - [phone]  [facility] [address] [place], [place]  [address] Phone - [phone]   Fax - [phone]   [facility] [address] [place],   [address] Phone - [phone]   Fax - [phone]   

## 2015-04-14 LAB — AFP, QUAD SCREEN
AFP: 46.7 ng/mL
CURR GEST AGE: 18.2 wks.days
HCG, Total: 23.22 IU/mL
INH: 587.4 pg/mL
Interpretation-AFP: POSITIVE — AB
MOM FOR AFP: 0.98
MoM for INH: 3.68
MoM for hCG: 0.9
Open Spina bifida: NEGATIVE
TRI 18 SCR RISK EST: NEGATIVE
UE3 MOM: 0.83
UE3 VALUE: 1.1 ng/mL

## 2015-04-16 ENCOUNTER — Encounter (HOSPITAL_COMMUNITY): Payer: Self-pay

## 2015-04-16 ENCOUNTER — Ambulatory Visit (HOSPITAL_COMMUNITY)
Admission: RE | Admit: 2015-04-16 | Discharge: 2015-04-16 | Disposition: A | Discharge status: Home / Self Care | Payer: Medicaid Other | Source: Ambulatory Visit | Attending: Obstetrics & Gynecology | Admitting: Obstetrics & Gynecology

## 2015-04-16 VITALS — BP 123/69 | HR 93 | Wt 172.2 lb

## 2015-04-16 DIAGNOSIS — Z3A19 19 weeks gestation of pregnancy: Secondary | ICD-10-CM | POA: Insufficient documentation

## 2015-04-16 DIAGNOSIS — O10012 Pre-existing essential hypertension complicating pregnancy, second trimester: Secondary | ICD-10-CM | POA: Insufficient documentation

## 2015-04-16 DIAGNOSIS — O10919 Unspecified pre-existing hypertension complicating pregnancy, unspecified trimester: Secondary | ICD-10-CM

## 2015-04-16 DIAGNOSIS — O169 Unspecified maternal hypertension, unspecified trimester: Secondary | ICD-10-CM

## 2015-04-26 ENCOUNTER — Encounter (HOSPITAL_COMMUNITY): Payer: Self-pay | Admitting: *Deleted

## 2015-04-26 ENCOUNTER — Inpatient Hospital Stay (HOSPITAL_COMMUNITY)
Admission: AD | Admit: 2015-04-26 | Discharge: 2015-04-26 | Disposition: A | Discharge status: Home / Self Care | Payer: Medicaid Other | Source: Ambulatory Visit | Attending: Family Medicine | Admitting: Family Medicine

## 2015-04-26 DIAGNOSIS — O9989 Other specified diseases and conditions complicating pregnancy, childbirth and the puerperium: Secondary | ICD-10-CM | POA: Diagnosis not present

## 2015-04-26 DIAGNOSIS — J45909 Unspecified asthma, uncomplicated: Secondary | ICD-10-CM | POA: Insufficient documentation

## 2015-04-26 DIAGNOSIS — Z3A20 20 weeks gestation of pregnancy: Secondary | ICD-10-CM

## 2015-04-26 DIAGNOSIS — F17210 Nicotine dependence, cigarettes, uncomplicated: Secondary | ICD-10-CM | POA: Insufficient documentation

## 2015-04-26 DIAGNOSIS — Z880 Allergy status to penicillin: Secondary | ICD-10-CM | POA: Insufficient documentation

## 2015-04-26 DIAGNOSIS — O09512 Supervision of elderly primigravida, second trimester: Secondary | ICD-10-CM | POA: Diagnosis not present

## 2015-04-26 DIAGNOSIS — B9789 Other viral agents as the cause of diseases classified elsewhere: Secondary | ICD-10-CM

## 2015-04-26 DIAGNOSIS — O99512 Diseases of the respiratory system complicating pregnancy, second trimester: Secondary | ICD-10-CM | POA: Insufficient documentation

## 2015-04-26 DIAGNOSIS — J069 Acute upper respiratory infection, unspecified: Secondary | ICD-10-CM

## 2015-04-26 DIAGNOSIS — Z7982 Long term (current) use of aspirin: Secondary | ICD-10-CM | POA: Insufficient documentation

## 2015-04-26 DIAGNOSIS — O99332 Smoking (tobacco) complicating pregnancy, second trimester: Secondary | ICD-10-CM | POA: Diagnosis not present

## 2015-04-26 DIAGNOSIS — O10012 Pre-existing essential hypertension complicating pregnancy, second trimester: Secondary | ICD-10-CM | POA: Insufficient documentation

## 2015-04-26 DIAGNOSIS — R05 Cough: Secondary | ICD-10-CM | POA: Diagnosis present

## 2015-04-26 LAB — URINALYSIS, ROUTINE W REFLEX MICROSCOPIC
BILIRUBIN URINE: NEGATIVE
GLUCOSE, UA: 100 mg/dL — AB
HGB URINE DIPSTICK: NEGATIVE
KETONES UR: NEGATIVE mg/dL
Leukocytes, UA: NEGATIVE
NITRITE: NEGATIVE
PH: 6 (ref 5.0–8.0)
Protein, ur: NEGATIVE mg/dL
Specific Gravity, Urine: 1.025 (ref 1.005–1.030)

## 2015-04-26 LAB — INFLUENZA PANEL BY PCR (TYPE A & B)
H1N1 flu by pcr: NOT DETECTED
INFLBPCR: NEGATIVE
Influenza A By PCR: NEGATIVE

## 2015-04-26 NOTE — MAU Note (Signed)
Pt complain of a productive cough (yellow) and upper respiratory congestion that started Monday night.  Pt denies fever, chills, or body aches.

## 2015-04-26 NOTE — Discharge Instructions (Signed)
You have a viral infection that will resolve on its own over time.  Symptoms typically last 3-7 days but can stretch out to 2-3 weeks.  Unfortunately, antibiotics are not helpful for viral infections.  Humidifier and saline nasal spray for nasal congestion  Regular robitussin, cough drops for cough  Warm salt water gargles for sore throat  Mucinex with lots of water to help you cough up the mucous in your chest if needed  Drink plenty of fluids and stay hydrated!  Wash your hands frequently.  Call if you are not improving by 7-10 days or if you feel like you are getting worse

## 2015-04-26 NOTE — MAU Provider Note (Signed)
History  Chief Complaint:  Cough and URI  Nicanor BakeDalila L Sharlet SalinaBenjamin is a 40 y.o. G2P0010 female at 1158w4d presenting w/ report of cold w/ cough productive of thick yellow phlegm since Monday night. 'Dry' throat.  Denies fever/chills/bodyaches/headache/earache. No recent sick contacts. Hasn't been taking any otc relief measures. Has been using her inhaler prn. Did get flu shot last month in clinic per her report.  Reports positive fetal movement, contractions: none, vaginal bleeding: none, membranes: intact. Denies uti s/s, abnormal/malodorous vag d/c or vulvovaginal itching/irritation.   [facility].  Next visit 3/9. [facility] Lab[facility] & baby ASA, AMA, COPD, Smoker- 1/2ppd now.   Obstetrical History: OB History    Gravida Para Term Preterm AB TAB SAB Ectopic Multiple Living   2    1  1    0      Past Medical History: Past Medical History  Diagnosis Date  . Hypertension   . COPD (chronic obstructive pulmonary disease) (HCC)   . Asthma     Past Surgical History: Past Surgical History  Procedure Laterality Date  . Wisdom teeth removal      Social History: Social History   Social History  . Marital Status: Single    Spouse Name: N/A  . Number of Children: N/A  . Years of Education: N/A   Social History Main Topics  . Smoking status: Current Every Day Smoker -- 1.00 packs/day for 15 years    Types: Cigarettes  . Smokeless tobacco: Never Used     Comment: Discussed need to quit for baby, asthma and COPD. Information given  . Alcohol Use: No  . Drug Use: No  . Sexual Activity: Yes    Birth Control/ Protection: None   Other Topics Concern  . None   Social History Narrative    Allergies: Allergies  Allergen Reactions  . Penicillins Swelling    Has patient had a PCN reaction causing immediate rash, facial/tongue/throat swelling, SOB or lightheadedness with hypotension: Yes Has patient had a PCN reaction causing severe rash involving mucus  membranes or skin necrosis: No Has patient had a PCN reaction that required hospitalization Yes Has patient had a PCN reaction occurring within the last 10 years: No If all of the above answers are "NO", then may proceed with Cephalosporin use.     Prescriptions prior to admission  Medication Sig Dispense Refill Last Dose  . aspirin 81 MG chewable tablet Chew 1 tablet (81 mg total) by mouth daily. 30 tablet 2 04/26/2015 at Unknown time  . labetalol (NORMODYNE) 100 MG tablet Take 1 tablet (100 mg total) by mouth 2 (two) times daily. 60 tablet 1 04/26/2015 at Unknown time  . Prenatal Vit-Fe Fumarate-FA (PRENATAL MULTIVITAMIN) TABS tablet Take 1 tablet by mouth daily at 12 noon.   04/26/2015 at Unknown time  . albuterol (PROVENTIL HFA;VENTOLIN HFA) 108 (90 Base) MCG/ACT inhaler Inhale 2 puffs into the lungs every 6 (six) hours as needed for wheezing. 1 Inhaler 1 prn    Review of Systems  Pertinent pos/neg as indicated in HPI  Physical Exam  Blood pressure 129/66, pulse 96, temperature 99.3 F (37.4 C), temperature source Oral, resp. rate 18, height 5' 3.5" (1.613 m), weight 78.472 kg (173 lb), last menstrual period 12/03/2014, SpO2 100 %. General appearance: alert, cooperative and no distress, sitting up on side of bed on phone, appears well Throat: w/o erythema or exudate Lungs: clear to auscultation bilaterally, normal effort, no wheezing/rhonchi Heart: regular rate and rhythm  Abdomen: gravid, soft, non-tender  Spec exam: n/a Cultures/Specimens: n/a    Presentation: unsure  Fetal monitoring: FHR: 160 via doppler  MAU Course  Exam Flu swab  Labs:  Results for orders placed or performed during the hospital encounter of 04/26/15 (from the past 24 hour(s))  Urinalysis, Routine w reflex microscopic (not at [facility])     Status: Abnormal   Collection Time: 04/26/15 10:13 AM  Result Value Ref Range   Color, Urine YELLOW YELLOW   APPearance CLEAR CLEAR   Specific Gravity, Urine 1.025 1.005  - 1.030   pH 6.0 5.0 - 8.0   Glucose, UA 100 (A) NEGATIVE mg/dL   Hgb urine dipstick NEGATIVE NEGATIVE   Bilirubin Urine NEGATIVE NEGATIVE   Ketones, ur NEGATIVE NEGATIVE mg/dL   Protein, ur NEGATIVE NEGATIVE mg/dL   Nitrite NEGATIVE NEGATIVE   Leukocytes, UA NEGATIVE NEGATIVE    Imaging:  n/a  Assessment and Plan  A:  [redacted]w[redacted]d SIUP  G2P0010  Viral URI w/ cough  CHTN  Asthma  Smoker   P:  Will get flu swab before she leaves, will call back if +, not inclined to tx today d/t no fever/chills/bodyaches/headaches  Gave printed info on URI relief measures, humidifier, cough drops/syrup, mucinex, etc  Call clinic/return if worsening or just not improving after 7-10d from onset  Stop smoking  Use inhaler prn  Stay well hydrated  Reviewed warning s/s to report, reasons to return  Keep next appt at [facility] on 3/9 as scheduled   Marge Duncans CNM,WHNP-BC 3/4/201711:14 AM

## 2015-05-01 ENCOUNTER — Ambulatory Visit (INDEPENDENT_AMBULATORY_CARE_PROVIDER_SITE_OTHER): Payer: Medicaid Other | Admitting: Certified Nurse Midwife

## 2015-05-01 VITALS — BP 135/76 | HR 86 | Temp 98.4°F | Wt 171.7 lb

## 2015-05-01 DIAGNOSIS — O10919 Unspecified pre-existing hypertension complicating pregnancy, unspecified trimester: Secondary | ICD-10-CM

## 2015-05-01 DIAGNOSIS — O10912 Unspecified pre-existing hypertension complicating pregnancy, second trimester: Secondary | ICD-10-CM

## 2015-05-01 DIAGNOSIS — F17200 Nicotine dependence, unspecified, uncomplicated: Secondary | ICD-10-CM

## 2015-05-01 DIAGNOSIS — O09522 Supervision of elderly multigravida, second trimester: Secondary | ICD-10-CM

## 2015-05-01 LAB — POCT URINALYSIS DIP (DEVICE)
Bilirubin Urine: NEGATIVE
GLUCOSE, UA: NEGATIVE mg/dL
HGB URINE DIPSTICK: NEGATIVE
Ketones, ur: NEGATIVE mg/dL
LEUKOCYTES UA: NEGATIVE
NITRITE: NEGATIVE
Protein, ur: NEGATIVE mg/dL
Specific Gravity, Urine: 1.015 (ref 1.005–1.030)
UROBILINOGEN UA: 0.2 mg/dL (ref 0.0–1.0)
pH: 6 (ref 5.0–8.0)

## 2015-05-01 NOTE — Progress Notes (Signed)
Subjective:  Christine Mueller is a 40 y.o. G2P0010 at 3762w2d being seen today for ongoing prenatal care.  She is currently monitored for the following issues for this high-risk pregnancy and has Chronic hypertension during pregnancy, antepartum; Advanced maternal age in multigravida; COPD (chronic obstructive pulmonary disease) (HCC); Asthma; Smoker; and Needs flu shot on her problem list.  Patient reports no complaints.  Contractions: Not present. Vag. Bleeding: None.  Movement: Present. Denies leaking of fluid.   The following portions of the patient's history were reviewed and updated as appropriate: allergies, current medications, past family history, past medical history, past social history, past surgical history and problem list. Problem list updated.  Objective:   Filed Vitals:   05/01/15 0954  BP: 135/76  Pulse: 86  Temp: 98.4 F (36.9 C)  Weight: 171 lb 11.2 oz (77.883 kg)    Fetal Status: Fetal Heart Rate (bpm): 160   Movement: Present     General:  Alert, oriented and cooperative. Patient is in no acute distress.  Skin: Skin is warm and dry. No rash noted.   Cardiovascular: Normal heart rate noted  Respiratory: Normal respiratory effort, no problems with respiration noted  Abdomen: Soft, gravid, appropriate for gestational age. Pain/Pressure: Absent     Pelvic: Vag. Bleeding: None Vag D/C Character: White   Cervical exam deferred        Extremities: Normal range of motion.  Edema: Trace  Mental Status: Normal mood and affect. Normal behavior. Normal judgment and thought content.   Urinalysis: Urine Protein: Negative Urine Glucose: Negative  Assessment and Plan:  Pregnancy: G2P0010 at 6762w2d  1. Advanced maternal age in multigravida, second trimester   2. Smoker   3. Chronic hypertension during pregnancy, antepartum Labetolol And Baby ASA  Preterm labor symptoms and general obstetric precautions including but not limited to vaginal bleeding, contractions, leaking  of fluid and fetal movement were reviewed in detail with the patient. Please refer to After Visit Summary for other counseling recommendations.  Return in about 2 weeks (around 05/15/2015).   Rhea PinkLori A Jakiah Goree, CNM

## 2015-05-01 NOTE — Patient Instructions (Signed)

## 2015-05-02 ENCOUNTER — Other Ambulatory Visit: Payer: Self-pay | Admitting: Obstetrics & Gynecology

## 2015-05-02 ENCOUNTER — Telehealth: Payer: Self-pay | Admitting: *Deleted

## 2015-05-02 DIAGNOSIS — R0981 Nasal congestion: Secondary | ICD-10-CM

## 2015-05-02 MED ORDER — LORATADINE 10 MG PO TABS: 10.0000 mg | ORAL_TABLET | Freq: Every day | ORAL | Status: DC | PRN

## 2015-05-02 NOTE — Telephone Encounter (Signed)
Could not find note referring to any new prescription, was seen by Illene BolusLori Clemmons CNM. Called patient, asked what prescriptions she was looking for. Patient stated that she was told she would get a prescription for claritin and something for cough. Patient is congested. Spoke with Dr Debroah LoopArnold who reviewed her chart and ordered claritin. However since there was no mention of cough he did not order anything for that. I let the patient know that the claritin was sent in. She asked what else was safe to take. Per ob protocol, I recommended plain mucinex for congestion and plain robitussin for cough. Also told her to avoid anything with phenylephrine. Patient voiced understanding.

## 2015-05-02 NOTE — Telephone Encounter (Signed)
Patient called clinic, was seen 3/9 and thought that the provider was sending a prescription to her pharmacy. However the pharmacy never received it. Please call.

## 2015-05-15 ENCOUNTER — Ambulatory Visit (INDEPENDENT_AMBULATORY_CARE_PROVIDER_SITE_OTHER): Payer: Medicaid Other | Admitting: Obstetrics & Gynecology

## 2015-05-15 VITALS — BP 140/70 | HR 89 | Temp 98.7°F | Wt 174.3 lb

## 2015-05-15 DIAGNOSIS — R51 Headache: Secondary | ICD-10-CM

## 2015-05-15 DIAGNOSIS — O09522 Supervision of elderly multigravida, second trimester: Secondary | ICD-10-CM

## 2015-05-15 DIAGNOSIS — O26899 Other specified pregnancy related conditions, unspecified trimester: Secondary | ICD-10-CM

## 2015-05-15 DIAGNOSIS — O10911 Unspecified pre-existing hypertension complicating pregnancy, first trimester: Secondary | ICD-10-CM

## 2015-05-15 DIAGNOSIS — O10912 Unspecified pre-existing hypertension complicating pregnancy, second trimester: Secondary | ICD-10-CM

## 2015-05-15 DIAGNOSIS — O26891 Other specified pregnancy related conditions, first trimester: Secondary | ICD-10-CM

## 2015-05-15 DIAGNOSIS — O26892 Other specified pregnancy related conditions, second trimester: Secondary | ICD-10-CM

## 2015-05-15 DIAGNOSIS — R109 Unspecified abdominal pain: Secondary | ICD-10-CM

## 2015-05-15 LAB — POCT URINALYSIS DIP (DEVICE)
Bilirubin Urine: NEGATIVE
GLUCOSE, UA: 250 mg/dL — AB
HGB URINE DIPSTICK: NEGATIVE
KETONES UR: NEGATIVE mg/dL
Leukocytes, UA: NEGATIVE
Nitrite: NEGATIVE
PH: 6.5 (ref 5.0–8.0)
PROTEIN: NEGATIVE mg/dL
SPECIFIC GRAVITY, URINE: 1.025 (ref 1.005–1.030)
Urobilinogen, UA: 0.2 mg/dL (ref 0.0–1.0)

## 2015-05-15 MED ORDER — LABETALOL HCL 100 MG PO TABS: 100.0000 mg | ORAL_TABLET | Freq: Two times a day (BID) | ORAL | Status: DC

## 2015-05-15 NOTE — Progress Notes (Signed)
Subjective:  Christine Mueller is a 40 y.o. G2P0010 at 2655w2d being seen today for ongoing prenatal care.  She is currently monitored for the following issues for this high-risk pregnancy and has Chronic hypertension during pregnancy, antepartum; Advanced maternal age in multigravida; COPD (chronic obstructive pulmonary disease) (HCC); Asthma; Smoker; and Needs flu shot on her problem list.  Patient reports no complaints.  Contractions: Not present. Vag. Bleeding: None.  Movement: Present. Denies leaking of fluid.   The following portions of the patient's history were reviewed and updated as appropriate: allergies, current medications, past family history, past medical history, past social history, past surgical history and problem list. Problem list updated.  Objective:   Filed Vitals:   05/15/15 0829  BP: 140/70  Pulse: 89  Temp: 98.7 F (37.1 C)  Weight: 174 lb 4.8 oz (79.062 kg)    Fetal Status: Fetal Heart Rate (bpm): 155   Movement: Present     General:  Alert, oriented and cooperative. Patient is in no acute distress.  Skin: Skin is warm and dry. No rash noted.   Cardiovascular: Normal heart rate noted  Respiratory: Normal respiratory effort, no problems with respiration noted  Abdomen: Soft, gravid, appropriate for gestational age. Pain/Pressure: Absent     Pelvic: Vag. Bleeding: None     Cervical exam deferred        Extremities: Normal range of motion.  Edema: None  Mental Status: Normal mood and affect. Normal behavior. Normal judgment and thought content.   Urinalysis: Urine Protein: Negative Urine Glucose: 2+  Assessment and Plan:  Pregnancy: G2P0010 at 6955w2d  There are no diagnoses linked to this encounter. Preterm labor symptoms and general obstetric precautions including but not limited to vaginal bleeding, contractions, leaking of fluid and fetal movement were reviewed in detail with the patient. Please refer to After Visit Summary for other counseling  recommendations.  2 weeks  Adam PhenixJames G Hermela Hardt, MD

## 2015-05-15 NOTE — Patient Instructions (Signed)

## 2015-05-20 ENCOUNTER — Encounter (HOSPITAL_COMMUNITY): Payer: Self-pay

## 2015-05-20 ENCOUNTER — Inpatient Hospital Stay (HOSPITAL_COMMUNITY)
Admission: AD | Admit: 2015-05-20 | Discharge: 2015-05-20 | Disposition: A | Discharge status: Home / Self Care | Payer: Medicaid Other | Source: Ambulatory Visit | Attending: Family Medicine | Admitting: Family Medicine

## 2015-05-20 DIAGNOSIS — Z79899 Other long term (current) drug therapy: Secondary | ICD-10-CM | POA: Diagnosis not present

## 2015-05-20 DIAGNOSIS — O219 Vomiting of pregnancy, unspecified: Secondary | ICD-10-CM | POA: Insufficient documentation

## 2015-05-20 DIAGNOSIS — K219 Gastro-esophageal reflux disease without esophagitis: Secondary | ICD-10-CM

## 2015-05-20 DIAGNOSIS — F17210 Nicotine dependence, cigarettes, uncomplicated: Secondary | ICD-10-CM | POA: Diagnosis not present

## 2015-05-20 DIAGNOSIS — R112 Nausea with vomiting, unspecified: Secondary | ICD-10-CM | POA: Diagnosis present

## 2015-05-20 DIAGNOSIS — I10 Essential (primary) hypertension: Secondary | ICD-10-CM | POA: Insufficient documentation

## 2015-05-20 DIAGNOSIS — J45909 Unspecified asthma, uncomplicated: Secondary | ICD-10-CM | POA: Diagnosis not present

## 2015-05-20 DIAGNOSIS — Z880 Allergy status to penicillin: Secondary | ICD-10-CM | POA: Diagnosis not present

## 2015-05-20 DIAGNOSIS — Z3A24 24 weeks gestation of pregnancy: Secondary | ICD-10-CM | POA: Diagnosis not present

## 2015-05-20 DIAGNOSIS — Z7982 Long term (current) use of aspirin: Secondary | ICD-10-CM | POA: Diagnosis not present

## 2015-05-20 DIAGNOSIS — J449 Chronic obstructive pulmonary disease, unspecified: Secondary | ICD-10-CM | POA: Diagnosis not present

## 2015-05-20 DIAGNOSIS — O99612 Diseases of the digestive system complicating pregnancy, second trimester: Secondary | ICD-10-CM | POA: Diagnosis not present

## 2015-05-20 DIAGNOSIS — O99332 Smoking (tobacco) complicating pregnancy, second trimester: Secondary | ICD-10-CM | POA: Insufficient documentation

## 2015-05-20 LAB — URINALYSIS, ROUTINE W REFLEX MICROSCOPIC
BILIRUBIN URINE: NEGATIVE
GLUCOSE, UA: NEGATIVE mg/dL
HGB URINE DIPSTICK: NEGATIVE
Ketones, ur: NEGATIVE mg/dL
Nitrite: NEGATIVE
PROTEIN: NEGATIVE mg/dL
Specific Gravity, Urine: 1.01 (ref 1.005–1.030)
pH: 6.5 (ref 5.0–8.0)

## 2015-05-20 LAB — URINE MICROSCOPIC-ADD ON

## 2015-05-20 MED ORDER — GI COCKTAIL ~~LOC~~
30.0000 mL | Freq: Once | ORAL | Status: AC
  Administered 2015-05-20: 30 mL via ORAL
  Filled 2015-05-20: qty 30

## 2015-05-20 MED ORDER — ONDANSETRON 4 MG PO TBDP: 4.0000 mg | ORAL_TABLET | Freq: Four times a day (QID) | ORAL | Status: DC | PRN

## 2015-05-20 MED ORDER — PANTOPRAZOLE SODIUM 20 MG PO TBEC: 20.0000 mg | DELAYED_RELEASE_TABLET | Freq: Every day | ORAL | Status: DC

## 2015-05-20 MED ORDER — ONDANSETRON 8 MG PO TBDP
8.0000 mg | ORAL_TABLET | Freq: Once | ORAL | Status: AC
  Administered 2015-05-20: 8 mg via ORAL
  Filled 2015-05-20: qty 1

## 2015-05-20 NOTE — MAU Provider Note (Signed)
Chief Complaint:  Emesis During Pregnancy   First Provider Initiated Contact with Patient 05/20/15 1957      Christine Mueller is a 40 y.o. G2P0010 at 23w0dwho presents to maternity admissions reporting nausea and vomiting which started today.  States has nausea medicine (Phenergan) at home but has not taken it since October "because it didn't work".  Also c/o acid reflux pain and burning.  Did not try any antacids. She reports good fetal movement, denies LOF, vaginal bleeding, vaginal itching/burning, urinary symptoms, h/a, dizziness, n/v, diarrhea, constipation or fever/chills.  She denies headache, visual changes or abdominal pain.  Emesis  This is a new problem. The current episode started yesterday. The problem occurs less than 2 times per day. The problem has been unchanged. There has been no fever. Associated symptoms include chest pain (burning epigastric and lower esophageal area). Pertinent negatives include no abdominal pain, chills, diarrhea, dizziness, fever or myalgias. She has tried nothing for the symptoms.   RN Note:  Expand All Collapse All   Pt c/o nausea and vomiting throughout the day today. States that it "feels like something is burning in throat and in stomach." Denies diarrhea, vaginal bleeding or pain. +FM.            Past Medical History: Past Medical History  Diagnosis Date  . Hypertension   . COPD (chronic obstructive pulmonary disease) (HCC)   . Asthma     Past obstetric history: OB History  Gravida Para Term Preterm AB SAB TAB Ectopic Multiple Living  0    # Outcome Date GA Lbr Len/2nd Weight Sex Delivery Anes PTL Lv  2 Current           1 SAB 2008 [redacted]w[redacted]d             Past Surgical History: Past Surgical History  Procedure Laterality Date  . Wisdom teeth removal      Family History: Family History  Problem Relation Age of Onset  . Diabetes Mother   . Hypertension Mother     Social History: Social History  Substance Use  Topics  . Smoking status: Current Every Day Smoker -- 1.00 packs/day for 15 years    Types: Cigarettes  . Smokeless tobacco: Never Used     Comment: Discussed need to quit for baby, asthma and COPD. Information given  . Alcohol Use: No    Allergies:  Allergies  Allergen Reactions  . Penicillins Swelling    Has patient had a PCN reaction causing immediate rash, facial/tongue/throat swelling, SOB or lightheadedness with hypotension: Yes Has patient had a PCN reaction causing severe rash involving mucus membranes or skin necrosis: No Has patient had a PCN reaction that required hospitalization Yes Has patient had a PCN reaction occurring within the last 10 years: No If all of the above answers are "NO", then may proceed with Cephalosporin use.     Meds:  Prescriptions prior to admission  Medication Sig Dispense Refill Last Dose  . albuterol (PROVENTIL HFA;VENTOLIN HFA) 108 (90 Base) MCG/ACT inhaler Inhale 2 puffs into the lungs every 6 (six) hours as needed for wheezing. 1 Inhaler 1 05/20/2015 at Unknown time  . aspirin 81 MG chewable tablet Chew 1 tablet (81 mg total) by mouth daily. 30 tablet 2 05/20/2015 at Unknown time  . labetalol (NORMODYNE) 100 MG tablet Take 1 tablet (100 mg total) by mouth 2 (two) times daily. 60 tablet 2 05/20/2015 at 0830  .  loratadine (CLARITIN) 10 MG tablet Take 1 tablet (10 mg total) by mouth daily as needed for allergies. 20 tablet 1 Past Week at Unknown time  . nicotine (NICODERM CQ - DOSED IN MG/24 HOURS) 21 mg/24hr patch Place 21 mg onto the skin daily.   05/20/2015 at Unknown time  . Prenatal Vit-Fe Fumarate-FA (PRENATAL MULTIVITAMIN) TABS tablet Take 1 tablet by mouth daily at 12 noon.   05/20/2015 at Unknown time    I have reviewed patient's Past Medical Hx, Surgical Hx, Family Hx, Social Hx, medications and allergies.   ROS:  Review of Systems  Constitutional: Negative for fever, chills and fatigue.  Respiratory: Negative for shortness of breath and  wheezing.   Cardiovascular: Positive for chest pain (burning epigastric and lower esophageal area).  Gastrointestinal: Positive for nausea and vomiting. Negative for abdominal pain, diarrhea and constipation.  Genitourinary: Negative for dysuria, vaginal bleeding, vaginal discharge and pelvic pain.  Musculoskeletal: Negative for myalgias and back pain.  Neurological: Negative for dizziness.   Other systems negative  Physical Exam  Patient Vitals for the past 24 hrs:  BP Temp Temp src Pulse Resp SpO2 Height Weight  05/20/15 1926 125/63 mmHg 98.7 F (37.1 C) Oral 86 18 100 % 5' 3.5" (1.613 m) 173 lb (78.472 kg)   Constitutional: Well-developed, well-nourished female in no acute distress.  Cardiovascular: normal rate and rhythm Respiratory: normal effort, clear to auscultation bilaterally GI: Abd soft, non-tender, gravid appropriate for gestational age.   No rebound or guarding. MS: Extremities nontender, no edema, normal ROM Neurologic: Alert and oriented x 4.  GU: Neg CVAT.   FHT:  Baseline 140 , moderate variability, accelerations present, no decelerations Contractions:  Rare   Labs: Results for orders placed or performed during the hospital encounter of 05/20/15 (from the past 24 hour(s))  Urinalysis, Routine w reflex microscopic (not at [facility])     Status: Abnormal   Collection Time: 05/20/15  7:14 PM  Result Value Ref Range   Color, Urine YELLOW YELLOW   APPearance CLEAR CLEAR   Specific Gravity, Urine 1.010 1.005 - 1.030   pH 6.5 5.0 - 8.0   Glucose, UA NEGATIVE NEGATIVE mg/dL   Hgb urine dipstick NEGATIVE NEGATIVE   Bilirubin Urine NEGATIVE NEGATIVE   Ketones, ur NEGATIVE NEGATIVE mg/dL   Protein, ur NEGATIVE NEGATIVE mg/dL   Nitrite NEGATIVE NEGATIVE   Leukocytes, UA TRACE (A) NEGATIVE  Urine microscopic-add on     Status: Abnormal   Collection Time: 05/20/15  7:14 PM  Result Value Ref Range   Squamous Epithelial / LPF 0-5 (A) NONE SEEN   WBC, UA 0-5 0 - 5 WBC/hpf    RBC / HPF 0-5 0 - 5 RBC/hpf   Bacteria, UA RARE (A) NONE SEEN   Urine-Other AMORPHOUS URATES/PHOSPHATES    B/NEG/-- (11/30 1611)  Imaging:  No results found.  MAU Course/MDM: I have ordered labs and reviewed results.  NST reviewed  Given GI cocktail and Zofran by mouth with good relief. Able to tolerate POs   Treatments in MAU included antiemetic and GI cocktail.    Assessment: SIUP at [redacted]w[redacted]d  Nausea and vomiting GERD  Plan: Discharge home Rx zofran for nausea Rx Protonix for acid reflux Preterm Labor precautions and fetal kick counts Follow up in Office for prenatal visits and recheck    Medication List    ASK your doctor about these medications        albuterol 108 (90 Base) MCG/ACT inhaler  Commonly known as:  PROVENTIL HFA;VENTOLIN HFA  Inhale 2 puffs into the lungs every 6 (six) hours as needed for wheezing.     aspirin 81 MG chewable tablet  Chew 1 tablet (81 mg total) by mouth daily.     labetalol 100 MG tablet  Commonly known as:  NORMODYNE  Take 1 tablet (100 mg total) by mouth 2 (two) times daily.     loratadine 10 MG tablet  Commonly known as:  CLARITIN  Take 1 tablet (10 mg total) by mouth daily as needed for allergies.     nicotine 21 mg/24hr patch  Commonly known as:  NICODERM CQ - dosed in mg/24 hours  Place 21 mg onto the skin daily.     prenatal multivitamin Tabs tablet  Take 1 tablet by mouth daily at 12 noon.       Pt stable at time of discharge.  Encouraged to return here or to other Urgent Care/ED if she develops worsening of symptoms, increase in pain, fever, or other concerning symptoms.      Wynelle BourgeoisMarie Williams CNM, MSN Certified Nurse-Midwife 05/20/2015 8:30 PM

## 2015-05-20 NOTE — MAU Note (Signed)
Pt c/o nausea and vomiting throughout the day today. States that it "feels like something is burning in throat and in stomach." Denies diarrhea, vaginal bleeding or pain. +FM.

## 2015-05-20 NOTE — Discharge Instructions (Signed)
Nausea and Vomiting °Nausea is a sick feeling that often comes before throwing up (vomiting). Vomiting is a reflex where stomach contents come out of your mouth. Vomiting can cause severe loss of body fluids (dehydration). Children and elderly adults can become dehydrated quickly, especially if they also have diarrhea. Nausea and vomiting are symptoms of a condition or disease. It is important to find the cause of your symptoms. °CAUSES  °· Direct irritation of the stomach lining. This irritation can result from increased acid production (gastroesophageal reflux disease), infection, food poisoning, taking certain medicines (such as nonsteroidal anti-inflammatory drugs), alcohol use, or tobacco use. °· Signals from the brain. These signals could be caused by a headache, heat exposure, an inner ear disturbance, increased pressure in the brain from injury, infection, a tumor, or a concussion, pain, emotional stimulus, or metabolic problems. °· An obstruction in the gastrointestinal tract (bowel obstruction). °· Illnesses such as diabetes, hepatitis, gallbladder problems, appendicitis, kidney problems, cancer, sepsis, atypical symptoms of a heart attack, or eating disorders. °· Medical treatments such as chemotherapy and radiation. °· Receiving medicine that makes you sleep (general anesthetic) during surgery. °DIAGNOSIS °Your caregiver may ask for tests to be done if the problems do not improve after a few days. Tests may also be done if symptoms are severe or if the reason for the nausea and vomiting is not clear. Tests may include: °· Urine tests. °· Blood tests. °· Stool tests. °· Cultures (to look for evidence of infection). °· X-rays or other imaging studies. °Test results can help your caregiver make decisions about treatment or the need for additional tests. °TREATMENT °You need to stay well hydrated. Drink frequently but in small amounts. You may wish to drink water, sports drinks, clear broth, or eat frozen  ice pops or gelatin dessert to help stay hydrated. When you eat, eating slowly may help prevent nausea. There are also some antinausea medicines that may help prevent nausea. °HOME CARE INSTRUCTIONS  °· Take all medicine as directed by your caregiver. °· If you do not have an appetite, do not force yourself to eat. However, you must continue to drink fluids. °· If you have an appetite, eat a normal diet unless your caregiver tells you differently. °¨ Eat a variety of complex carbohydrates (rice, wheat, potatoes, bread), lean meats, yogurt, fruits, and vegetables. °¨ Avoid high-fat foods because they are more difficult to digest. °· Drink enough water and fluids to keep your urine clear or pale yellow. °· If you are dehydrated, ask your caregiver for specific rehydration instructions. Signs of dehydration may include: °¨ Severe thirst. °¨ Dry lips and mouth. °¨ Dizziness. °¨ Dark urine. °¨ Decreasing urine frequency and amount. °¨ Confusion. °¨ Rapid breathing or pulse. °SEEK IMMEDIATE MEDICAL CARE IF:  °· You have blood or brown flecks (like coffee grounds) in your vomit. °· You have black or bloody stools. °· You have a severe headache or stiff neck. °· You are confused. °· You have severe abdominal pain. °· You have chest pain or trouble breathing. °· You do not urinate at least once every 8 hours. °· You develop cold or clammy skin. °· You continue to vomit for longer than 24 to 48 hours. °· You have a fever. °MAKE SURE YOU:  °· Understand these instructions. °· Will watch your condition. °· Will get help right away if you are not doing well or get worse. °  °This information is not intended to replace advice given to you by your health care provider. Make sure   you discuss any questions you have with your health care provider. °  °Document Released: 02/08/2005 Document Revised: 05/03/2011 Document Reviewed: 07/08/2010 °Elsevier Interactive Patient Education ©2016 Elsevier Inc. ° ° ° ° °Gastroesophageal Reflux  Disease, Adult °Normally, food travels down the esophagus and stays in the stomach to be digested. However, when a person has gastroesophageal reflux disease (GERD), food and stomach acid move back up into the esophagus. When this happens, the esophagus becomes sore and inflamed. Over time, GERD can create small holes (ulcers) in the lining of the esophagus.  °CAUSES °This condition is caused by a problem with the muscle between the esophagus and the stomach (lower esophageal sphincter, or LES). Normally, the LES muscle closes after food passes through the esophagus to the stomach. When the LES is weakened or abnormal, it does not close properly, and that allows food and stomach acid to go back up into the esophagus. The LES can be weakened by certain dietary substances, medicines, and medical conditions, including: °· Tobacco use. °· Pregnancy. °· Having a hiatal hernia. °· Heavy alcohol use. °· Certain foods and beverages, such as coffee, chocolate, onions, and peppermint. °RISK FACTORS °This condition is more likely to develop in: °· People who have an increased body weight. °· People who have connective tissue disorders. °· People who use NSAID medicines. °SYMPTOMS °Symptoms of this condition include: °· Heartburn. °· Difficult or painful swallowing. °· The feeling of having a lump in the throat. °· A bitter taste in the mouth. °· Bad breath. °· Having a large amount of saliva. °· Having an upset or bloated stomach. °· Belching. °· Chest pain. °· Shortness of breath or wheezing. °· Ongoing (chronic) cough or a night-time cough. °· Wearing away of tooth enamel. °· Weight loss. °Different conditions can cause chest pain. Make sure to see your health care provider if you experience chest pain. °DIAGNOSIS °Your health care provider will take a medical history and perform a physical exam. To determine if you have mild or severe GERD, your health care provider may also monitor how you respond to treatment. You may  also have other tests, including: °· An endoscopy to examine your stomach and esophagus with a small camera. °· A test that measures the acidity level in your esophagus. °· A test that measures how much pressure is on your esophagus. °· A barium swallow or modified barium swallow to show the shape, size, and functioning of your esophagus. °TREATMENT °The goal of treatment is to help relieve your symptoms and to prevent complications. Treatment for this condition may vary depending on how severe your symptoms are. Your health care provider may recommend: °· Changes to your diet. °· Medicine. °· Surgery. °HOME CARE INSTRUCTIONS °Diet °· Follow a diet as recommended by your health care provider. This may involve avoiding foods and drinks such as: °¨ Coffee and tea (with or without caffeine). °¨ Drinks that contain alcohol. °¨ Energy drinks and sports drinks. °¨ Carbonated drinks or sodas. °¨ Chocolate and cocoa. °¨ Peppermint and mint flavorings. °¨ Garlic and onions. °¨ Horseradish. °¨ Spicy and acidic foods, including peppers, chili powder, curry powder, vinegar, hot sauces, and barbecue sauce. °¨ Citrus fruit juices and citrus fruits, such as oranges, lemons, and limes. °¨ Tomato-based foods, such as red sauce, chili, salsa, and pizza with red sauce. °¨ Fried and fatty foods, such as donuts, french fries, potato chips, and high-fat dressings. °¨ High-fat meats, such as hot dogs and fatty cuts of red and white meats, such as rib eye steak,   High-fat dairy items, such as whole milk, butter, and cream cheese.  Eat small, frequent meals instead of large meals.  Avoid drinking large amounts of liquid with your meals.  Avoid eating meals during the 2-3 hours before bedtime.  Avoid lying down right after you eat.  Do not exercise right after you eat. General Instructions  Pay attention to any changes in your symptoms.  Take over-the-counter and prescription medicines only as told by your  health care provider. Do not take aspirin, ibuprofen, or other NSAIDs unless your health care provider told you to do so.  Do not use any tobacco products, including cigarettes, chewing tobacco, and e-cigarettes. If you need help quitting, ask your health care provider.  Wear loose-fitting clothing. Do not wear anything tight around your waist that causes pressure on your abdomen.  Raise (elevate) the head of your bed 6 inches (15cm).  Try to reduce your stress, such as with yoga or meditation. If you need help reducing stress, ask your health care provider.  If you are overweight, reduce your weight to an amount that is healthy for you. Ask your health care provider for guidance about a safe weight loss goal.  Keep all follow-up visits as told by your health care provider. This is important. SEEK MEDICAL CARE IF:  You have new symptoms.  You have unexplained weight loss.  You have difficulty swallowing, or it hurts to swallow.  You have wheezing or a persistent cough.  Your symptoms do not improve with treatment.  You have a hoarse voice. SEEK IMMEDIATE MEDICAL CARE IF:  You have pain in your arms, neck, jaw, teeth, or back.  You feel sweaty, dizzy, or light-headed.  You have chest pain or shortness of breath.  You vomit and your vomit looks like blood or coffee grounds.  You faint.  Your stool is bloody or black.  You cannot swallow, drink, or eat.   This information is not intended to replace advice given to you by your health care provider. Make sure you discuss any questions you have with your health care provider.   Document Released: 11/18/2004 Document Revised: 10/30/2014 Document Reviewed: 06/05/2014 Elsevier Interactive Patient Education [employer]2016 Elsevier Inc.

## 2015-05-21 ENCOUNTER — Telehealth: Payer: Self-pay | Admitting: *Deleted

## 2015-05-21 NOTE — Telephone Encounter (Addendum)
Pt left message yesterday @ 1536 stating that she has not been able to keep anything down since the previous Neytiri Asche. She requests a call back.   3/29  0825  Called pt and she stated that she had come to the hospital for care last evening due to the nausea. She received a "cocktail" while she was here and 2 prescriptions which she has not picked up yet. She is feeling better.  Pt was encouraged to obtain prescriptions today and to take as directed.  Pt agreed and voiced understanding.

## 2015-05-27 ENCOUNTER — Encounter (HOSPITAL_COMMUNITY): Payer: Self-pay | Admitting: *Deleted

## 2015-05-27 ENCOUNTER — Inpatient Hospital Stay (HOSPITAL_COMMUNITY)
Admission: AD | Admit: 2015-05-27 | Discharge: 2015-05-27 | Discharge status: Against Medical Advice | Payer: Medicaid Other | Source: Ambulatory Visit | Attending: Obstetrics & Gynecology | Admitting: Obstetrics & Gynecology

## 2015-05-27 DIAGNOSIS — Y939 Activity, unspecified: Secondary | ICD-10-CM | POA: Insufficient documentation

## 2015-05-27 DIAGNOSIS — O9A212 Injury, poisoning and certain other consequences of external causes complicating pregnancy, second trimester: Secondary | ICD-10-CM | POA: Diagnosis not present

## 2015-05-27 DIAGNOSIS — M549 Dorsalgia, unspecified: Secondary | ICD-10-CM | POA: Insufficient documentation

## 2015-05-27 DIAGNOSIS — J45909 Unspecified asthma, uncomplicated: Secondary | ICD-10-CM | POA: Diagnosis not present

## 2015-05-27 DIAGNOSIS — T1490 Injury, unspecified: Secondary | ICD-10-CM | POA: Diagnosis not present

## 2015-05-27 DIAGNOSIS — I10 Essential (primary) hypertension: Secondary | ICD-10-CM | POA: Diagnosis not present

## 2015-05-27 DIAGNOSIS — Z23 Encounter for immunization: Secondary | ICD-10-CM

## 2015-05-27 DIAGNOSIS — Z7982 Long term (current) use of aspirin: Secondary | ICD-10-CM | POA: Insufficient documentation

## 2015-05-27 DIAGNOSIS — Z880 Allergy status to penicillin: Secondary | ICD-10-CM | POA: Insufficient documentation

## 2015-05-27 DIAGNOSIS — F17210 Nicotine dependence, cigarettes, uncomplicated: Secondary | ICD-10-CM | POA: Insufficient documentation

## 2015-05-27 DIAGNOSIS — O26892 Other specified pregnancy related conditions, second trimester: Secondary | ICD-10-CM | POA: Insufficient documentation

## 2015-05-27 DIAGNOSIS — Z3A25 25 weeks gestation of pregnancy: Secondary | ICD-10-CM | POA: Diagnosis not present

## 2015-05-27 DIAGNOSIS — Y929 Unspecified place or not applicable: Secondary | ICD-10-CM | POA: Insufficient documentation

## 2015-05-27 DIAGNOSIS — J449 Chronic obstructive pulmonary disease, unspecified: Secondary | ICD-10-CM | POA: Diagnosis not present

## 2015-05-27 DIAGNOSIS — Z79899 Other long term (current) drug therapy: Secondary | ICD-10-CM | POA: Insufficient documentation

## 2015-05-27 DIAGNOSIS — O10919 Unspecified pre-existing hypertension complicating pregnancy, unspecified trimester: Secondary | ICD-10-CM

## 2015-05-27 LAB — URINALYSIS, ROUTINE W REFLEX MICROSCOPIC
BILIRUBIN URINE: NEGATIVE
GLUCOSE, UA: NEGATIVE mg/dL
HGB URINE DIPSTICK: NEGATIVE
Ketones, ur: 15 mg/dL — AB
Leukocytes, UA: NEGATIVE
Nitrite: NEGATIVE
PROTEIN: NEGATIVE mg/dL
SPECIFIC GRAVITY, URINE: 1.02 (ref 1.005–1.030)
pH: 6 (ref 5.0–8.0)
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
## 2015-05-27 NOTE — MAU Note (Signed)
Patient states she cannot stay for 4 hours of EFM due to having to pick up her child. Artelia LarocheM. Williams, CNM discussed risks and possible complications and adverse outcomes that could occur. Patient insists that she must leave, stating she feels OK with baby's heartbeat and she has an U/S tomorrow.
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
## 2015-05-28 ENCOUNTER — Other Ambulatory Visit (HOSPITAL_COMMUNITY): Payer: Self-pay | Admitting: Maternal and Fetal Medicine

## 2015-05-28 ENCOUNTER — Ambulatory Visit (HOSPITAL_COMMUNITY)
Admission: RE | Admit: 2015-05-28 | Discharge: 2015-05-28 | Disposition: A | Discharge status: Home / Self Care | Payer: Medicaid Other | Source: Ambulatory Visit | Attending: Obstetrics & Gynecology | Admitting: Obstetrics & Gynecology

## 2015-05-28 ENCOUNTER — Encounter (HOSPITAL_COMMUNITY): Payer: Self-pay

## 2015-05-28 DIAGNOSIS — Z3A25 25 weeks gestation of pregnancy: Secondary | ICD-10-CM

## 2015-05-28 DIAGNOSIS — O09522 Supervision of elderly multigravida, second trimester: Secondary | ICD-10-CM

## 2015-05-28 DIAGNOSIS — O99332 Smoking (tobacco) complicating pregnancy, second trimester: Secondary | ICD-10-CM | POA: Diagnosis not present

## 2015-05-28 DIAGNOSIS — O10919 Unspecified pre-existing hypertension complicating pregnancy, unspecified trimester: Secondary | ICD-10-CM

## 2015-05-28 DIAGNOSIS — O10012 Pre-existing essential hypertension complicating pregnancy, second trimester: Secondary | ICD-10-CM | POA: Diagnosis not present

## 2015-05-29 ENCOUNTER — Encounter: Payer: Self-pay | Admitting: Family

## 2015-05-29 ENCOUNTER — Ambulatory Visit (INDEPENDENT_AMBULATORY_CARE_PROVIDER_SITE_OTHER): Payer: Medicaid Other | Admitting: Family

## 2015-05-29 VITALS — BP 122/73 | HR 92 | Temp 98.8°F | Wt 175.1 lb

## 2015-05-29 DIAGNOSIS — O0992 Supervision of high risk pregnancy, unspecified, second trimester: Secondary | ICD-10-CM

## 2015-05-29 DIAGNOSIS — O0990 Supervision of high risk pregnancy, unspecified, unspecified trimester: Secondary | ICD-10-CM | POA: Insufficient documentation

## 2015-05-29 DIAGNOSIS — O10919 Unspecified pre-existing hypertension complicating pregnancy, unspecified trimester: Secondary | ICD-10-CM

## 2015-05-29 DIAGNOSIS — O09522 Supervision of elderly multigravida, second trimester: Secondary | ICD-10-CM

## 2015-05-29 DIAGNOSIS — O10912 Unspecified pre-existing hypertension complicating pregnancy, second trimester: Secondary | ICD-10-CM

## 2015-05-29 LAB — POCT URINALYSIS DIP (DEVICE)
Bilirubin Urine: NEGATIVE
Glucose, UA: 100 mg/dL — AB
Hgb urine dipstick: NEGATIVE
KETONES UR: NEGATIVE mg/dL
Nitrite: NEGATIVE
PH: 7 (ref 5.0–8.0)
PROTEIN: NEGATIVE mg/dL
SPECIFIC GRAVITY, URINE: 1.02 (ref 1.005–1.030)
UROBILINOGEN UA: 0.2 mg/dL (ref 0.0–1.0)

## 2015-05-29 NOTE — Progress Notes (Signed)
Subjective:  Christine Mueller is a 40 y.o. G2P0010 at 4676w2d being seen today for ongoing prenatal care.  She is currently monitored for the following issues for this high-risk pregnancy and has Chronic hypertension during pregnancy, antepartum; Advanced maternal age in multigravida; COPD (chronic obstructive pulmonary disease) (HCC); Asthma; Smoker; and Supervision of high risk pregnancy, antepartum on her problem list.  Patient reports no complaints.  Contractions: Not present.  .  Movement: Present. Denies leaking of fluid.   The following portions of the patient's history were reviewed and updated as appropriate: allergies, current medications, past family history, past medical history, past social history, past surgical history and problem list. Problem list updated.  Objective:   Filed Vitals:   05/29/15 0903  BP: 122/73  Pulse: 92  Temp: 98.8 F (37.1 C)  Weight: 175 lb 1.6 oz (79.425 kg)    Fetal Status: Fetal Heart Rate (bpm): 155 Fundal Height: 25 cm Movement: Present     General:  Alert, oriented and cooperative. Patient is in no acute distress.  Skin: Skin is warm and dry. No rash noted.   Cardiovascular: Normal heart rate noted  Respiratory: Normal respiratory effort, no problems with respiration noted  Abdomen: Soft, gravid, appropriate for gestational age. Pain/Pressure: Present     Pelvic:       Cervical exam deferred        Extremities: Normal range of motion.  Edema: None  Mental Status: Normal mood and affect. Normal behavior. Normal judgment and thought content.   Urinalysis: Urine Protein: Negative Urine Glucose: 1+  Assessment and Plan:  Pregnancy: G2P0010 at 2876w2d  1. Chronic hypertension during pregnancy, antepartum - Continue current meds - Reviewed growth ultrasound from 4/5 (75%ile)  2. Supervision of high risk pregnancy, antepartum, second trimester - Monitor blood pressure - discussed third trimester labs for next visit  3. Advanced maternal age  in multigravida, second trimester - NIPS wnl  Preterm labor symptoms and general obstetric precautions including but not limited to vaginal bleeding, contractions, leaking of fluid and fetal movement were reviewed in detail with the patient. Please refer to After Visit Summary for other counseling recommendations.  Return in about 2 weeks (around 06/12/2015).   Eino FarberWalidah Kennith GainN Karim, CNM

## 2015-06-12 ENCOUNTER — Encounter: Payer: Self-pay | Admitting: Family Medicine

## 2015-06-12 ENCOUNTER — Ambulatory Visit: Payer: Medicaid Other | Admitting: Family Medicine

## 2015-06-12 VITALS — BP 129/75 | HR 100 | Wt 175.0 lb

## 2015-06-12 DIAGNOSIS — O10919 Unspecified pre-existing hypertension complicating pregnancy, unspecified trimester: Secondary | ICD-10-CM

## 2015-06-12 LAB — CBC
HCT: 34.1 % — ABNORMAL LOW (ref 35.0–45.0)
Hemoglobin: 11 g/dL — ABNORMAL LOW (ref 11.7–15.5)
MCH: 30.2 pg (ref 27.0–33.0)
MCHC: 32.3 g/dL (ref 32.0–36.0)
MCV: 93.7 fL (ref 80.0–100.0)
MPV: 9.9 fL (ref 7.5–12.5)
PLATELETS: 313 10*3/uL (ref 140–400)
RBC: 3.64 MIL/uL — AB (ref 3.80–5.10)
RDW: 13 % (ref 11.0–15.0)
WBC: 13.5 10*3/uL — AB (ref 3.8–10.8)

## 2015-06-12 LAB — POCT URINALYSIS DIP (DEVICE)
Bilirubin Urine: NEGATIVE
Glucose, UA: NEGATIVE mg/dL
HGB URINE DIPSTICK: NEGATIVE
Ketones, ur: NEGATIVE mg/dL
NITRITE: NEGATIVE
Protein, ur: NEGATIVE mg/dL
Specific Gravity, Urine: 1.02 (ref 1.005–1.030)
UROBILINOGEN UA: 0.2 mg/dL (ref 0.0–1.0)
pH: 7 (ref 5.0–8.0)

## 2015-06-12 NOTE — Progress Notes (Signed)
Patient left without being seen.

## 2015-06-12 NOTE — Progress Notes (Signed)
1 hr due at 10:33

## 2015-06-13 ENCOUNTER — Encounter: Payer: Self-pay | Admitting: Family Medicine

## 2015-06-13 DIAGNOSIS — Z8632 Personal history of gestational diabetes: Secondary | ICD-10-CM | POA: Insufficient documentation

## 2015-06-13 LAB — GLUCOSE TOLERANCE, 1 HOUR (50G) W/O FASTING: GLUCOSE, 1 HR, GESTATIONAL: 262 mg/dL — AB (ref <140)

## 2015-06-13 LAB — HIV ANTIBODY (ROUTINE TESTING W REFLEX): HIV: NONREACTIVE

## 2015-06-13 LAB — RPR

## 2015-06-19 ENCOUNTER — Ambulatory Visit (INDEPENDENT_AMBULATORY_CARE_PROVIDER_SITE_OTHER): Payer: Medicaid Other | Admitting: Obstetrics and Gynecology

## 2015-06-19 VITALS — BP 130/81 | HR 103 | Wt 174.1 lb

## 2015-06-19 DIAGNOSIS — Z6791 Unspecified blood type, Rh negative: Secondary | ICD-10-CM | POA: Insufficient documentation

## 2015-06-19 DIAGNOSIS — O10913 Unspecified pre-existing hypertension complicating pregnancy, third trimester: Secondary | ICD-10-CM | POA: Diagnosis not present

## 2015-06-19 DIAGNOSIS — O360130 Maternal care for anti-D [Rh] antibodies, third trimester, not applicable or unspecified: Secondary | ICD-10-CM

## 2015-06-19 DIAGNOSIS — O360190 Maternal care for anti-D [Rh] antibodies, unspecified trimester, not applicable or unspecified: Secondary | ICD-10-CM

## 2015-06-19 DIAGNOSIS — O24419 Gestational diabetes mellitus in pregnancy, unspecified control: Secondary | ICD-10-CM | POA: Diagnosis not present

## 2015-06-19 DIAGNOSIS — I10 Essential (primary) hypertension: Secondary | ICD-10-CM

## 2015-06-19 DIAGNOSIS — O10912 Unspecified pre-existing hypertension complicating pregnancy, second trimester: Secondary | ICD-10-CM

## 2015-06-19 DIAGNOSIS — Z23 Encounter for immunization: Secondary | ICD-10-CM

## 2015-06-19 DIAGNOSIS — O0990 Supervision of high risk pregnancy, unspecified, unspecified trimester: Secondary | ICD-10-CM

## 2015-06-19 LAB — POCT URINALYSIS DIP (DEVICE)
Bilirubin Urine: NEGATIVE
Glucose, UA: NEGATIVE mg/dL
Ketones, ur: NEGATIVE mg/dL
NITRITE: NEGATIVE
PROTEIN: NEGATIVE mg/dL
SPECIFIC GRAVITY, URINE: 1.02 (ref 1.005–1.030)
UROBILINOGEN UA: 1 mg/dL (ref 0.0–1.0)
pH: 6.5 (ref 5.0–8.0)

## 2015-06-19 MED ORDER — RHO D IMMUNE GLOBULIN 1500 UNIT/2ML IJ SOSY
300.0000 ug | PREFILLED_SYRINGE | Freq: Once | INTRAMUSCULAR | Status: AC
  Administered 2015-06-19: 300 ug via INTRAMUSCULAR

## 2015-06-19 NOTE — Progress Notes (Signed)
tdap given

## 2015-06-19 NOTE — Progress Notes (Signed)
Subjective:  Christine Mueller is a 40 y.o. G2P0010 at 704w2d being seen today for ongoing prenatal care.  She is currently monitored for the following issues for this high-risk pregnancy and has Chronic hypertension during pregnancy, antepartum; Advanced maternal age in multigravida; COPD (chronic obstructive pulmonary disease) (HCC); Asthma; Smoker; Supervision of high risk pregnancy, antepartum; and Gestational diabetes mellitus (GDM), antepartum on her problem list.  Patient reports no complaints.  Contractions: Not present. Vag. Bleeding: None.  Movement: Present. Denies leaking of fluid.   The following portions of the patient's history were reviewed and updated as appropriate: allergies, current medications, past family history, past medical history, past social history, past surgical history and problem list. Problem list updated.  Objective:   Filed Vitals:   06/19/15 0802  BP: 130/81  Pulse: 103  Weight: 174 lb 1.6 oz (78.971 kg)    Fetal Status: Fetal Heart Rate (bpm): 154   Movement: Present     General:  Alert, oriented and cooperative. Patient is in no acute distress.  Skin: Skin is warm and dry. No rash noted.   Cardiovascular: Normal heart rate noted  Respiratory: Normal respiratory effort, no problems with respiration noted  Abdomen: Soft, gravid, appropriate for gestational age. Pain/Pressure: Present     Pelvic: Vag. Bleeding: None     Cervical exam deferred        Extremities: Normal range of motion.  Edema: None  Mental Status: Normal mood and affect. Normal behavior. Normal judgment and thought content.   Urinalysis:      Assessment and Plan:  Pregnancy: G2P0010 at 814w2d  # Rh negative - rhogam today  # Chtn - bp appropriate - continue q4 week u/s  # GDM - dx with very elevated (> 190) 1-hour - no dm educator available today, discovered this after patient left office. Will have clinical staff contact to schedule visit for next week to start checking  glucose 4x daily. Today I gave brief diet and exercise counseling, will need nutritionist visit but pt says no time today  # Pregnancy - tdap today    There are no diagnoses linked to this encounter. Preterm labor symptoms and general obstetric precautions including but not limited to vaginal bleeding, contractions, leaking of fluid and fetal movement were reviewed in detail with the patient. Please refer to After Visit Summary for other counseling recommendations.  No Follow-up on file.   Kathrynn RunningNoah Bedford Ethell Blatchford, MD

## 2015-06-23 ENCOUNTER — Ambulatory Visit: Payer: Medicaid Other | Admitting: Obstetrics and Gynecology

## 2015-06-23 ENCOUNTER — Encounter: Payer: Medicaid Other | Attending: Obstetrics & Gynecology | Admitting: Dietician

## 2015-06-23 ENCOUNTER — Other Ambulatory Visit: Payer: Self-pay | Admitting: Advanced Practice Midwife

## 2015-06-23 DIAGNOSIS — Z029 Encounter for administrative examinations, unspecified: Secondary | ICD-10-CM | POA: Insufficient documentation

## 2015-06-23 DIAGNOSIS — O24410 Gestational diabetes mellitus in pregnancy, diet controlled: Secondary | ICD-10-CM

## 2015-06-23 LAB — POCT URINALYSIS DIP (DEVICE)
Bilirubin Urine: NEGATIVE
Glucose, UA: 250 mg/dL — AB
HGB URINE DIPSTICK: NEGATIVE
Ketones, ur: NEGATIVE mg/dL
LEUKOCYTES UA: NEGATIVE
NITRITE: NEGATIVE
PROTEIN: NEGATIVE mg/dL
UROBILINOGEN UA: 1 mg/dL (ref 0.0–1.0)
pH: 6.5 (ref 5.0–8.0)

## 2015-06-23 NOTE — Progress Notes (Signed)
Patient was not seen by a provider but rather seen by nutritionist and diabetic educator

## 2015-06-25 ENCOUNTER — Encounter (HOSPITAL_COMMUNITY): Payer: Self-pay | Admitting: Certified Nurse Midwife

## 2015-06-25 ENCOUNTER — Encounter: Payer: Self-pay | Admitting: Obstetrics & Gynecology

## 2015-06-25 ENCOUNTER — Ambulatory Visit (HOSPITAL_COMMUNITY)
Admission: RE | Admit: 2015-06-25 | Discharge: 2015-06-25 | Disposition: A | Discharge status: Home / Self Care | Payer: Medicaid Other | Source: Ambulatory Visit | Attending: Obstetrics & Gynecology | Admitting: Obstetrics & Gynecology

## 2015-06-25 ENCOUNTER — Encounter (HOSPITAL_COMMUNITY): Payer: Self-pay

## 2015-06-25 ENCOUNTER — Other Ambulatory Visit (HOSPITAL_COMMUNITY): Payer: Self-pay | Admitting: Maternal and Fetal Medicine

## 2015-06-25 ENCOUNTER — Inpatient Hospital Stay (HOSPITAL_COMMUNITY)
Admission: AD | Admit: 2015-06-25 | Discharge: 2015-06-25 | Disposition: A | Discharge status: Home / Self Care | Payer: Medicaid Other | Source: Ambulatory Visit | Attending: Obstetrics & Gynecology | Admitting: Obstetrics & Gynecology

## 2015-06-25 DIAGNOSIS — O10919 Unspecified pre-existing hypertension complicating pregnancy, unspecified trimester: Secondary | ICD-10-CM

## 2015-06-25 DIAGNOSIS — O99333 Smoking (tobacco) complicating pregnancy, third trimester: Secondary | ICD-10-CM | POA: Insufficient documentation

## 2015-06-25 DIAGNOSIS — O10013 Pre-existing essential hypertension complicating pregnancy, third trimester: Secondary | ICD-10-CM | POA: Diagnosis not present

## 2015-06-25 DIAGNOSIS — Z3A29 29 weeks gestation of pregnancy: Secondary | ICD-10-CM | POA: Diagnosis not present

## 2015-06-25 DIAGNOSIS — F17210 Nicotine dependence, cigarettes, uncomplicated: Secondary | ICD-10-CM | POA: Diagnosis not present

## 2015-06-25 DIAGNOSIS — Z880 Allergy status to penicillin: Secondary | ICD-10-CM | POA: Insufficient documentation

## 2015-06-25 DIAGNOSIS — O09523 Supervision of elderly multigravida, third trimester: Secondary | ICD-10-CM | POA: Diagnosis not present

## 2015-06-25 DIAGNOSIS — O210 Mild hyperemesis gravidarum: Secondary | ICD-10-CM | POA: Insufficient documentation

## 2015-06-25 DIAGNOSIS — Z7982 Long term (current) use of aspirin: Secondary | ICD-10-CM | POA: Insufficient documentation

## 2015-06-25 DIAGNOSIS — K219 Gastro-esophageal reflux disease without esophagitis: Secondary | ICD-10-CM

## 2015-06-25 DIAGNOSIS — O163 Unspecified maternal hypertension, third trimester: Secondary | ICD-10-CM | POA: Diagnosis not present

## 2015-06-25 HISTORY — DX: Gestational diabetes mellitus in pregnancy, unspecified control: O24.419

## 2015-06-25 MED ORDER — PANTOPRAZOLE SODIUM 20 MG PO TBEC: 20.0000 mg | DELAYED_RELEASE_TABLET | Freq: Every day | ORAL | Status: DC

## 2015-06-25 MED ORDER — PANTOPRAZOLE SODIUM 20 MG PO TBEC
20.0000 mg | DELAYED_RELEASE_TABLET | Freq: Every day | ORAL | Status: DC
  Administered 2015-06-25: 20 mg via ORAL
  Filled 2015-06-25 (×2): qty 1

## 2015-06-25 NOTE — MAU Provider Note (Signed)
History     CSN: [id number]  Arrival date and time: 06/25/15 1906    Chief Complaint  Patient presents with  . Emesis   HPI  This is a 40 year old G2P0010 at [redacted]w[redacted]d who presented to the MAU with vomiting starting yesterday morning. She vomited 4-5 times yesterday and 4-5 times today. She has been able to keep down liquids but not solids. She thinks her vomiting is caused by running out of her heartburn medications. She has had vomiting in the past when she doesn't take her heartburn medications. She denies any blood in the vomit. She denies any fevers, diarrhea, or abdominal pain.  She endorses fetal movement. She denies vaginal bleeding or leakage of fluids.  Past Medical History  Diagnosis Date  . Hypertension   . COPD (chronic obstructive pulmonary disease) (HCC)   . Asthma   . Gestational diabetes     Past Surgical History  Procedure Laterality Date  . Wisdom teeth removal      Family History  Problem Relation Age of Onset  . Diabetes Mother   . Hypertension Mother     Social History  Substance Use Topics  . Smoking status: Current Every Day Smoker -- 1.00 packs/day for 15 years    Types: Cigarettes  . Smokeless tobacco: Never Used     Comment: Discussed need to quit for baby, asthma and COPD. Information given  . Alcohol Use: No    Allergies:  Allergies  Allergen Reactions  . Penicillins Swelling    Has patient had a PCN reaction causing immediate rash, facial/tongue/throat swelling, SOB or lightheadedness with hypotension: Yes Has patient had a PCN reaction causing severe rash involving mucus membranes or skin necrosis: No Has patient had a PCN reaction that required hospitalization Yes Has patient had a PCN reaction occurring within the last 10 years: No If all of the above answers are "NO", then may proceed with Cephalosporin use.     Prescriptions prior to admission  Medication Sig Dispense Refill Last Dose  . albuterol (PROVENTIL HFA;VENTOLIN HFA)  108 (90 Base) MCG/ACT inhaler Inhale 2 puffs into the lungs every 6 (six) hours as needed for wheezing. 1 Inhaler 1 Past Week at Unknown time  . aspirin 81 MG chewable tablet Chew 1 tablet (81 mg total) by mouth daily. 30 tablet 2 06/25/2015 at Unknown time  . labetalol (NORMODYNE) 100 MG tablet Take 1 tablet (100 mg total) by mouth 2 (two) times daily. 60 tablet 2 06/25/2015 at 0800  . loratadine (CLARITIN) 10 MG tablet Take 1 tablet (10 mg total) by mouth daily as needed for allergies. 20 tablet 1 06/25/2015 at Unknown time  . nicotine (NICODERM CQ - DOSED IN MG/24 HOURS) 21 mg/24hr patch Place 21 mg onto the skin daily.   Past Week at Unknown time  . pantoprazole (PROTONIX) 20 MG tablet Take 20 mg by mouth daily.   Past Week at Unknown time  . Prenatal Vit-Fe Fumarate-FA (PRENATAL MULTIVITAMIN) TABS tablet Take 1 tablet by mouth daily at 12 noon.   06/25/2015 at Unknown time  . ondansetron (ZOFRAN ODT) 4 MG disintegrating tablet Take 1 tablet (4 mg total) by mouth every 6 (six) hours as needed for nausea. (Patient not taking: Reported on 06/25/2015) 20 tablet 0 Not Taking at Unknown time  . [DISCONTINUED] pantoprazole (PROTONIX) 20 MG tablet TAKE ONE TABLET BY MOUTH ONCE DAILY (Patient not taking: Reported on 06/25/2015) 30 tablet 0 Not Taking at Unknown time    Review of  Systems  Constitutional: Negative for fever and chills.  Eyes: Negative for blurred vision.  Respiratory: Negative for shortness of breath.   Cardiovascular: Negative for chest pain.  Gastrointestinal: Positive for nausea and vomiting. Negative for heartburn and abdominal pain.  Genitourinary: Negative for dysuria.  Musculoskeletal: Negative for myalgias.  Skin: Negative for rash.  Neurological: Negative for dizziness, weakness and headaches.  Endo/Heme/Allergies: Negative for environmental allergies.   Physical Exam   Blood pressure 138/80, pulse 113, temperature 98.3 F (36.8 C), resp. rate 22, height 5' 3.5" (1.613 m), weight  175 lb (79.379 kg), last menstrual period 12/03/2014, unknown if currently breastfeeding.  Physical Exam  Nursing note and vitals reviewed. Constitutional: She is oriented to person, place, and time. She appears well-developed and well-nourished. No distress.  Eyes: No scleral icterus.  Neck: Normal range of motion.  Cardiovascular: Normal rate.   Respiratory: Effort normal.  GI: Soft. There is no tenderness.  gravid  Musculoskeletal: Normal range of motion.  Neurological: She is alert and oriented to person, place, and time.  Skin: Skin is warm and dry. No rash noted.    MAU Course  Procedures  MDM This is a 40 year old G2P0010 at 3315w1d who presented to the MAU with vomiting after running out of her heartburn medicine. No red flags. Her abdominal exam is completely benign. She appears mildly dehydrated with dry mucous membranes and a HR of 100, but she is drinking water without any issues.  Assessment and Plan  # Vomiting: Likely secondary to uncontrolled GERD - Given Protonix x 1 in the MAU - Protonix refill sent to pharmacy. - Encouraged Pt to drink plenty of water over the next few days - Return precautions given - Continue routine prenatal care  Hilton SinclairKaty D Jaimya Feliciano 06/25/2015, 8:28 PM

## 2015-06-25 NOTE — MAU Note (Signed)
Pt states she can't keep anything down. This started last night and has continued today. Pt states she began vomiting at 11AM. Pt tried to eat this afternoon and then took a nap and has been vomiting since waking up. Pt states she was just told in the office that she has GDM but they didn't tell her what to do so she has been eating like normal. Pt states the clinic was supposed to give her a blood sugar meter but she hasn't received it yet. Pt denies abdominal pain, ctxs, LOF, or vaginal bleeding. Pt states decreased fetal movement since her ultrasound appointment this afternoon.

## 2015-06-25 NOTE — Discharge Instructions (Signed)
It was so nice to meet you!  Please make sure you are drinking plenty of water over the next few days to rehydrate you. You should try to drink at least ten 8oz glasses of water per day.  I have sent a refill of the Protonix into your pharmacy.  Please continue regular prenatal care.

## 2015-06-26 ENCOUNTER — Encounter: Payer: Medicaid Other | Admitting: Obstetrics & Gynecology

## 2015-06-30 NOTE — Progress Notes (Signed)
Client see for instruction in blood glucose monitoring, meter instructions and monitoring of blood glucose 4 times per day, goals for blood glucose levels, need for walking to help with lowering blood glucose and signs and symptoms of hypoglycemia and the treatment of low blood glucose.  Provided meter instructions, Maggie Nnenna Meador, RN, RD, LDN

## 2015-07-07 ENCOUNTER — Inpatient Hospital Stay (HOSPITAL_COMMUNITY)
Admission: AD | Admit: 2015-07-07 | Discharge: 2015-07-08 | Disposition: A | Discharge status: Home / Self Care | Payer: Medicaid Other | Source: Ambulatory Visit | Attending: Family Medicine | Admitting: Family Medicine

## 2015-07-07 ENCOUNTER — Encounter: Payer: Self-pay | Admitting: Obstetrics & Gynecology

## 2015-07-07 ENCOUNTER — Ambulatory Visit (INDEPENDENT_AMBULATORY_CARE_PROVIDER_SITE_OTHER): Payer: Medicaid Other | Admitting: Obstetrics & Gynecology

## 2015-07-07 ENCOUNTER — Encounter (HOSPITAL_COMMUNITY): Payer: Self-pay | Admitting: *Deleted

## 2015-07-07 VITALS — BP 121/75 | HR 98 | Temp 98.7°F | Wt 178.3 lb

## 2015-07-07 DIAGNOSIS — O99333 Smoking (tobacco) complicating pregnancy, third trimester: Secondary | ICD-10-CM | POA: Diagnosis not present

## 2015-07-07 DIAGNOSIS — J449 Chronic obstructive pulmonary disease, unspecified: Secondary | ICD-10-CM | POA: Insufficient documentation

## 2015-07-07 DIAGNOSIS — R519 Headache, unspecified: Secondary | ICD-10-CM

## 2015-07-07 DIAGNOSIS — R51 Headache: Secondary | ICD-10-CM | POA: Diagnosis not present

## 2015-07-07 DIAGNOSIS — R739 Hyperglycemia, unspecified: Secondary | ICD-10-CM | POA: Diagnosis not present

## 2015-07-07 DIAGNOSIS — O10919 Unspecified pre-existing hypertension complicating pregnancy, unspecified trimester: Secondary | ICD-10-CM

## 2015-07-07 DIAGNOSIS — O24410 Gestational diabetes mellitus in pregnancy, diet controlled: Secondary | ICD-10-CM

## 2015-07-07 DIAGNOSIS — J45909 Unspecified asthma, uncomplicated: Secondary | ICD-10-CM | POA: Diagnosis not present

## 2015-07-07 DIAGNOSIS — O99513 Diseases of the respiratory system complicating pregnancy, third trimester: Secondary | ICD-10-CM | POA: Insufficient documentation

## 2015-07-07 DIAGNOSIS — Z7982 Long term (current) use of aspirin: Secondary | ICD-10-CM | POA: Insufficient documentation

## 2015-07-07 DIAGNOSIS — Z3A30 30 weeks gestation of pregnancy: Secondary | ICD-10-CM | POA: Insufficient documentation

## 2015-07-07 DIAGNOSIS — O24419 Gestational diabetes mellitus in pregnancy, unspecified control: Secondary | ICD-10-CM | POA: Insufficient documentation

## 2015-07-07 DIAGNOSIS — O10912 Unspecified pre-existing hypertension complicating pregnancy, second trimester: Secondary | ICD-10-CM | POA: Diagnosis not present

## 2015-07-07 DIAGNOSIS — O10013 Pre-existing essential hypertension complicating pregnancy, third trimester: Secondary | ICD-10-CM | POA: Diagnosis not present

## 2015-07-07 DIAGNOSIS — Z880 Allergy status to penicillin: Secondary | ICD-10-CM | POA: Insufficient documentation

## 2015-07-07 DIAGNOSIS — F17210 Nicotine dependence, cigarettes, uncomplicated: Secondary | ICD-10-CM | POA: Diagnosis not present

## 2015-07-07 LAB — URINALYSIS, ROUTINE W REFLEX MICROSCOPIC
BILIRUBIN URINE: NEGATIVE
Glucose, UA: NEGATIVE mg/dL
KETONES UR: NEGATIVE mg/dL
NITRITE: NEGATIVE
PH: 6 (ref 5.0–8.0)
Protein, ur: NEGATIVE mg/dL
SPECIFIC GRAVITY, URINE: 1.015 (ref 1.005–1.030)

## 2015-07-07 LAB — CBC
HCT: 30.1 % — ABNORMAL LOW (ref 36.0–46.0)
HEMOGLOBIN: 10.2 g/dL — AB (ref 12.0–15.0)
MCH: 30.3 pg (ref 26.0–34.0)
MCHC: 33.9 g/dL (ref 30.0–36.0)
MCV: 89.3 fL (ref 78.0–100.0)
PLATELETS: 280 10*3/uL (ref 150–400)
RBC: 3.37 MIL/uL — AB (ref 3.87–5.11)
RDW: 13.2 % (ref 11.5–15.5)
WBC: 9.1 10*3/uL (ref 4.0–10.5)

## 2015-07-07 LAB — POCT URINALYSIS DIP (DEVICE)
Glucose, UA: NEGATIVE mg/dL
Hgb urine dipstick: NEGATIVE
KETONES UR: NEGATIVE mg/dL
Nitrite: NEGATIVE
Protein, ur: 30 mg/dL — AB
Specific Gravity, Urine: 1.025 (ref 1.005–1.030)
UROBILINOGEN UA: 1 mg/dL (ref 0.0–1.0)
pH: 6 (ref 5.0–8.0)

## 2015-07-07 LAB — COMPREHENSIVE METABOLIC PANEL
ALK PHOS: 122 U/L (ref 38–126)
ALT: 17 U/L (ref 14–54)
AST: 24 U/L (ref 15–41)
Albumin: 2.7 g/dL — ABNORMAL LOW (ref 3.5–5.0)
Anion gap: 7 (ref 5–15)
BUN: 7 mg/dL (ref 6–20)
CALCIUM: 9 mg/dL (ref 8.9–10.3)
CHLORIDE: 107 mmol/L (ref 101–111)
CO2: 21 mmol/L — AB (ref 22–32)
CREATININE: 0.53 mg/dL (ref 0.44–1.00)
GFR calc non Af Amer: >60 mL/min (ref >60)
GLUCOSE: 127 mg/dL — AB (ref 65–99)
Potassium: 3.6 mmol/L (ref 3.5–5.1)
SODIUM: 135 mmol/L (ref 135–145)
Total Bilirubin: 0.8 mg/dL (ref 0.3–1.2)
Total Protein: 5.8 g/dL — ABNORMAL LOW (ref 6.5–8.1)

## 2015-07-07 LAB — URINE MICROSCOPIC-ADD ON

## 2015-07-07 LAB — GLUCOSE, CAPILLARY: Glucose-Capillary: 127 mg/dL — ABNORMAL HIGH (ref 65–99)

## 2015-07-07 MED ORDER — ACCU-CHEK FASTCLIX LANCETS MISC: 1.0000 | Freq: Four times a day (QID) | Status: DC

## 2015-07-07 MED ORDER — ACCU-CHEK NANO SMARTVIEW W/DEVICE KIT: 1.0000 | PACK | Freq: Once | Status: DC

## 2015-07-07 MED ORDER — GLUCOSE BLOOD VI STRP: ORAL_STRIP | Status: DC

## 2015-07-07 MED ORDER — BUTALBITAL-APAP-CAFFEINE 50-325-40 MG PO TABS
2.0000 | ORAL_TABLET | Freq: Once | ORAL | Status: AC
  Administered 2015-07-07: 2 via ORAL
  Filled 2015-07-07: qty 2

## 2015-07-07 MED ORDER — ASPIRIN-ACETAMINOPHEN-CAFFEINE 250-250-65 MG PO TABS
2.0000 | ORAL_TABLET | Freq: Once | ORAL | Status: DC
  Filled 2015-07-07: qty 2

## 2015-07-07 NOTE — Progress Notes (Signed)
Pt stated she was told she would get test strips to test her blood sugars although never received them.

## 2015-07-07 NOTE — Patient Instructions (Signed)
Gestational Diabetes Mellitus  Gestational diabetes mellitus, often simply referred to as gestational diabetes, is a type of diabetes that some women develop during pregnancy. In gestational diabetes, the pancreas does not make enough insulin (a hormone), the cells are less responsive to the insulin that is made (insulin resistance), or both. Normally, insulin moves sugars from food into the tissue cells. The tissue cells use the sugars for energy. The lack of insulin or the lack of normal response to insulin causes excess sugars to build up in the blood instead of going into the tissue cells. As a result, high blood sugar (hyperglycemia) develops. The effect of high sugar (glucose) levels can cause many problems.   RISK FACTORS  You have an increased chance of developing gestational diabetes if you have a family history of diabetes and also have one or more of the following risk factors:  · A body mass index over 30 (obesity).  · A previous pregnancy with gestational diabetes.  · An older age at the time of pregnancy.  If blood glucose levels are kept in the normal range during pregnancy, women can have a healthy pregnancy. If your blood glucose levels are not well controlled, there may be risks to you, your unborn baby (fetus), your labor and delivery, or your newborn baby.   SYMPTOMS   If symptoms are experienced, they are much like symptoms you would normally expect during pregnancy. The symptoms of gestational diabetes include:   · Increased thirst (polydipsia).  · Increased urination (polyuria).  · Increased urination during the night (nocturia).  · Weight loss. This weight loss may be rapid.  · Frequent, recurring infections.  · Tiredness (fatigue).  · Weakness.  · Vision changes, such as blurred vision.  · Fruity smell to your breath.  · Abdominal pain.  DIAGNOSIS  Diabetes is diagnosed when blood glucose levels are increased. Your blood glucose level may be checked by one or more of the following blood  tests:  · A fasting blood glucose test. You will not be allowed to eat for at least 8 hours before a blood sample is taken.  · A random blood glucose test. Your blood glucose is checked at any time of the day regardless of when you ate.  · An oral glucose tolerance test (OGTT). Your blood glucose is measured after you have not eaten (fasted) for 1-3 hours and then after you drink a glucose-containing beverage. Since the hormones that cause insulin resistance are highest at about 24-28 weeks of a pregnancy, an OGTT is usually performed during that time. If you have risk factors, you may be screened for undiagnosed type 2 diabetes at your first prenatal visit.  TREATMENT   Gestational diabetes should be managed first with diet and exercise. Medicines may be added only if they are needed.  · You will need to take diabetes medicine or insulin daily to keep blood glucose levels in the desired range.  · You will need to match insulin dosing with exercise and healthy food choices.  If you have gestational diabetes, your treatment goal is to maintain the following blood glucose levels:  · Before meals (preprandial): at or below 95 mg/dL.  · After meals (postprandial):    One hour after a meal: at or below 140 mg/dL.    Two hours after a meal: at or below 120 mg/dL.  If you have pre-existing type 1 or type 2 diabetes, your treatment goal is to maintain the following blood glucose levels:  · Before   meals, at bedtime, and overnight: 60-99 mg/dL.  · After meals: peak of 100-129 mg/dL.  HOME CARE INSTRUCTIONS   · Have your hemoglobin A1c level checked twice a year.  · Perform daily blood glucose monitoring as directed by your health care provider. It is common to perform frequent blood glucose monitoring.  · Monitor urine ketones when you are ill and as directed by your health care provider.  · Take your diabetes medicine and insulin as directed by your health care provider to maintain your blood glucose level in the desired  range.  ¨ Never run out of diabetes medicine or insulin. It is needed every day.  ¨ Adjust insulin based on your intake of carbohydrates. Carbohydrates can raise blood glucose levels but need to be included in your diet. Carbohydrates provide vitamins, minerals, and fiber which are an essential part of a healthy diet. Carbohydrates are found in fruits, vegetables, whole grains, dairy products, legumes, and foods containing added sugars.  · Eat healthy foods. Alternate 3 meals with 3 snacks.  · Maintain a healthy weight gain. The usual total expected weight gain varies according to your prepregnancy body mass index (BMI).  · Carry a medical alert card or wear your medical alert jewelry.  · Carry a 15-gram carbohydrate snack with you at all times to treat low blood glucose (hypoglycemia). Some examples of 15-gram carbohydrate snacks include:  ¨ Glucose tablets, 3 or 4.  ¨ Glucose gel, 15-gram tube.  ¨ Raisins, 2 tablespoons (24 g).  ¨ Jelly beans, 6.  ¨ Animal crackers, 8.  ¨ Fruit juice, regular soda, or low-fat milk, 4 ounces (120 mL).  ¨ Gummy treats, 9.  · Recognize hypoglycemia. Hypoglycemia during pregnancy occurs with blood glucose levels of 60 mg/dL and below. The risk for hypoglycemia increases when fasting or skipping meals, during or after intense exercise, and during sleep. Hypoglycemia symptoms can include:  ¨ Tremors or shakes.  ¨ Decreased ability to concentrate.  ¨ Sweating.  ¨ Increased heart rate.  ¨ Headache.  ¨ Dry mouth.  ¨ Hunger.  ¨ Irritability.  ¨ Anxiety.  ¨ Restless sleep.  ¨ Altered speech or coordination.  ¨ Confusion.  · Treat hypoglycemia promptly. If you are alert and able to safely swallow, follow the 15:15 rule:  ¨ Take 15-20 grams of rapid-acting glucose or carbohydrate. Rapid-acting options include glucose gel, glucose tablets, or 4 ounces (120 mL) of fruit juice, regular soda, or low-fat milk.  ¨ Check your blood glucose level 15 minutes after taking the glucose.  ¨ Take 15-20  grams more of glucose if the repeat blood glucose level is still 70 mg/dL or below.  ¨ Eat a meal or snack within 1 hour once blood glucose levels return to normal.  · Be alert to polyuria (excess urination) and polydipsia (excess thirst) which are early signs of hyperglycemia. An early awareness of hyperglycemia allows for prompt treatment. Treat hyperglycemia as directed by your health care provider.  · Engage in at least 30 minutes of physical activity a day or as directed by your health care provider. Ten minutes of physical activity timed 30 minutes after each meal is encouraged to control postprandial blood glucose levels.  · Adjust your insulin dosing and food intake as needed if you start a new exercise or sport.  · Follow your sick-day plan at any time you are unable to eat or drink as usual.  · Avoid tobacco and alcohol use.  · Keep all follow-up visits as directed   by your health care provider.  · Follow the advice of your health care provider regarding your prenatal and post-delivery (postpartum) appointments, meal planning, exercise, medicines, vitamins, blood tests, other medical tests, and physical activities.  · Perform daily skin and foot care. Examine your skin and feet daily for cuts, bruises, redness, nail problems, bleeding, blisters, or sores.  · Brush your teeth and gums at least twice a day and floss at least once a day. Follow up with your dentist regularly.  · Schedule an eye exam during the first trimester of your pregnancy or as directed by your health care provider.  · Share your diabetes management plan with your workplace or school.  · Stay up-to-date with immunizations.  · Learn to manage stress.  · Obtain ongoing diabetes education and support as needed.  · Learn about and consider breastfeeding your baby.  · You should have your blood sugar level checked 6-12 weeks after delivery. This is done with an oral glucose tolerance test (OGTT).  SEEK MEDICAL CARE IF:   · You are unable to  eat food or drink fluids for more than 6 hours.  · You have nausea and vomiting for more than 6 hours.  · You have a blood glucose level of 200 mg/dL and you have ketones in your urine.  · There is a change in mental status.  · You develop vision problems.  · You have a persistent headache.  · You have upper abdominal pain or discomfort.  · You develop an additional serious illness.  · You have diarrhea for more than 6 hours.  · You have been sick or have had a fever for a couple of days and are not getting better.  SEEK IMMEDIATE MEDICAL CARE IF:   · You have difficulty breathing.  · You no longer feel the baby moving.  · You are bleeding or have discharge from your vagina.  · You start having premature contractions or labor.  MAKE SURE YOU:  · Understand these instructions.  · Will watch your condition.  · Will get help right away if you are not doing well or get worse.     This information is not intended to replace advice given to you by your health care provider. Make sure you discuss any questions you have with your health care provider.     Document Released: 05/17/2000 Document Revised: 03/01/2014 Document Reviewed: 09/07/2011  Elsevier Interactive Patient Education ©2016 [employer]

## 2015-07-07 NOTE — MAU Note (Signed)
Pt states that she took her blood sugar at 7pm and it was 219. States that she last at 12pm. States that she also has a HA that started around the same time. Took 1 baby aspirin. Denies contractions, vag bleeding or LOF. +FM

## 2015-07-07 NOTE — Progress Notes (Signed)
Subjective:  Christine Mueller is a 40 y.o. G2P0010 at 7577w6d being seen today for ongoing prenatal care.  She is currently monitored for the following issues for this high-risk pregnancy and has Chronic hypertension during pregnancy, antepartum; Advanced maternal age in multigravida; COPD (chronic obstructive pulmonary disease) (HCC); Asthma; Smoker; Supervision of high risk pregnancy, antepartum; Gestational diabetes mellitus (GDM), antepartum; and Rh negative status during pregnancy on her problem list.  Patient reports no complaints.  Contractions: Irritability. Vag. Bleeding: None.  Movement: Present. Denies leaking of fluid.   The following portions of the patient's history were reviewed and updated as appropriate: allergies, current medications, past family history, past medical history, past social history, past surgical history and problem list. Problem list updated.  Objective:   Filed Vitals:   07/07/15 0805  BP: 121/75  Pulse: 98  Temp: 98.7 F (37.1 C)  Weight: 178 lb 4.8 oz (80.876 kg)    Fetal Status: Fetal Heart Rate (bpm): 160   Movement: Present     General:  Alert, oriented and cooperative. Patient is in no acute distress.  Skin: Skin is warm and dry. No rash noted.   Cardiovascular: Normal heart rate noted  Respiratory: Normal respiratory effort, no problems with respiration noted  Abdomen: Soft, gravid, appropriate for gestational age. Pain/Pressure: Present     Pelvic: Vag. Bleeding: None     Cervical exam deferred        Extremities: Normal range of motion.  Edema: Mild pitting, slight indentation  Mental Status: Normal mood and affect. Normal behavior. Normal judgment and thought content.   Urinalysis:      Assessment and Plan:  Pregnancy: G2P0010 at 7977w6d GDM, needs testing supplies,  No values to review Preterm labor symptoms and general obstetric precautions including but not limited to vaginal bleeding, contractions, leaking of fluid and fetal movement  were reviewed in detail with the patient. Please refer to After Visit Summary for other counseling recommendations.  Return in about 1 week (around 07/14/2015).   Adam PhenixJames G Liset Mcmonigle, MD

## 2015-07-07 NOTE — MAU Provider Note (Signed)
MAU HISTORY AND PHYSICAL  Chief Complaint:  Hyperglycemia and Headache   Daiana Vitiello Day is a 40 y.o.  G2P0010 with IUP at 22w6dpresenting for Hyperglycemia and Headache  Dx w/ GDM 2  Weeks ago, says glucometer has not yet been prescribed. Checked blood sugar at grandmother's this morning and 219. No abdominal pain, nausea, or vomiting. No dysuria or increased frequency. No bleeding, contractions, or LOF.  Also w/ headache. Has had ha previously this pregnancy but this one stronger. Whole head. No weakness or numbness.  Past Medical History  Diagnosis Date  . Hypertension   . COPD (chronic obstructive pulmonary disease) ([facility]   . Asthma   . Gestational diabetes     Past Surgical History  Procedure Laterality Date  . Wisdom teeth removal      Family History  Problem Relation Age of Onset  . Diabetes Mother   . Hypertension Mother     Social History  Substance Use Topics  . Smoking status: Current Every Day Smoker -- 1.00 packs/day for 15 years    Types: Cigarettes  . Smokeless tobacco: Never Used     Comment: Discussed need to quit for baby, asthma and COPD. Information given  . Alcohol Use: No    Allergies  Allergen Reactions  . Penicillins Swelling    Has patient had a PCN reaction causing immediate rash, facial/tongue/throat swelling, SOB or lightheadedness with hypotension: Yes Has patient had a PCN reaction causing severe rash involving mucus membranes or skin necrosis: No Has patient had a PCN reaction that required hospitalization Yes Has patient had a PCN reaction occurring within the last 10 years: No If all of the above answers are "NO", then may proceed with Cephalosporin use.     Prescriptions prior to admission  Medication Sig Dispense Refill Last Dose  . ACCU-CHEK FASTCLIX LANCETS MISC 1 Device by Percutaneous route 4 (four) times daily. 100 each 12   . albuterol (PROVENTIL HFA;VENTOLIN HFA) 108 (90 Base) MCG/ACT inhaler Inhale 2 puffs into the  lungs every 6 (six) hours as needed for wheezing. 1 Inhaler 1 Taking  . aspirin 81 MG chewable tablet Chew 1 tablet (81 mg total) by mouth daily. 30 tablet 2 Taking  . Blood Glucose Monitoring Suppl (ACCU-CHEK NANO SMARTVIEW) w/Device KIT 1 Device by Does not apply route once. 1 kit 0   . glucose blood test strip Use as instructed 100 each 12   . labetalol (NORMODYNE) 100 MG tablet Take 1 tablet (100 mg total) by mouth 2 (two) times daily. 60 tablet 2 Taking  . loratadine (CLARITIN) 10 MG tablet Take 1 tablet (10 mg total) by mouth daily as needed for allergies. 20 tablet 1 Taking  . nicotine (NICODERM CQ - DOSED IN MG/24 HOURS) 21 mg/24hr patch Place 21 mg onto the skin daily.   Taking  . ondansetron (ZOFRAN ODT) 4 MG disintegrating tablet Take 1 tablet (4 mg total) by mouth every 6 (six) hours as needed for nausea. (Patient not taking: Reported on 06/25/2015) 20 tablet 0 Not Taking  . pantoprazole (PROTONIX) 20 MG tablet Take 20 mg by mouth daily.   Taking  . pantoprazole (PROTONIX) 20 MG tablet Take 1 tablet (20 mg total) by mouth daily. 30 tablet 2 Taking  . Prenatal Vit-Fe Fumarate-FA (PRENATAL MULTIVITAMIN) TABS tablet Take 1 tablet by mouth daily at 12 noon.   Taking    Review of Systems - Negative except for what is mentioned in HPI.  Physical Exam  Blood  pressure 128/77, pulse 100, temperature 98.6 F (37 C), temperature source Oral, resp. rate 20, height _0  (1.626 m), weight 181 lb (82.101 kg), last menstrual period 12/03/2014, unknown if currently breastfeeding. GENERAL: Well-developed, well-nourished female in no acute distress.  LUNGS: Clear to auscultation bilaterally.  HEART: Regular rate and rhythm. ABDOMEN: Soft, nontender, nondistended, gravid.  EXTREMITIES: Nontender, no edema, 2+ distal pulses. Neuro: cn 2-12 grossly intact, 5/5 upper and lower strength, normal speech FHT:  145/mod/+a/-d Contractions: none   Labs: Results for orders placed or performed during the  hospital encounter of 07/07/15 (from the past 24 hour(s))  Urinalysis, Routine w reflex microscopic (not at [facility])   Collection Time: 07/07/15 10:50 PM  Result Value Ref Range   Color, Urine YELLOW YELLOW   APPearance CLEAR CLEAR   Specific Gravity, Urine 1.015 1.005 - 1.030   pH 6.0 5.0 - 8.0   Glucose, UA NEGATIVE NEGATIVE mg/dL   Hgb urine dipstick TRACE (A) NEGATIVE   Bilirubin Urine NEGATIVE NEGATIVE   Ketones, ur NEGATIVE NEGATIVE mg/dL   Protein, ur NEGATIVE NEGATIVE mg/dL   Nitrite NEGATIVE NEGATIVE   Leukocytes, UA SMALL (A) NEGATIVE  Urine microscopic-add on   Collection Time: 07/07/15 10:50 PM  Result Value Ref Range   Squamous Epithelial / LPF 0-5 (A) NONE SEEN   WBC, UA 0-5 0 - 5 WBC/hpf   RBC / HPF 0-5 0 - 5 RBC/hpf   Bacteria, UA FEW (A) NONE SEEN  Glucose, capillary   Collection Time: 07/07/15 11:03 PM  Result Value Ref Range   Glucose-Capillary 127 (H) 65 - 99 mg/dL  CBC   Collection Time: 07/07/15 11:18 PM  Result Value Ref Range   WBC 9.1 4.0 - 10.5 K/uL   RBC 3.37 (L) 3.87 - 5.11 MIL/uL   Hemoglobin 10.2 (L) 12.0 - 15.0 g/dL   HCT 30.1 (L) 36.0 - 46.0 %   MCV 89.3 78.0 - 100.0 fL   MCH 30.3 26.0 - 34.0 pg   MCHC 33.9 30.0 - 36.0 g/dL   RDW 13.2 11.5 - 15.5 %   Platelets 280 150 - 400 K/uL  Comprehensive metabolic panel   Collection Time: 07/07/15 11:18 PM  Result Value Ref Range   Sodium 135 135 - 145 mmol/L   Potassium 3.6 3.5 - 5.1 mmol/L   Chloride 107 101 - 111 mmol/L   CO2 21 (L) 22 - 32 mmol/L   Glucose, Bld 127 (H) 65 - 99 mg/dL   BUN 7 6 - 20 mg/dL   Creatinine, Ser 0.53 0.44 - 1.00 mg/dL   Calcium 9.0 8.9 - 10.3 mg/dL   Total Protein 5.8 (L) 6.5 - 8.1 g/dL   Albumin 2.7 (L) 3.5 - 5.0 g/dL   AST 24 15 - 41 U/L   ALT 17 14 - 54 U/L   Alkaline Phosphatase 122 38 - 126 U/L   Total Bilirubin 0.8 0.3 - 1.2 mg/dL   GFR calc non Af Amer >60 >60 mL/min   GFR calc Af Amer >60 >60 mL/min   Anion gap 7 5 - 15  Results for orders placed or  performed in visit on 07/07/15 (from the past 24 hour(s))  POCT urinalysis dip (device)   Collection Time: 07/07/15  8:15 AM  Result Value Ref Range   Glucose, UA NEGATIVE NEGATIVE mg/dL   Bilirubin Urine SMALL (A) NEGATIVE   Ketones, ur NEGATIVE NEGATIVE mg/dL   Specific Gravity, Urine 1.025 1.005 - 1.030   Hgb urine dipstick NEGATIVE  NEGATIVE   pH 6.0 5.0 - 8.0   Protein, ur 30 (A) NEGATIVE mg/dL   Urobilinogen, UA 1.0 0.0 - 1.0 mg/dL   Nitrite NEGATIVE NEGATIVE   Leukocytes, UA TRACE (A) NEGATIVE    Imaging Studies:  [place] [facility] Ob Follow Up  06/25/2015  OBSTETRICAL ULTRASOUND: This exam was performed within a [facility]trasound Department. The OB US report was generated in the AS system, and faxed to the ordering physician.  This report is available in the [facility]. See the [facility] Obstetric US report via the Image Link.   Assessment: IVET GUERRIERI is  40 y.o. G2P0010 at 69w6dpresents with Hyperglycemia and Headache Here glucose 120s, no signs of uti, no signs of dka on bmp. HA mild, normal neuro exam, normal bp resolved with fioricet. NST reactive.  Plan: - i'm ordering patient's glucometer and supplies, instructions to pick up tomorrow, and if not available to call clinic from the pharmacy so that can get supplies. Discussed when to check glucose. F/u clinic next week w/ glucose log - headache and dka return precautions  NPatsy LagerWouk 5/16/201712:08 AM

## 2015-07-08 ENCOUNTER — Other Ambulatory Visit: Payer: Self-pay | Admitting: General Practice

## 2015-07-08 DIAGNOSIS — R739 Hyperglycemia, unspecified: Secondary | ICD-10-CM

## 2015-07-08 DIAGNOSIS — O24410 Gestational diabetes mellitus in pregnancy, diet controlled: Secondary | ICD-10-CM | POA: Diagnosis not present

## 2015-07-08 MED ORDER — GLUCOSE BLOOD VI STRP: ORAL_STRIP | Status: DC

## 2015-07-08 MED ORDER — ACCU-CHEK NANO SMARTVIEW W/DEVICE KIT: 1.0000 | PACK | Freq: Once | Status: DC

## 2015-07-08 MED ORDER — ACCU-CHEK FASTCLIX LANCETS MISC: 1.0000 | Freq: Four times a day (QID) | Status: DC

## 2015-07-08 NOTE — Discharge Instructions (Signed)
Blood Glucose Monitoring, Adult ° °Monitoring your blood glucose (also know as blood sugar) helps you to manage your diabetes. It also helps you and your health care provider monitor your diabetes and determine how well your treatment plan is working. °WHY SHOULD YOU MONITOR YOUR BLOOD GLUCOSE? °· It can help you understand how food, exercise, and medicine affect your blood glucose. °· It allows you to know what your blood glucose is at any given moment. You can quickly tell if you are having low blood glucose (hypoglycemia) or high blood glucose (hyperglycemia). °· It can help you and your health care provider know how to adjust your medicines. °· It can help you understand how to manage an illness or adjust medicine for exercise. °WHEN SHOULD YOU TEST? °Your health care provider will help you decide how often you should check your blood glucose. This may depend on the type of diabetes you have, your diabetes control, or the types of medicines you are taking. Be sure to write down all of your blood glucose readings so that this information can be reviewed with your health care provider. See below for examples of testing times that your health care provider may suggest. °Type 1 Diabetes °· Test at least 2 times per day if your diabetes is well controlled, if you are using an insulin pump, or if you perform multiple daily injections. °· If your diabetes is not well controlled or if you are sick, you may need to test more often. °· It is a good idea to also test: °¨ Before every insulin injection. °¨ Before and after exercise. °¨ Between meals and 2 hours after a meal. °¨ Occasionally between 2:00 a.m. and 3:00 a.m. °Type 2 Diabetes °· If you are taking insulin, test at least 2 times per day. However, it is best to test before every insulin injection. °· If you take medicines by mouth (orally), test 2 times a day. °· If you are on a controlled diet, test once a day. °· If your diabetes is not well controlled or if you  are sick, you may need to monitor more often. °HOW TO MONITOR YOUR BLOOD GLUCOSE °Supplies Needed °· Blood glucose meter. °· Test strips for your meter. Each meter has its own strips. You must use the strips that go with your own meter. °· A pricking needle (lancet). °· A device that holds the lancet (lancing device). °· A journal or log book to write down your results. °Procedure °· Wash your hands with soap and water. Alcohol is not preferred. °· Prick the side of your finger (not the tip) with the lancet. °· Gently milk the finger until a small drop of blood appears. °· Follow the instructions that come with your meter for inserting the test strip, applying blood to the strip, and using your blood glucose meter. °Other Areas to Get Blood for Testing °Some meters allow you to use other areas of your body (other than your finger) to test your blood. These areas are called alternative sites. The most common alternative sites are: °· The forearm. °· The thigh. °· The back area of the lower leg. °· The palm of the hand. °The blood flow in these areas is slower. Therefore, the blood glucose values you get may be delayed, and the numbers are different from what you would get from your fingers. Do not use alternative sites if you think you are having hypoglycemia. Your reading will not be accurate. Always use a finger if you   are having hypoglycemia. Also, if you cannot feel your lows (hypoglycemia unawareness), always use your fingers for your blood glucose checks. °ADDITIONAL TIPS FOR GLUCOSE MONITORING °· Do not reuse lancets. °· Always carry your supplies with you. °· All blood glucose meters have a 24-hour "hotline" number to call if you have questions or need help. °· Adjust (calibrate) your blood glucose meter with a control solution after finishing a few boxes of strips. °BLOOD GLUCOSE RECORD KEEPING °It is a good idea to keep a daily record or log of your blood glucose readings. Most glucose meters, if not all,  keep your glucose records stored in the meter. Some meters come with the ability to download your records to your home computer. Keeping a record of your blood glucose readings is especially helpful if you are wanting to look for patterns. Make notes to go along with the blood glucose readings because you might forget what happened at that exact time. Keeping good records helps you and your health care provider to work together to achieve good diabetes management.  °  °This information is not intended to replace advice given to you by your health care provider. Make sure you discuss any questions you have with your health care provider. °  °Document Released: 02/11/2003 Document Revised: 03/01/2014 Document Reviewed: 07/03/2012 °Elsevier Interactive Patient Education ©2016 Elsevier Inc. ° °

## 2015-07-14 ENCOUNTER — Encounter: Payer: Self-pay | Admitting: Obstetrics & Gynecology

## 2015-07-14 ENCOUNTER — Ambulatory Visit (INDEPENDENT_AMBULATORY_CARE_PROVIDER_SITE_OTHER): Payer: Medicaid Other | Admitting: Obstetrics & Gynecology

## 2015-07-14 VITALS — BP 143/72 | HR 100 | Wt 175.0 lb

## 2015-07-14 DIAGNOSIS — O24419 Gestational diabetes mellitus in pregnancy, unspecified control: Secondary | ICD-10-CM | POA: Diagnosis not present

## 2015-07-14 DIAGNOSIS — O9989 Other specified diseases and conditions complicating pregnancy, childbirth and the puerperium: Secondary | ICD-10-CM

## 2015-07-14 DIAGNOSIS — O99333 Smoking (tobacco) complicating pregnancy, third trimester: Secondary | ICD-10-CM | POA: Diagnosis not present

## 2015-07-14 DIAGNOSIS — O10919 Unspecified pre-existing hypertension complicating pregnancy, unspecified trimester: Secondary | ICD-10-CM

## 2015-07-14 DIAGNOSIS — O10911 Unspecified pre-existing hypertension complicating pregnancy, first trimester: Secondary | ICD-10-CM

## 2015-07-14 DIAGNOSIS — R109 Unspecified abdominal pain: Secondary | ICD-10-CM | POA: Diagnosis not present

## 2015-07-14 DIAGNOSIS — R0981 Nasal congestion: Secondary | ICD-10-CM | POA: Diagnosis not present

## 2015-07-14 DIAGNOSIS — R51 Headache: Secondary | ICD-10-CM

## 2015-07-14 DIAGNOSIS — O10913 Unspecified pre-existing hypertension complicating pregnancy, third trimester: Secondary | ICD-10-CM | POA: Diagnosis not present

## 2015-07-14 DIAGNOSIS — O26891 Other specified pregnancy related conditions, first trimester: Secondary | ICD-10-CM

## 2015-07-14 DIAGNOSIS — J45909 Unspecified asthma, uncomplicated: Secondary | ICD-10-CM | POA: Diagnosis not present

## 2015-07-14 DIAGNOSIS — O0993 Supervision of high risk pregnancy, unspecified, third trimester: Secondary | ICD-10-CM

## 2015-07-14 DIAGNOSIS — J449 Chronic obstructive pulmonary disease, unspecified: Secondary | ICD-10-CM

## 2015-07-14 DIAGNOSIS — F17200 Nicotine dependence, unspecified, uncomplicated: Secondary | ICD-10-CM

## 2015-07-14 DIAGNOSIS — O26899 Other specified pregnancy related conditions, unspecified trimester: Secondary | ICD-10-CM

## 2015-07-14 DIAGNOSIS — R519 Headache, unspecified: Secondary | ICD-10-CM

## 2015-07-14 LAB — POCT URINALYSIS DIP (DEVICE)
BILIRUBIN URINE: NEGATIVE
GLUCOSE, UA: NEGATIVE mg/dL
HGB URINE DIPSTICK: NEGATIVE
KETONES UR: NEGATIVE mg/dL
Nitrite: NEGATIVE
Protein, ur: NEGATIVE mg/dL
SPECIFIC GRAVITY, URINE: 1.015 (ref 1.005–1.030)
Urobilinogen, UA: 1 mg/dL (ref 0.0–1.0)
pH: 7 (ref 5.0–8.0)

## 2015-07-14 MED ORDER — LORATADINE 10 MG PO TABS: 10.0000 mg | ORAL_TABLET | Freq: Every day | ORAL | Status: DC | PRN

## 2015-07-14 MED ORDER — LABETALOL HCL 100 MG PO TABS: 100.0000 mg | ORAL_TABLET | Freq: Two times a day (BID) | ORAL | Status: DC

## 2015-07-14 MED ORDER — ALBUTEROL SULFATE HFA 108 (90 BASE) MCG/ACT IN AERS: 2.0000 | INHALATION_SPRAY | Freq: Four times a day (QID) | RESPIRATORY_TRACT | Status: DC | PRN

## 2015-07-14 MED ORDER — PANTOPRAZOLE SODIUM 20 MG PO TBEC: 20.0000 mg | DELAYED_RELEASE_TABLET | Freq: Every day | ORAL | Status: DC

## 2015-07-14 MED ORDER — ACCU-CHEK AVIVA PLUS W/DEVICE KIT: 1.0000 | PACK | Freq: Once | Status: DC

## 2015-07-14 NOTE — Patient Instructions (Signed)
How to Use Compression Stockings--[facility] [employer] Compression stockings are elastic socks that squeeze the legs. They help to increase blood flow to the legs, decrease swelling in the legs, and reduce the chance of developing blood clots in the lower legs. Compression stockings are often used by people who:  Are recovering from surgery.  Have poor circulation in their legs.  Are prone to getting blood clots in their legs.  Have varicose veins.  Sit or stay in bed for long periods of time. HOW TO USE COMPRESSION STOCKINGS Before you put on your compression stockings:  Make sure that they are the correct size. If you do not know your size, ask your health care provider.  Make sure that they are clean, dry, and in good condition.  Check them for rips and tears. Do not put them on if they are ripped or torn. Put your stockings on first thing in the morning, before you get out of bed. Keep them on for as long as your health care provider advises. When you are wearing your stockings:  Keep them as smooth as possible. Do not allow them to bunch up. It is especially important to prevent the stockings from bunching up around your toes or behind your knees.  Do not roll the stockings downward and leave them rolled down. This can decrease blood flow to your leg.  Change them right away if they become wet or dirty. When you take off your stockings, inspect your legs and feet. Anything that does not seem normal may require medical attention. Look for:  Open sores.  Red spots.  Swelling. INFORMATION AND TIPS  Do not stop wearing your compression stockings without talking to your health care provider first.  Wash your stockings everyday with mild detergent in cold or warm water. Do not use bleach. Air-dry your stockings or dry them in a clothes dryer on low heat.  Replace your stockings every 3-6 months.  If skin moisturizing is part of your treatment plan, apply lotion or cream at  night so that your skin will be dry when you put on the stockings in the morning. It is harder to put the stockings on when you have lotion on your legs or feet. SEEK MEDICAL CARE IF: Remove your stockings and seek medical care if:  You have a feeling of pins and needles in your feet or legs.  You have any new changes in your skin.  You have skin lesions that are getting worse.  You have swelling or pain that is getting worse. SEEK IMMEDIATE MEDICAL CARE IF:  You have numbness or tingling in your lower legs that does not get better immediately after you take the stockings off.  Your toes or feet become cold and blue.  You develop open sores or red spots on your legs that do not go away.  You see or feel a warm spot on your leg.  You have new swelling or soreness in your leg.  You are short of breath or you have chest pain for no reason.  You have a rapid or irregular heartbeat.  You feel light-headed or dizzy.   This information is not intended to replace advice given to you by your health care provider. Make sure you discuss any questions you have with your health care provider.   Document Released: 12/06/2008 Document Revised: 06/25/2014 Document Reviewed: 01/16/2014 Elsevier Interactive Patient Education [employer]2016 Elsevier Inc.

## 2015-07-14 NOTE — Progress Notes (Signed)
Subjective:  Christine Mueller is a 40 y.o. G2P0010 at [redacted]w[redacted]d being seen today for ongoing prenatal care.  She is currently monitored for the following issues for this high-risk pregnancy and has Chronic hypertension during pregnancy, antepartum; Advanced maternal age in multigravida; COPD (chronic obstructive pulmonary disease) (HCC); Asthma; Smoker; Supervision of high risk pregnancy, antepartum; Gestational diabetes mellitus (GDM), antepartum; and Rh negative status during pregnancy on her problem list.  Patient reports feet swelling.  Contractions: Irritability. Vag. Bleeding: None.  Movement: Present. Denies leaking of fluid.   The following portions of the patient's history were reviewed and updated as appropriate: allergies, current medications, past family history, past medical history, past social history, past surgical history and problem list. Problem list updated.  Objective:   Filed Vitals:   07/14/15 0808  BP: 143/72  Pulse: 100  Weight: 175 lb (79.379 kg)    Fetal Status: Fetal Heart Rate (bpm): 165 Fundal Height: 34 cm Movement: Present     General:  Alert, oriented and cooperative. Patient is in no acute distress.  Skin: Skin is warm and dry. No rash noted.   Cardiovascular: Normal heart rate noted  Respiratory: Normal respiratory effort, no problems with respiration noted  Abdomen: Soft, gravid, appropriate for gestational age. Pain/Pressure: Present     Pelvic: Vag. Bleeding: None     Cervical exam deferred        Extremities: Normal range of motion.  Edema: Trace  Mental Status: Normal mood and affect. Normal behavior. Normal judgment and thought content.   Urinalysis: Urine Protein: Negative Urine Glucose: Negative  Assessment and Plan:  Pregnancy: G2P0010 at [redacted]w[redacted]d  1. Gestational diabetes mellitus (GDM) affecting pregnancy--WORSENING DUE TO NONCOMPLIANCE -Pt has not been checking CBGs due to not having meter.  Coupon for free meter given and pt will call TODAY  if she has problem with getting meter.  She uses my chart. - Blood Glucose Monitoring Suppl (ACCU-CHEK AVIVA PLUS) w/Device KIT; 1 Device by Does not apply route once.  Dispense: 1 kit; Refill: 0 -twice weekly testing to start this week -[facility] US scheduled for June 1st per patient. -RN to call tomorrow morning to check and see if she got her meter and prescriptions.  Diabetic educator to call Thursday.  Notes will be made in epic regarding communication with patient.  2. Hypertension in pregnancy, pre-existing, antepartum, first trimester - stable today.  No signs of preeclampsia - labetalol (NORMODYNE) 100 MG tablet; Take 1 tablet (100 mg total) by mouth 2 (two) times daily.  Dispense: 60 tablet; Refill: 2  3. Nasal congestion - loratadine (CLARITIN) 10 MG tablet; Take 1 tablet (10 mg total) by mouth daily as needed for allergies.  Dispense: 30 tablet; Refill: 6  6. Asthma, unspecified asthma severity, uncomplicated -COPD diagnosis in ED?  Needs primary care referral.  If using inhaler too ofter, will add Qvar - albuterol (PROVENTIL HFA;VENTOLIN HFA) 108 (90 Base) MCG/ACT inhaler; Inhale 2 puffs into the lungs every 6 (six) hours as needed for wheezing.  Dispense: 1 Inhaler; Refill: 6  7. Smoker -unable to quit.  Pt encouraged to stop.    Preterm labor symptoms and general obstetric precautions including but not limited to vaginal bleeding, contractions, leaking of fluid and fetal movement were reviewed in detail with the patient. Please refer to After Visit Summary for other counseling recommendations.  Return in about 1 day (around 07/15/2015).    H , MD   

## 2015-07-14 NOTE — Progress Notes (Signed)
Patient needs refills on albuterol inhaler, labetalol, claritin, & protonix. Patient reports difficulty getting insurance to cover diabetes supplies- will call [facility] Patient states she has not taken labetalol since yesterday

## 2015-07-15 ENCOUNTER — Ambulatory Visit (INDEPENDENT_AMBULATORY_CARE_PROVIDER_SITE_OTHER): Payer: Medicaid Other | Admitting: *Deleted

## 2015-07-15 VITALS — BP 120/63 | HR 92

## 2015-07-15 DIAGNOSIS — O10912 Unspecified pre-existing hypertension complicating pregnancy, second trimester: Secondary | ICD-10-CM | POA: Diagnosis not present

## 2015-07-15 DIAGNOSIS — O10919 Unspecified pre-existing hypertension complicating pregnancy, unspecified trimester: Secondary | ICD-10-CM

## 2015-07-15 DIAGNOSIS — O24419 Gestational diabetes mellitus in pregnancy, unspecified control: Secondary | ICD-10-CM | POA: Diagnosis not present

## 2015-07-15 NOTE — Progress Notes (Signed)
NST reviewed and reactive.  

## 2015-07-17 ENCOUNTER — Telehealth: Payer: Self-pay | Admitting: General Practice

## 2015-07-17 NOTE — Telephone Encounter (Signed)
Called patient to check on CBG levels. Patient reports not starting to check her blood sugars until yesterday. Reports 199 fasting, 125, 82, & 142 for her levels on 5/24. Levels today are 126,192, 142. Told patient I would let Dr Penne LashLeggett know. Patient had no questions

## 2015-07-18 ENCOUNTER — Other Ambulatory Visit: Payer: Self-pay | Admitting: Obstetrics & Gynecology

## 2015-07-18 ENCOUNTER — Ambulatory Visit (INDEPENDENT_AMBULATORY_CARE_PROVIDER_SITE_OTHER): Payer: Medicaid Other | Admitting: *Deleted

## 2015-07-18 VITALS — BP 125/82 | HR 101

## 2015-07-18 DIAGNOSIS — Z36 Encounter for antenatal screening of mother: Secondary | ICD-10-CM

## 2015-07-18 DIAGNOSIS — O10912 Unspecified pre-existing hypertension complicating pregnancy, second trimester: Secondary | ICD-10-CM

## 2015-07-18 DIAGNOSIS — O24410 Gestational diabetes mellitus in pregnancy, diet controlled: Secondary | ICD-10-CM | POA: Diagnosis not present

## 2015-07-18 DIAGNOSIS — O24415 Gestational diabetes mellitus in pregnancy, controlled by oral hypoglycemic drugs: Secondary | ICD-10-CM

## 2015-07-18 DIAGNOSIS — O10919 Unspecified pre-existing hypertension complicating pregnancy, unspecified trimester: Secondary | ICD-10-CM

## 2015-07-18 MED ORDER — GLYBURIDE 2.5 MG PO TABS: 2.5000 mg | ORAL_TABLET | Freq: Every day | ORAL | Status: DC

## 2015-07-18 NOTE — Progress Notes (Signed)
Pt reported elevated fasating CBGs.  Will start glyburide and see patient on May 30th.

## 2015-07-18 NOTE — Progress Notes (Signed)
NST reviewed and reactive.  

## 2015-07-22 ENCOUNTER — Other Ambulatory Visit: Payer: Medicaid Other

## 2015-07-23 ENCOUNTER — Telehealth: Payer: Self-pay | Admitting: General Practice

## 2015-07-23 NOTE — Telephone Encounter (Signed)
Called patient and informed her of new Rx for diabetes med.

## 2015-07-24 ENCOUNTER — Other Ambulatory Visit (HOSPITAL_COMMUNITY): Payer: Self-pay | Admitting: Maternal and Fetal Medicine

## 2015-07-24 ENCOUNTER — Ambulatory Visit (HOSPITAL_COMMUNITY)
Admission: RE | Admit: 2015-07-24 | Discharge: 2015-07-24 | Disposition: A | Discharge status: Home / Self Care | Payer: Medicaid Other | Source: Ambulatory Visit | Attending: Obstetrics & Gynecology | Admitting: Obstetrics & Gynecology

## 2015-07-24 ENCOUNTER — Encounter (HOSPITAL_COMMUNITY): Payer: Self-pay

## 2015-07-24 ENCOUNTER — Other Ambulatory Visit: Payer: Self-pay | Admitting: Family Medicine

## 2015-07-24 DIAGNOSIS — Z3A33 33 weeks gestation of pregnancy: Secondary | ICD-10-CM

## 2015-07-24 DIAGNOSIS — O24419 Gestational diabetes mellitus in pregnancy, unspecified control: Secondary | ICD-10-CM | POA: Diagnosis not present

## 2015-07-24 DIAGNOSIS — O09523 Supervision of elderly multigravida, third trimester: Secondary | ICD-10-CM

## 2015-07-24 DIAGNOSIS — O10013 Pre-existing essential hypertension complicating pregnancy, third trimester: Secondary | ICD-10-CM | POA: Insufficient documentation

## 2015-07-24 DIAGNOSIS — O10919 Unspecified pre-existing hypertension complicating pregnancy, unspecified trimester: Secondary | ICD-10-CM

## 2015-07-28 ENCOUNTER — Ambulatory Visit (INDEPENDENT_AMBULATORY_CARE_PROVIDER_SITE_OTHER): Payer: Medicaid Other | Admitting: Family Medicine

## 2015-07-28 VITALS — BP 111/69 | HR 99 | Wt 174.7 lb

## 2015-07-28 DIAGNOSIS — O24415 Gestational diabetes mellitus in pregnancy, controlled by oral hypoglycemic drugs: Secondary | ICD-10-CM

## 2015-07-28 DIAGNOSIS — O10912 Unspecified pre-existing hypertension complicating pregnancy, second trimester: Secondary | ICD-10-CM | POA: Diagnosis present

## 2015-07-28 DIAGNOSIS — O10919 Unspecified pre-existing hypertension complicating pregnancy, unspecified trimester: Secondary | ICD-10-CM

## 2015-07-28 DIAGNOSIS — O0993 Supervision of high risk pregnancy, unspecified, third trimester: Secondary | ICD-10-CM

## 2015-07-28 LAB — POCT URINALYSIS DIP (DEVICE)
GLUCOSE, UA: NEGATIVE mg/dL
Hgb urine dipstick: NEGATIVE
Nitrite: NEGATIVE
PH: 6.5 (ref 5.0–8.0)
PROTEIN: 30 mg/dL — AB
Specific Gravity, Urine: >=1.03 (ref 1.005–1.030)
UROBILINOGEN UA: 2 mg/dL — AB (ref 0.0–1.0)

## 2015-07-28 NOTE — Progress Notes (Signed)
Subjective:  Christine Mueller is a 40 y.o. G2P0010 at 6747w6d being seen today for ongoing prenatal care.  She is currently monitored for the following issues for this high-risk pregnancy and has Chronic hypertension during pregnancy, antepartum; Advanced maternal age in multigravida; COPD (chronic obstructive pulmonary disease) (HCC); Asthma; Smoker; Supervision of high risk pregnancy, antepartum; Gestational diabetes mellitus (GDM), antepartum; and Rh negative status during pregnancy on her problem list.  Patient reports no complaints.  Contractions: Irregular. Vag. Bleeding: None.  Movement: Present. Denies leaking of fluid.   The following portions of the patient's history were reviewed and updated as appropriate: allergies, current medications, past family history, past medical history, past social history, past surgical history and problem list. Problem list updated.  Objective:   Filed Vitals:   07/28/15 0950  BP: 111/69  Pulse: 99  Weight: 174 lb 11.2 oz (79.243 kg)    Fetal Status: Fetal Heart Rate (bpm): NST   Movement: Present     General:  Alert, oriented and cooperative. Patient is in no acute distress.  Skin: Skin is warm and dry. No rash noted.   Cardiovascular: Normal heart rate noted  Respiratory: Normal respiratory effort, no problems with respiration noted  Abdomen: Soft, gravid, appropriate for gestational age. Pain/Pressure: Present     Pelvic: Vag. Bleeding: None     Cervical exam deferred        Extremities: Normal range of motion.  Edema: None  Mental Status: Normal mood and affect. Normal behavior. Normal judgment and thought content.   Urinalysis: Urine Protein: 1+ Urine Glucose: Negative U/s 82% EFW FBS 101-166--possibly not true fasting--eating around midnight--cereal 2 hr pp 80-146 NST reviewed and reactive.  Assessment and Plan:  Pregnancy: G2P0010 at 5347w6d  1. Chronic hypertension during pregnancy, antepartum Continue labetalol, currently only  taking daily and not BID - Fetal nonstress test  2. Gestational diabetes mellitus (GDM) controlled on oral hypoglycemic drug, antepartum She is not taking her glyburide. BS are not well controlled--advised of diet, exercise and risks of poor glycemic control including stillbirth - Fetal nonstress test  3. Supervision of high risk pregnancy, antepartum, third trimester Continue prenatal care.   Preterm labor symptoms and general obstetric precautions including but not limited to vaginal bleeding, contractions, leaking of fluid and fetal movement were reviewed in detail with the patient. Please refer to After Visit Summary for other counseling recommendations.  Return in 1 week (on 08/04/2015) for OB visit and NST.   Reva Boresanya S Aleza Pew, MD

## 2015-07-28 NOTE — Patient Instructions (Addendum)
Following an appropriate diet and keeping your blood sugar under control is the most important thing to do for your health and that of your unborn baby.  Please check your blood sugar 4 times daily.  Please keep accurate BS logs and bring them with you to every visit.  Please bring your meter also.  Goals for Blood sugar should be: 1. Fasting (first thing in the morning before eating) should be less than 90.   2.  2 hours after meals should be less than 120.  Please eat 3 meals and 3 snacks.  Include protein (meat, dairy-cheese, eggs, nuts) with all meals.  Be mindful that carbohydrates increase your blood sugar.  Not just sweet food (cookies, cake, donuts, fruit, juice, soda) but also bread, pasta, rice, and potatoes.  You have to limit how many carbs you are eating.  Adding exercise, as little as 30 minutes a day can decrease your blood sugar.  Gestational Diabetes Mellitus Gestational diabetes mellitus, often simply referred to as gestational diabetes, is a type of diabetes that some women develop during pregnancy. In gestational diabetes, the pancreas does not make enough insulin (a hormone), the cells are less responsive to the insulin that is made (insulin resistance), or both.Normally, insulin moves sugars from food into the tissue cells. The tissue cells use the sugars for energy. The lack of insulin or the lack of normal response to insulin causes excess sugars to build up in the blood instead of going into the tissue cells. As a result, high blood sugar (hyperglycemia) develops. The effect of high sugar (glucose) levels can cause many problems.  RISK FACTORS You have an increased chance of developing gestational diabetes if you have a family history of diabetes and also have one or more of the following risk factors:  A body mass index over 30 (obesity).  A previous pregnancy with gestational diabetes.  An older age at the time of pregnancy. If blood glucose levels are kept in  the normal range during pregnancy, women can have a healthy pregnancy. If your blood glucose levels are not well controlled, there may be risks to you, your unborn baby (fetus), your labor and delivery, or your newborn baby.  SYMPTOMS  If symptoms are experienced, they are much like symptoms you would normally expect during pregnancy. The symptoms of gestational diabetes include:   Increased thirst (polydipsia).  Increased urination (polyuria).  Increased urination during the night (nocturia).  Weight loss. This weight loss may be rapid.  Frequent, recurring infections.  Tiredness (fatigue).  Weakness.  Vision changes, such as blurred vision.  Fruity smell to your breath.  Abdominal pain. DIAGNOSIS Diabetes is diagnosed when blood glucose levels are increased. Your blood glucose level may be checked by one or more of the following blood tests:  A fasting blood glucose test. You will not be allowed to eat for at least 8 hours before a blood sample is taken.  A random blood glucose test. Your blood glucose is checked at any time of the day regardless of when you ate.  An oral glucose tolerance test (OGTT). Your blood glucose is measured after you have not eaten (fasted) for 1-3 hours and then after you drink a glucose-containing beverage. Since the hormones that cause insulin resistance are highest at about 24-28 weeks of a pregnancy, an OGTT is usually performed during that time. If you have risk factors, you may be screened for undiagnosed type 2 diabetes at your first prenatal visit. TREATMENT  Gestational diabetes   should be managed first with diet and exercise. Medicines may be added only if they are needed.  You will need to take diabetes medicine or insulin daily to keep blood glucose levels in the desired range.  You will need to match insulin dosing with exercise and healthy food choices. If you have gestational diabetes, your treatment goal is to maintain the following  blood glucose levels:  Before meals (preprandial): at or below 95 mg/dL.  After meals (postprandial):  One hour after a meal: at or below 140 mg/dL.  Two hours after a meal: at or below 120 mg/dL. If you have pre-existing type 1 or type 2 diabetes, your treatment goal is to maintain the following blood glucose levels:  Before meals, at bedtime, and overnight: 60-99 mg/dL.  After meals: peak of 100-129 mg/dL. HOME CARE INSTRUCTIONS   Have your hemoglobin A1c level checked twice a year.  Perform daily blood glucose monitoring as directed by your health care provider. It is common to perform frequent blood glucose monitoring.  Monitor urine ketones when you are ill and as directed by your health care provider.  Take your diabetes medicine and insulin as directed by your health care provider to maintain your blood glucose level in the desired range.  Never run out of diabetes medicine or insulin. It is needed every day.  Adjust insulin based on your intake of carbohydrates. Carbohydrates can raise blood glucose levels but need to be included in your diet. Carbohydrates provide vitamins, minerals, and fiber which are an essential part of a healthy diet. Carbohydrates are found in fruits, vegetables, whole grains, dairy products, legumes, and foods containing added sugars.  Eat healthy foods. Alternate 3 meals with 3 snacks.  Maintain a healthy weight gain. The usual total expected weight gain varies according to your prepregnancy body mass index (BMI).  Carry a medical alert card or wear your medical alert jewelry.  Carry a 15-gram carbohydrate snack with you at all times to treat low blood glucose (hypoglycemia). Some examples of 15-gram carbohydrate snacks include:  Glucose tablets, 3 or 4.  Glucose gel, 15-gram tube.  Raisins, 2 tablespoons (24 g).  Jelly beans, 6.  Animal crackers, 8.  Fruit juice, regular soda, or low-fat milk, 4 ounces (120 mL).  Gummy treats,  9.  Recognize hypoglycemia. Hypoglycemia during pregnancy occurs with blood glucose levels of 60 mg/dL and below. The risk for hypoglycemia increases when fasting or skipping meals, during or after intense exercise, and during sleep. Hypoglycemia symptoms can include:  Tremors or shakes.  Decreased ability to concentrate.  Sweating.  Increased heart rate.  Headache.  Dry mouth.  Hunger.  Irritability.  Anxiety.  Restless sleep.  Altered speech or coordination.  Confusion.  Treat hypoglycemia promptly. If you are alert and able to safely swallow, follow the 15:15 rule:  Take 15-20 grams of rapid-acting glucose or carbohydrate. Rapid-acting options include glucose gel, glucose tablets, or 4 ounces (120 mL) of fruit juice, regular soda, or low-fat milk.  Check your blood glucose level 15 minutes after taking the glucose.  Take 15-20 grams more of glucose if the repeat blood glucose level is still 70 mg/dL or below.  Eat a meal or snack within 1 hour once blood glucose levels return to normal.  Be alert to polyuria (excess urination) and polydipsia (excess thirst) which are early signs of hyperglycemia. An early awareness of hyperglycemia allows for prompt treatment. Treat hyperglycemia as directed by your health care provider.  Engage in at least   30 minutes of physical activity a day or as directed by your health care provider. Ten minutes of physical activity timed 30 minutes after each meal is encouraged to control postprandial blood glucose levels.  Adjust your insulin dosing and food intake as needed if you start a new exercise or sport.  Follow your sick-day plan at any time you are unable to eat or drink as usual.  Avoid tobacco and alcohol use.  Keep all follow-up visits as directed by your health care provider.  Follow the advice of your health care provider regarding your prenatal and post-delivery (postpartum) appointments, meal planning, exercise, medicines,  vitamins, blood tests, other medical tests, and physical activities.  Perform daily skin and foot care. Examine your skin and feet daily for cuts, bruises, redness, nail problems, bleeding, blisters, or sores.  Brush your teeth and gums at least twice a day and floss at least once a day. Follow up with your dentist regularly.  Schedule an eye exam during the first trimester of your pregnancy or as directed by your health care provider.  Share your diabetes management plan with your workplace or school.  Stay up-to-date with immunizations.  Learn to manage stress.  Obtain ongoing diabetes education and support as needed.  Learn about and consider breastfeeding your baby.  You should have your blood sugar level checked 6-12 weeks after delivery. This is done with an oral glucose tolerance test (OGTT). SEEK MEDICAL CARE IF:   You are unable to eat food or drink fluids for more than 6 hours.  You have nausea and vomiting for more than 6 hours.  You have a blood glucose level of 200 mg/dL and you have ketones in your urine.  There is a change in mental status.  You develop vision problems.  You have a persistent headache.  You have upper abdominal pain or discomfort.  You develop an additional serious illness.  You have diarrhea for more than 6 hours.  You have been sick or have had a fever for a couple of days and are not getting better. SEEK IMMEDIATE MEDICAL CARE IF:   You have difficulty breathing.  You no longer feel the baby moving.  You are bleeding or have discharge from your vagina.  You start having premature contractions or labor. MAKE SURE YOU:  Understand these instructions.  Will watch your condition.  Will get help right away if you are not doing well or get worse.   This information is not intended to replace advice given to you by your health care provider. Make sure you discuss any questions you have with your health care provider.   Document  Released: 05/17/2000 Document Revised: 03/01/2014 Document Reviewed: 09/07/2011 Elsevier Interactive Patient Education 2016 [employer]  Breastfeeding Deciding to breastfeed is one of the best choices you can make for you and your baby. A change in hormones during pregnancy causes your breast tissue to grow and increases the number and size of your milk ducts. These hormones also allow proteins, sugars, and fats from your blood supply to make breast milk in your milk-producing glands. Hormones prevent breast milk from being released before your baby is born as well as prompt milk flow after birth. Once breastfeeding has begun, thoughts of your baby, as well as his or her sucking or crying, can stimulate the release of milk from your milk-producing glands.  BENEFITS OF BREASTFEEDING For Your Baby  Your first milk (colostrum) helps your baby's digestive system function better.  There are   antibodies in your milk that help your baby fight off infections.  Your baby has a lower incidence of asthma, allergies, and sudden infant death syndrome.  The nutrients in breast milk are better for your baby than infant formulas and are designed uniquely for your baby's needs.  Breast milk improves your baby's brain development.  Your baby is less likely to develop other conditions, such as childhood obesity, asthma, or type 2 diabetes mellitus. For You  Breastfeeding helps to create a very special bond between you and your baby.  Breastfeeding is convenient. Breast milk is always available at the correct temperature and costs nothing.  Breastfeeding helps to burn calories and helps you lose the weight gained during pregnancy.  Breastfeeding makes your uterus contract to its prepregnancy size faster and slows bleeding (lochia) after you give birth.   Breastfeeding helps to lower your risk of developing type 2 diabetes mellitus, osteoporosis, and breast or ovarian cancer later in life. SIGNS THAT  YOUR BABY IS HUNGRY Early Signs of Hunger  Increased alertness or activity.  Stretching.  Movement of the head from side to side.  Movement of the head and opening of the mouth when the corner of the mouth or cheek is stroked (rooting).  Increased sucking sounds, smacking lips, cooing, sighing, or squeaking.  Hand-to-mouth movements.  Increased sucking of fingers or hands. Late Signs of Hunger  Fussing.  Intermittent crying. Extreme Signs of Hunger Signs of extreme hunger will require calming and consoling before your baby will be able to breastfeed successfully. Do not wait for the following signs of extreme hunger to occur before you initiate breastfeeding:  Restlessness.  A loud, strong cry.  Screaming. BREASTFEEDING BASICS Breastfeeding Initiation  Find a comfortable place to sit or lie down, with your neck and back well supported.  Place a pillow or rolled up blanket under your baby to bring him or her to the level of your breast (if you are seated). Nursing pillows are specially designed to help support your arms and your baby while you breastfeed.  Make sure that your baby's abdomen is facing your abdomen.  Gently massage your breast. With your fingertips, massage from your chest wall toward your nipple in a circular motion. This encourages milk flow. You may need to continue this action during the feeding if your milk flows slowly.  Support your breast with 4 fingers underneath and your thumb above your nipple. Make sure your fingers are well away from your nipple and your baby's mouth.  Stroke your baby's lips gently with your finger or nipple.  When your baby's mouth is open wide enough, quickly bring your baby to your breast, placing your entire nipple and as much of the colored area around your nipple (areola) as possible into your baby's mouth.  More areola should be visible above your baby's upper lip than below the lower lip.  Your baby's tongue should  be between his or her lower gum and your breast.  Ensure that your baby's mouth is correctly positioned around your nipple (latched). Your baby's lips should create a seal on your breast and be turned out (everted).  It is common for your baby to suck about 2-3 minutes in order to start the flow of breast milk. Latching Teaching your baby how to latch on to your breast properly is very important. An improper latch can cause nipple pain and decreased milk supply for you and poor weight gain in your baby. Also, if your baby is not   latched onto your nipple properly, he or she may swallow some air during feeding. This can make your baby fussy. Burping your baby when you switch breasts during the feeding can help to get rid of the air. However, teaching your baby to latch on properly is still the best way to prevent fussiness from swallowing air while breastfeeding. Signs that your baby has successfully latched on to your nipple:  Silent tugging or silent sucking, without causing you pain.  Swallowing heard between every 3-4 sucks.  Muscle movement above and in front of his or her ears while sucking. Signs that your baby has not successfully latched on to nipple:  Sucking sounds or smacking sounds from your baby while breastfeeding.  Nipple pain. If you think your baby has not latched on correctly, slip your finger into the corner of your baby's mouth to break the suction and place it between your baby's gums. Attempt breastfeeding initiation again. Signs of Successful Breastfeeding Signs from your baby:  A gradual decrease in the number of sucks or complete cessation of sucking.  Falling asleep.  Relaxation of his or her body.  Retention of a small amount of milk in his or her mouth.  Letting go of your breast by himself or herself. Signs from you:  Breasts that have increased in firmness, weight, and size 1-3 hours after feeding.  Breasts that are softer immediately after  breastfeeding.  Increased milk volume, as well as a change in milk consistency and color by the fifth day of breastfeeding.  Nipples that are not sore, cracked, or bleeding. Signs That Your Baby is Getting Enough Milk  Wetting at least 3 diapers in a 24-hour period. The urine should be clear and pale yellow by age 5 days.  At least 3 stools in a 24-hour period by age 5 days. The stool should be soft and yellow.  At least 3 stools in a 24-hour period by age 7 days. The stool should be seedy and yellow.  No loss of weight greater than 10% of birth weight during the first 3 days of age.  Average weight gain of 4-7 ounces (113-198 g) per week after age 4 days.  Consistent daily weight gain by age 5 days, without weight loss after the age of 2 weeks. After a feeding, your baby may spit up a small amount. This is common. BREASTFEEDING FREQUENCY AND DURATION Frequent feeding will help you make more milk and can prevent sore nipples and breast engorgement. Breastfeed when you feel the need to reduce the fullness of your breasts or when your baby shows signs of hunger. This is called "breastfeeding on demand." Avoid introducing a pacifier to your baby while you are working to establish breastfeeding (the first 4-6 weeks after your baby is born). After this time you may choose to use a pacifier. Research has shown that pacifier use during the first year of a baby's life decreases the risk of sudden infant death syndrome (SIDS). Allow your baby to feed on each breast as long as he or she wants. Breastfeed until your baby is finished feeding. When your baby unlatches or falls asleep while feeding from the first breast, offer the second breast. Because newborns are often sleepy in the first few weeks of life, you may need to awaken your baby to get him or her to feed. Breastfeeding times will vary from baby to baby. However, the following rules can serve as a guide to help you ensure that your baby is    properly fed:  Newborns (babies 4 weeks of age or younger) may breastfeed every 1-3 hours.  Newborns should not go longer than 3 hours during the day or 5 hours during the night without breastfeeding.  You should breastfeed your baby a minimum of 8 times in a 24-hour period until you begin to introduce solid foods to your baby at around 6 months of age. BREAST MILK PUMPING Pumping and storing breast milk allows you to ensure that your baby is exclusively fed your breast milk, even at times when you are unable to breastfeed. This is especially important if you are going back to work while you are still breastfeeding or when you are not able to be present during feedings. Your lactation consultant can give you guidelines on how long it is safe to store breast milk. A breast pump is a machine that allows you to pump milk from your breast into a sterile bottle. The pumped breast milk can then be stored in a refrigerator or freezer. Some breast pumps are operated by hand, while others use electricity. Ask your lactation consultant which type will work best for you. Breast pumps can be purchased, but some hospitals and breastfeeding support groups lease breast pumps on a monthly basis. A lactation consultant can teach you how to hand express breast milk, if you prefer not to use a pump. CARING FOR YOUR BREASTS WHILE YOU BREASTFEED Nipples can become dry, cracked, and sore while breastfeeding. The following recommendations can help keep your breasts moisturized and healthy:  Avoid using soap on your nipples.  Wear a supportive bra. Although not required, special nursing bras and tank tops are designed to allow access to your breasts for breastfeeding without taking off your entire bra or top. Avoid wearing underwire-style bras or extremely tight bras.  Air dry your nipples for 3-4minutes after each feeding.  Use only cotton bra pads to absorb leaked breast milk. Leaking of breast milk between feedings  is normal.  Use lanolin on your nipples after breastfeeding. Lanolin helps to maintain your skin's normal moisture barrier. If you use pure lanolin, you do not need to wash it off before feeding your baby again. Pure lanolin is not toxic to your baby. You may also hand express a few drops of breast milk and gently massage that milk into your nipples and allow the milk to air dry. In the first few weeks after giving birth, some women experience extremely full breasts (engorgement). Engorgement can make your breasts feel heavy, warm, and tender to the touch. Engorgement peaks within 3-5 days after you give birth. The following recommendations can help ease engorgement:  Completely empty your breasts while breastfeeding or pumping. You may want to start by applying warm, moist heat (in the shower or with warm water-soaked hand towels) just before feeding or pumping. This increases circulation and helps the milk flow. If your baby does not completely empty your breasts while breastfeeding, pump any extra milk after he or she is finished.  Wear a snug bra (nursing or regular) or tank top for 1-2 days to signal your body to slightly decrease milk production.  Apply ice packs to your breasts, unless this is too uncomfortable for you.  Make sure that your baby is latched on and positioned properly while breastfeeding. If engorgement persists after 48 hours of following these recommendations, contact your health care provider or a lactation consultant. OVERALL HEALTH CARE RECOMMENDATIONS WHILE BREASTFEEDING  Eat healthy foods. Alternate between meals and snacks, eating 3   of each per day. Because what you eat affects your breast milk, some of the foods may make your baby more irritable than usual. Avoid eating these foods if you are sure that they are negatively affecting your baby.  Drink milk, fruit juice, and water to satisfy your thirst (about 10 glasses a day).  Rest often, relax, and continue to take  your prenatal vitamins to prevent fatigue, stress, and anemia.  Continue breast self-awareness checks.  Avoid chewing and smoking tobacco. Chemicals from cigarettes that pass into breast milk and exposure to secondhand smoke may harm your baby.  Avoid alcohol and drug use, including marijuana. Some medicines that may be harmful to your baby can pass through breast milk. It is important to ask your health care provider before taking any medicine, including all over-the-counter and prescription medicine as well as vitamin and herbal supplements. It is possible to become pregnant while breastfeeding. If birth control is desired, ask your health care provider about options that will be safe for your baby. SEEK MEDICAL CARE IF:  You feel like you want to stop breastfeeding or have become frustrated with breastfeeding.  You have painful breasts or nipples.  Your nipples are cracked or bleeding.  Your breasts are red, tender, or warm.  You have a swollen area on either breast.  You have a fever or chills.  You have nausea or vomiting.  You have drainage other than breast milk from your nipples.  Your breasts do not become full before feedings by the fifth day after you give birth.  You feel sad and depressed.  Your baby is too sleepy to eat well.  Your baby is having trouble sleeping.   Your baby is wetting less than 3 diapers in a 24-hour period.  Your baby has less than 3 stools in a 24-hour period.  Your baby's skin or the white part of his or her eyes becomes yellow.   Your baby is not gaining weight by 5 days of age. SEEK IMMEDIATE MEDICAL CARE IF:  Your baby is overly tired (lethargic) and does not want to wake up and feed.  Your baby develops an unexplained fever.   This information is not intended to replace advice given to you by your health care provider. Make sure you discuss any questions you have with your health care provider.   Document Released: 02/08/2005  Document Revised: 10/30/2014 Document Reviewed: 08/02/2012 Elsevier Interactive Patient Education 2016 [employer]  

## 2015-07-28 NOTE — Progress Notes (Signed)
Pt reports pain in Rt foot x2-3 days.  Pt states she is only taking Labetalol once daily - not twice as prescribed.

## 2015-08-01 ENCOUNTER — Ambulatory Visit (INDEPENDENT_AMBULATORY_CARE_PROVIDER_SITE_OTHER): Payer: Medicaid Other | Admitting: *Deleted

## 2015-08-01 VITALS — BP 115/77 | HR 89

## 2015-08-01 DIAGNOSIS — O24415 Gestational diabetes mellitus in pregnancy, controlled by oral hypoglycemic drugs: Secondary | ICD-10-CM | POA: Diagnosis not present

## 2015-08-01 DIAGNOSIS — O10912 Unspecified pre-existing hypertension complicating pregnancy, second trimester: Secondary | ICD-10-CM

## 2015-08-01 DIAGNOSIS — Z36 Encounter for antenatal screening of mother: Secondary | ICD-10-CM

## 2015-08-01 DIAGNOSIS — O10919 Unspecified pre-existing hypertension complicating pregnancy, unspecified trimester: Secondary | ICD-10-CM

## 2015-08-01 NOTE — Progress Notes (Signed)
US for growth scheduled 6/29, BPP added

## 2015-08-04 NOTE — Progress Notes (Signed)
NST 08/01/15 reactive 

## 2015-08-05 ENCOUNTER — Ambulatory Visit (INDEPENDENT_AMBULATORY_CARE_PROVIDER_SITE_OTHER): Payer: Medicaid Other | Admitting: Medical

## 2015-08-05 VITALS — BP 134/80 | HR 91 | Wt 172.4 lb

## 2015-08-05 DIAGNOSIS — O10912 Unspecified pre-existing hypertension complicating pregnancy, second trimester: Secondary | ICD-10-CM | POA: Diagnosis present

## 2015-08-05 DIAGNOSIS — O24415 Gestational diabetes mellitus in pregnancy, controlled by oral hypoglycemic drugs: Secondary | ICD-10-CM | POA: Diagnosis not present

## 2015-08-05 DIAGNOSIS — O10919 Unspecified pre-existing hypertension complicating pregnancy, unspecified trimester: Secondary | ICD-10-CM

## 2015-08-05 DIAGNOSIS — R12 Heartburn: Secondary | ICD-10-CM

## 2015-08-05 DIAGNOSIS — O26893 Other specified pregnancy related conditions, third trimester: Secondary | ICD-10-CM

## 2015-08-05 LAB — POCT URINALYSIS DIP (DEVICE)
Bilirubin Urine: NEGATIVE
GLUCOSE, UA: NEGATIVE mg/dL
Hgb urine dipstick: NEGATIVE
Ketones, ur: NEGATIVE mg/dL
NITRITE: NEGATIVE
PROTEIN: NEGATIVE mg/dL
Specific Gravity, Urine: 1.025 (ref 1.005–1.030)
UROBILINOGEN UA: 1 mg/dL (ref 0.0–1.0)
pH: 6.5 (ref 5.0–8.0)

## 2015-08-05 MED ORDER — PANTOPRAZOLE SODIUM 20 MG PO TBEC
20.0000 mg | DELAYED_RELEASE_TABLET | Freq: Two times a day (BID) | ORAL | Status: DC
Start: 2015-08-05 — End: 2015-09-06

## 2015-08-05 NOTE — Progress Notes (Signed)
Pt reports increased acid reflux and is vomiting during the night.

## 2015-08-05 NOTE — Patient Instructions (Signed)
Fetal Movement Counts Patient Name: __________________________________________________ Patient Due Date: ____________________ Performing a fetal movement count is highly recommended in high-risk pregnancies, but it is good for every pregnant woman to do. Your health care provider may ask you to start counting fetal movements at 28 weeks of the pregnancy. Fetal movements often increase:  After eating a full meal.  After physical activity.  After eating or drinking something sweet or cold.  At rest. Pay attention to when you feel the baby is most active. This will help you notice a pattern of your baby's sleep and wake cycles and what factors contribute to an increase in fetal movement. It is important to perform a fetal movement count at the same time each day when your baby is normally most active.  HOW TO COUNT FETAL MOVEMENTS 1. Find a quiet and comfortable area to sit or lie down on your left side. Lying on your left side provides the best blood and oxygen circulation to your baby. 2. Write down the day and time on a sheet of paper or in a journal. 3. Start counting kicks, flutters, swishes, rolls, or jabs in a 2-hour period. You should feel at least 10 movements within 2 hours. 4. If you do not feel 10 movements in 2 hours, wait 2-3 hours and count again. Look for a change in the pattern or not enough counts in 2 hours. SEEK MEDICAL CARE IF:  You feel less than 10 counts in 2 hours, tried twice.  There is no movement in over an hour.  The pattern is changing or taking longer each day to reach 10 counts in 2 hours.  You feel the baby is not moving as he or she usually does. Date: ____________ Movements: ____________ Start time: ____________ Finish time: ____________  Date: ____________ Movements: ____________ Start time: ____________ Finish time: ____________ Date: ____________ Movements: ____________ Start time: ____________ Finish time: ____________ Date: ____________ Movements:  ____________ Start time: ____________ Finish time: ____________ Date: ____________ Movements: ____________ Start time: ____________ Finish time: ____________ Date: ____________ Movements: ____________ Start time: ____________ Finish time: ____________ Date: ____________ Movements: ____________ Start time: ____________ Finish time: ____________ Date: ____________ Movements: ____________ Start time: ____________ Finish time: ____________  Date: ____________ Movements: ____________ Start time: ____________ Finish time: ____________ Date: ____________ Movements: ____________ Start time: ____________ Finish time: ____________ Date: ____________ Movements: ____________ Start time: ____________ Finish time: ____________ Date: ____________ Movements: ____________ Start time: ____________ Finish time: ____________ Date: ____________ Movements: ____________ Start time: ____________ Finish time: ____________ Date: ____________ Movements: ____________ Start time: ____________ Finish time: ____________ Date: ____________ Movements: ____________ Start time: ____________ Finish time: ____________  Date: ____________ Movements: ____________ Start time: ____________ Finish time: ____________ Date: ____________ Movements: ____________ Start time: ____________ Finish time: ____________ Date: ____________ Movements: ____________ Start time: ____________ Finish time: ____________ Date: ____________ Movements: ____________ Start time: ____________ Finish time: ____________ Date: ____________ Movements: ____________ Start time: ____________ Finish time: ____________ Date: ____________ Movements: ____________ Start time: ____________ Finish time: ____________ Date: ____________ Movements: ____________ Start time: ____________ Finish time: ____________  Date: ____________ Movements: ____________ Start time: ____________ Finish time: ____________ Date: ____________ Movements: ____________ Start time: ____________ Finish  time: ____________ Date: ____________ Movements: ____________ Start time: ____________ Finish time: ____________ Date: ____________ Movements: ____________ Start time: ____________ Finish time: ____________ Date: ____________ Movements: ____________ Start time: ____________ Finish time: ____________ Date: ____________ Movements: ____________ Start time: ____________ Finish time: ____________ Date: ____________ Movements: ____________ Start time: ____________ Finish time: ____________  Date: ____________ Movements: ____________ Start time: ____________ Finish   time: ____________ Date: ____________ Movements: ____________ Start time: ____________ Finish time: ____________ Date: ____________ Movements: ____________ Start time: ____________ Finish time: ____________ Date: ____________ Movements: ____________ Start time: ____________ Finish time: ____________ Date: ____________ Movements: ____________ Start time: ____________ Finish time: ____________ Date: ____________ Movements: ____________ Start time: ____________ Finish time: ____________ Date: ____________ Movements: ____________ Start time: ____________ Finish time: ____________  Date: ____________ Movements: ____________ Start time: ____________ Finish time: ____________ Date: ____________ Movements: ____________ Start time: ____________ Finish time: ____________ Date: ____________ Movements: ____________ Start time: ____________ Finish time: ____________ Date: ____________ Movements: ____________ Start time: ____________ Finish time: ____________ Date: ____________ Movements: ____________ Start time: ____________ Finish time: ____________ Date: ____________ Movements: ____________ Start time: ____________ Finish time: ____________ Date: ____________ Movements: ____________ Start time: ____________ Finish time: ____________  Date: ____________ Movements: ____________ Start time: ____________ Finish time: ____________ Date: ____________  Movements: ____________ Start time: ____________ Finish time: ____________ Date: ____________ Movements: ____________ Start time: ____________ Finish time: ____________ Date: ____________ Movements: ____________ Start time: ____________ Finish time: ____________ Date: ____________ Movements: ____________ Start time: ____________ Finish time: ____________ Date: ____________ Movements: ____________ Start time: ____________ Finish time: ____________ Date: ____________ Movements: ____________ Start time: ____________ Finish time: ____________  Date: ____________ Movements: ____________ Start time: ____________ Finish time: ____________ Date: ____________ Movements: ____________ Start time: ____________ Finish time: ____________ Date: ____________ Movements: ____________ Start time: ____________ Finish time: ____________ Date: ____________ Movements: ____________ Start time: ____________ Finish time: ____________ Date: ____________ Movements: ____________ Start time: ____________ Finish time: ____________ Date: ____________ Movements: ____________ Start time: ____________ Finish time: ____________   This information is not intended to replace advice given to you by your health care provider. Make sure you discuss any questions you have with your health care provider.   Document Released: 03/10/2006 Document Revised: 03/01/2014 Document Reviewed: 12/06/2011 Elsevier Interactive Patient Education 2016 [employer] Braxton Hicks Contractions Contractions of the uterus can occur throughout pregnancy. Contractions are not always a sign that you are in labor.  WHAT ARE BRAXTON HICKS CONTRACTIONS?  Contractions that occur before labor are called Braxton Hicks contractions, or false labor. Toward the end of pregnancy (32-34 weeks), these contractions can develop more often and may become more forceful. This is not true labor because these contractions do not result in opening (dilatation) and thinning of  the cervix. They are sometimes difficult to tell apart from true labor because these contractions can be forceful and people have different pain tolerances. You should not feel embarrassed if you go to the hospital with false labor. Sometimes, the only way to tell if you are in true labor is for your health care provider to look for changes in the cervix. If there are no prenatal problems or other health problems associated with the pregnancy, it is completely safe to be sent home with false labor and await the onset of true labor. HOW CAN YOU TELL THE DIFFERENCE BETWEEN TRUE AND FALSE LABOR? False Labor  The contractions of false labor are usually shorter and not as hard as those of true labor.   The contractions are usually irregular.   The contractions are often felt in the front of the lower abdomen and in the groin.   The contractions may go away when you walk around or change positions while lying down.   The contractions get weaker and are shorter lasting as time goes on.   The contractions do not usually become progressively stronger, regular, and closer together as with true labor.  True Labor 5. Contractions in true   labor last 30-70 seconds, become very regular, usually become more intense, and increase in frequency.  6. The contractions do not go away with walking.  7. The discomfort is usually felt in the top of the uterus and spreads to the lower abdomen and low back.  8. True labor can be determined by your health care provider with an exam. This will show that the cervix is dilating and getting thinner.  WHAT TO REMEMBER  Keep up with your usual exercises and follow other instructions given by your health care provider.   Take medicines as directed by your health care provider.   Keep your regular prenatal appointments.   Eat and drink lightly if you think you are going into labor.   If Braxton Hicks contractions are making you uncomfortable:   Change  your position from lying down or resting to walking, or from walking to resting.   Sit and rest in a tub of warm water.   Drink 2-3 glasses of water. Dehydration may cause these contractions.   Do slow and deep breathing several times an hour.  WHEN SHOULD I SEEK IMMEDIATE MEDICAL CARE? Seek immediate medical care if:  Your contractions become stronger, more regular, and closer together.   You have fluid leaking or gushing from your vagina.   You have a fever.   You pass blood-tinged mucus.   You have vaginal bleeding.   You have continuous abdominal pain.   You have low back pain that you never had before.   You feel your baby's head pushing down and causing pelvic pressure.   Your baby is not moving as much as it used to.    This information is not intended to replace advice given to you by your health care provider. Make sure you discuss any questions you have with your health care provider.   Document Released: 02/08/2005 Document Revised: 02/13/2013 Document Reviewed: 11/20/2012 Elsevier Interactive Patient Education 2016 [employer]  

## 2015-08-05 NOTE — Progress Notes (Signed)
Subjective:  Christine Mueller is a 40 y.o. G2P0010 at 6712w0d being seen today for ongoing prenatal care.  She is currently monitored for the following issues for this high-risk pregnancy and has Chronic hypertension during pregnancy, antepartum; Advanced maternal age in multigravida; COPD (chronic obstructive pulmonary disease) (HCC); Asthma; Smoker; Supervision of high risk pregnancy, antepartum; Gestational diabetes mellitus (GDM), antepartum; and Rh negative status during pregnancy on her problem list.  Patient reports heartburn and occasional contractions.  Contractions: Irregular. Vag. Bleeding: None.  Movement: Present. Denies leaking of fluid.   The following portions of the patient's history were reviewed and updated as appropriate: allergies, current medications, past family history, past medical history, past social history, past surgical history and problem list. Problem list updated.  Objective:   Filed Vitals:   08/05/15 0816  BP: 134/80  Pulse: 91  Weight: 172 lb 6.4 oz (78.2 kg)    Fetal Status: Fetal Heart Rate (bpm): NST   Movement: Present     General:  Alert, oriented and cooperative. Patient is in no acute distress.  Skin: Skin is warm and dry. No rash noted.   Cardiovascular: Normal heart rate noted  Respiratory: Normal respiratory effort, no problems with respiration noted  Abdomen: Soft, gravid, appropriate for gestational age. Pain/Pressure: Present     Pelvic: Cervical exam deferred        Extremities: Normal range of motion.  Edema: None  Mental Status: Normal mood and affect. Normal behavior. Normal judgment and thought content.   Urinalysis: Urine Protein: Negative Urine Glucose: Negative  Assessment and Plan:  Pregnancy: G2P0010 at 8112w0d  1. Chronic hypertension during pregnancy, antepartum - Fetal nonstress test - reactive, occasional irregular contractions  2. Gestational diabetes mellitus (GDM) controlled on oral hypoglycemic drug, antepartum - CBG  log book reviewed, good control - Fetal nonstress test  3. Heartburn in pregnancy - Advised to increase Protonix to twice daily due to increased symptoms at night, refill sent to patient's pharmacy - Discussed avoiding PO intake up to 3 hours prior to bedtime and sleeping on incline to avoid symptoms Preterm labor symptoms and general obstetric precautions including but not limited to vaginal bleeding, contractions, leaking of fluid and fetal movement were reviewed in detail with the patient. Please refer to After Visit Summary for other counseling recommendations.  Return for NST. Twice weekly testing.    Marny LowensteinJulie N Wenzel, PA-C

## 2015-08-05 NOTE — Progress Notes (Signed)
Checked CBG log, good control No headache No swelling Irregular contractions No bleeding, LOF Discussed GERD for night time Increase protonix - twice daily - will refill

## 2015-08-08 ENCOUNTER — Ambulatory Visit (INDEPENDENT_AMBULATORY_CARE_PROVIDER_SITE_OTHER): Payer: Medicaid Other | Admitting: *Deleted

## 2015-08-08 VITALS — BP 117/77 | HR 89

## 2015-08-08 DIAGNOSIS — O24415 Gestational diabetes mellitus in pregnancy, controlled by oral hypoglycemic drugs: Secondary | ICD-10-CM

## 2015-08-08 DIAGNOSIS — O10912 Unspecified pre-existing hypertension complicating pregnancy, second trimester: Secondary | ICD-10-CM | POA: Diagnosis present

## 2015-08-08 DIAGNOSIS — O10919 Unspecified pre-existing hypertension complicating pregnancy, unspecified trimester: Secondary | ICD-10-CM

## 2015-08-08 DIAGNOSIS — Z36 Encounter for antenatal screening of mother: Secondary | ICD-10-CM | POA: Diagnosis not present

## 2015-08-08 NOTE — Progress Notes (Signed)
NST reviewed and reactive.  Quinta Eimer L. Harraway-Smith, M.D., FACOG    

## 2015-08-11 ENCOUNTER — Other Ambulatory Visit (HOSPITAL_COMMUNITY)
Admission: RE | Admit: 2015-08-11 | Discharge: 2015-08-11 | Disposition: A | Discharge status: Home / Self Care | Payer: Medicaid Other | Source: Ambulatory Visit | Attending: Obstetrics and Gynecology | Admitting: Obstetrics and Gynecology

## 2015-08-11 ENCOUNTER — Ambulatory Visit (INDEPENDENT_AMBULATORY_CARE_PROVIDER_SITE_OTHER): Payer: Medicaid Other | Admitting: Obstetrics and Gynecology

## 2015-08-11 VITALS — BP 118/75 | HR 85 | Wt 168.6 lb

## 2015-08-11 DIAGNOSIS — O09523 Supervision of elderly multigravida, third trimester: Secondary | ICD-10-CM | POA: Diagnosis not present

## 2015-08-11 DIAGNOSIS — Z113 Encounter for screening for infections with a predominantly sexual mode of transmission: Secondary | ICD-10-CM | POA: Insufficient documentation

## 2015-08-11 DIAGNOSIS — O10912 Unspecified pre-existing hypertension complicating pregnancy, second trimester: Secondary | ICD-10-CM

## 2015-08-11 DIAGNOSIS — O10913 Unspecified pre-existing hypertension complicating pregnancy, third trimester: Secondary | ICD-10-CM

## 2015-08-11 DIAGNOSIS — O360190 Maternal care for anti-D [Rh] antibodies, unspecified trimester, not applicable or unspecified: Secondary | ICD-10-CM | POA: Diagnosis not present

## 2015-08-11 DIAGNOSIS — O10919 Unspecified pre-existing hypertension complicating pregnancy, unspecified trimester: Secondary | ICD-10-CM

## 2015-08-11 DIAGNOSIS — O24415 Gestational diabetes mellitus in pregnancy, controlled by oral hypoglycemic drugs: Secondary | ICD-10-CM | POA: Diagnosis not present

## 2015-08-11 DIAGNOSIS — O0993 Supervision of high risk pregnancy, unspecified, third trimester: Secondary | ICD-10-CM

## 2015-08-11 LAB — POCT URINALYSIS DIP (DEVICE)
Glucose, UA: NEGATIVE mg/dL
Hgb urine dipstick: NEGATIVE
KETONES UR: NEGATIVE mg/dL
Nitrite: NEGATIVE
PH: 6.5 (ref 5.0–8.0)
Protein, ur: 30 mg/dL — AB
Specific Gravity, Urine: 1.025 (ref 1.005–1.030)
Urobilinogen, UA: 2 mg/dL — ABNORMAL HIGH (ref 0.0–1.0)

## 2015-08-11 LAB — OB RESULTS CONSOLE GC/CHLAMYDIA: Gonorrhea: NEGATIVE

## 2015-08-11 LAB — OB RESULTS CONSOLE GBS: STREP GROUP B AG: NEGATIVE

## 2015-08-11 NOTE — Progress Notes (Signed)
US for growth on 6/29 

## 2015-08-11 NOTE — Progress Notes (Signed)
Subjective:  Christine Mueller is a 40 y.o. G2P0010 at 7270w6d being seen today for ongoing prenatal care.  She is currently monitored for the following issues for this high-risk pregnancy and has Chronic hypertension during pregnancy, antepartum; Advanced maternal age in multigravida; COPD (chronic obstructive pulmonary disease) (HCC); Asthma; Smoker; Supervision of high risk pregnancy, antepartum; Gestational diabetes mellitus (GDM), antepartum; and Rh negative status during pregnancy on her problem list.  Patient reports no complaints.   Contractions: Irregular. Vag. Bleeding: None.  Movement: Present. Denies leaking of fluid.   The following portions of the patient's history were reviewed and updated as appropriate: allergies, current medications, past family history, past medical history, past social history, past surgical history and problem list. Problem list updated.  Objective:   Filed Vitals:   08/11/15 0824  BP: 118/75  Pulse: 85  Weight: 168 lb 9.6 oz (76.476 kg)    Fetal Status: Fetal Heart Rate (bpm): NST   Movement: Present     General:  Alert, oriented and cooperative. Patient is in no acute distress.  Skin: Skin is warm and dry. No rash noted.   Cardiovascular: Normal heart rate noted  Respiratory: Normal respiratory effort, no problems with respiration noted  Abdomen: Soft, gravid, appropriate for gestational age. Pain/Pressure: Present     Pelvic:  deferred. EGBUS normal        Extremities: Normal range of motion.  Edema: None  Mental Status: Normal mood and affect. Normal behavior. Normal judgment and thought content.   Urinalysis: Urine Protein: 1+ Urine Glucose: Negative  Assessment and Plan:  Pregnancy: G2P0010 at 7970w6d  1. Chronic hypertension during pregnancy, antepartum -RNST today. continue 2x/wk testing. Has rpt growth on 6/29 (normal early June) -takes labetalol 100 qam  2. Gestational diabetes mellitus (GDM) controlled on oral hypoglycemic drug,  antepartum -didn't bring BS log book but gives normal values. Continue on glyburide 2.5mg  qhs  3. Supervision of high risk pregnancy, antepartum, third trimester -routine care. GBS with sx today (sounds like type 1 PCN rxn) and GC/CT. Depo  4.  Rh negative status during pregnancy, unspecified trimester, not applicable or unspecified fetus -s/p rhogam at 28wks. Rpt pp prn  Preterm labor symptoms and general obstetric precautions including but not limited to vaginal bleeding, contractions, leaking of fluid and fetal movement were reviewed in detail with the patient. Please refer to After Visit Summary for other counseling recommendations.  Return in about 3 days (around 08/14/2015) for 2x/wk as scheduled.   Christine Bingharlie Virgie Chery, MD

## 2015-08-11 NOTE — Addendum Note (Signed)
Addended by: Jill SideAY, DIANE L on: 08/11/2015 08:57 AM   Modules accepted: Orders

## 2015-08-12 LAB — GC/CHLAMYDIA PROBE AMP (~~LOC~~) NOT AT ARMC
Chlamydia: NEGATIVE
Neisseria Gonorrhea: NEGATIVE

## 2015-08-13 LAB — CULTURE, STREPTOCOCCUS GRP B W/SUSCEPT

## 2015-08-14 ENCOUNTER — Ambulatory Visit (INDEPENDENT_AMBULATORY_CARE_PROVIDER_SITE_OTHER): Payer: Medicaid Other | Admitting: *Deleted

## 2015-08-14 VITALS — BP 134/79 | HR 97

## 2015-08-14 DIAGNOSIS — O24415 Gestational diabetes mellitus in pregnancy, controlled by oral hypoglycemic drugs: Secondary | ICD-10-CM

## 2015-08-14 DIAGNOSIS — Z36 Encounter for antenatal screening of mother: Secondary | ICD-10-CM

## 2015-08-14 DIAGNOSIS — O10912 Unspecified pre-existing hypertension complicating pregnancy, second trimester: Secondary | ICD-10-CM

## 2015-08-14 DIAGNOSIS — O10919 Unspecified pre-existing hypertension complicating pregnancy, unspecified trimester: Secondary | ICD-10-CM

## 2015-08-18 ENCOUNTER — Ambulatory Visit (INDEPENDENT_AMBULATORY_CARE_PROVIDER_SITE_OTHER): Payer: Medicaid Other | Admitting: Family Medicine

## 2015-08-18 VITALS — BP 146/85 | HR 90 | Wt 170.0 lb

## 2015-08-18 DIAGNOSIS — O10913 Unspecified pre-existing hypertension complicating pregnancy, third trimester: Secondary | ICD-10-CM | POA: Diagnosis present

## 2015-08-18 DIAGNOSIS — O09523 Supervision of elderly multigravida, third trimester: Secondary | ICD-10-CM | POA: Diagnosis not present

## 2015-08-18 DIAGNOSIS — O10919 Unspecified pre-existing hypertension complicating pregnancy, unspecified trimester: Secondary | ICD-10-CM

## 2015-08-18 DIAGNOSIS — O24415 Gestational diabetes mellitus in pregnancy, controlled by oral hypoglycemic drugs: Secondary | ICD-10-CM | POA: Diagnosis not present

## 2015-08-18 LAB — POCT URINALYSIS DIP (DEVICE)
Bilirubin Urine: NEGATIVE
Glucose, UA: NEGATIVE mg/dL
HGB URINE DIPSTICK: NEGATIVE
Ketones, ur: NEGATIVE mg/dL
NITRITE: NEGATIVE
PH: 7 (ref 5.0–8.0)
PROTEIN: NEGATIVE mg/dL
Specific Gravity, Urine: 1.02 (ref 1.005–1.030)
UROBILINOGEN UA: 1 mg/dL (ref 0.0–1.0)

## 2015-08-18 LAB — CBC
HCT: 34.7 % — ABNORMAL LOW (ref 35.0–45.0)
Hemoglobin: 11 g/dL — ABNORMAL LOW (ref 11.7–15.5)
MCH: 28.7 pg (ref 27.0–33.0)
MCHC: 31.7 g/dL — AB (ref 32.0–36.0)
MCV: 90.6 fL (ref 80.0–100.0)
MPV: 10.1 fL (ref 7.5–12.5)
Platelets: 267 10*3/uL (ref 140–400)
RBC: 3.83 MIL/uL (ref 3.80–5.10)
RDW: 14.2 % (ref 11.0–15.0)
WBC: 8.6 10*3/uL (ref 3.8–10.8)

## 2015-08-18 LAB — COMPREHENSIVE METABOLIC PANEL
ALT: 17 U/L (ref 6–29)
AST: 16 U/L (ref 10–30)
Albumin: 3.1 g/dL — ABNORMAL LOW (ref 3.6–5.1)
Alkaline Phosphatase: 188 U/L — ABNORMAL HIGH (ref 33–115)
BILIRUBIN TOTAL: 0.4 mg/dL (ref 0.2–1.2)
BUN: 5 mg/dL — ABNORMAL LOW (ref 7–25)
CO2: 25 mmol/L (ref 20–31)
CREATININE: 0.57 mg/dL (ref 0.50–1.10)
Calcium: 8.7 mg/dL (ref 8.6–10.2)
Chloride: 107 mmol/L (ref 98–110)
GLUCOSE: 98 mg/dL (ref 65–99)
Potassium: 4.2 mmol/L (ref 3.5–5.3)
SODIUM: 138 mmol/L (ref 135–146)
Total Protein: 5.5 g/dL — ABNORMAL LOW (ref 6.1–8.1)

## 2015-08-18 NOTE — Progress Notes (Signed)
Subjective:  Christine Mueller is a 40 y.o. G2P0010 at 8783w6d being seen today for ongoing prenatal care.  She is currently monitored for the following issues for this high-risk pregnancy and has Chronic hypertension during pregnancy, antepartum; Advanced maternal age in multigravida; COPD (chronic obstructive pulmonary disease) (HCC); Asthma; Smoker; Supervision of high risk pregnancy, antepartum; Gestational diabetes mellitus (GDM), antepartum; and Rh negative status during pregnancy on her problem list.  Patient reports contractions since yesterday.  Contractions: Irritability. Vag. Bleeding: None.  Movement: Present. Denies leaking of fluid.   The following portions of the patient's history were reviewed and updated as appropriate: allergies, current medications, past family history, past medical history, past social history, past surgical history and problem list. Problem list updated.  Objective:   Filed Vitals:   08/18/15 0800 08/18/15 0835  BP: 147/86 146/85  Pulse: 90   Weight: 170 lb (77.111 kg)     Fetal Status: Fetal Heart Rate (bpm): NST   Movement: Present     General:  Alert, oriented and cooperative. Patient is in no acute distress.  Skin: Skin is warm and dry. No rash noted.   Cardiovascular: Normal heart rate noted  Respiratory: Normal respiratory effort, no problems with respiration noted  Abdomen: Soft, gravid, appropriate for gestational age. Pain/Pressure: Present     Pelvic: Cervical exam performed Dilation: 1.5 Effacement (%): 50 Station: -2  Extremities: Normal range of motion.  Edema: None  Mental Status: Normal mood and affect. Normal behavior. Normal judgment and thought content.   Urinalysis: Urine Protein: Negative Urine Glucose: Negative No book today--cannot get lancets at her pharmacy NST reviewed and reactive.  Assessment and Plan:  Pregnancy: G2P0010 at 1083w6d  1. Chronic hypertension during pregnancy, antepartum Did not take meds this am--BP  up--check labs - Fetal nonstress test - Protein / creatinine ratio, urine - CBC - Comprehensive metabolic panel  2. Gestational diabetes mellitus (GDM) controlled on oral hypoglycemic drug, antepartum No lancets at home--nurse will call pharmacy to clarify rx. U/S for growth on Thursday - Fetal nonstress test  3. Supervision of high risk pregnancy GBS is negative.  Term labor symptoms and general obstetric precautions including but not limited to vaginal bleeding, contractions, leaking of fluid and fetal movement were reviewed in detail with the patient. Please refer to After Visit Summary for other counseling recommendations.  Return in 1 week (on 08/25/2015) for OB visit and NST, NST only in 3-4 days.   Reva Boresanya S Ianna Salmela, MD

## 2015-08-18 NOTE — Progress Notes (Signed)
Reactive NST on 08/14/15

## 2015-08-18 NOTE — Patient Instructions (Signed)
Breastfeeding Deciding to breastfeed is one of the best choices you can make for you and your baby. A change in hormones during pregnancy causes your breast tissue to grow and increases the number and size of your milk ducts. These hormones also allow proteins, sugars, and fats from your blood supply to make breast milk in your milk-producing glands. Hormones prevent breast milk from being released before your baby is born as well as prompt milk flow after birth. Once breastfeeding has begun, thoughts of your baby, as well as his or her sucking or crying, can stimulate the release of milk from your milk-producing glands.  BENEFITS OF BREASTFEEDING For Your Baby  Your first milk (colostrum) helps your baby's digestive system function better.  There are antibodies in your milk that help your baby fight off infections.  Your baby has a lower incidence of asthma, allergies, and sudden infant death syndrome.  The nutrients in breast milk are better for your baby than infant formulas and are designed uniquely for your baby's needs.  Breast milk improves your baby's brain development.  Your baby is less likely to develop other conditions, such as childhood obesity, asthma, or type 2 diabetes mellitus. For You  Breastfeeding helps to create a very special bond between you and your baby.  Breastfeeding is convenient. Breast milk is always available at the correct temperature and costs nothing.  Breastfeeding helps to burn calories and helps you lose the weight gained during pregnancy.  Breastfeeding makes your uterus contract to its prepregnancy size faster and slows bleeding (lochia) after you give birth.   Breastfeeding helps to lower your risk of developing type 2 diabetes mellitus, osteoporosis, and breast or ovarian cancer later in life. SIGNS THAT YOUR BABY IS HUNGRY Early Signs of Hunger  Increased alertness or activity.  Stretching.  Movement of the head from side to  side.  Movement of the head and opening of the mouth when the corner of the mouth or cheek is stroked (rooting).  Increased sucking sounds, smacking lips, cooing, sighing, or squeaking.  Hand-to-mouth movements.  Increased sucking of fingers or hands. Late Signs of Hunger  Fussing.  Intermittent crying. Extreme Signs of Hunger Signs of extreme hunger will require calming and consoling before your baby will be able to breastfeed successfully. Do not wait for the following signs of extreme hunger to occur before you initiate breastfeeding:  Restlessness.  A loud, strong cry.  Screaming. BREASTFEEDING BASICS Breastfeeding Initiation  Find a comfortable place to sit or lie down, with your neck and back well supported.  Place a pillow or rolled up blanket under your baby to bring him or her to the level of your breast (if you are seated). Nursing pillows are specially designed to help support your arms and your baby while you breastfeed.  Make sure that your baby's abdomen is facing your abdomen.  Gently massage your breast. With your fingertips, massage from your chest wall toward your nipple in a circular motion. This encourages milk flow. You may need to continue this action during the feeding if your milk flows slowly.  Support your breast with 4 fingers underneath and your thumb above your nipple. Make sure your fingers are well away from your nipple and your baby's mouth.  Stroke your baby's lips gently with your finger or nipple.  When your baby's mouth is open wide enough, quickly bring your baby to your breast, placing your entire nipple and as much of the colored area around your nipple (  areola) as possible into your baby's mouth.  More areola should be visible above your baby's upper lip than below the lower lip.  Your baby's tongue should be between his or her lower gum and your breast.  Ensure that your baby's mouth is correctly positioned around your nipple  (latched). Your baby's lips should create a seal on your breast and be turned out (everted).  It is common for your baby to suck about 2-3 minutes in order to start the flow of breast milk. Latching Teaching your baby how to latch on to your breast properly is very important. An improper latch can cause nipple pain and decreased milk supply for you and poor weight gain in your baby. Also, if your baby is not latched onto your nipple properly, he or she may swallow some air during feeding. This can make your baby fussy. Burping your baby when you switch breasts during the feeding can help to get rid of the air. However, teaching your baby to latch on properly is still the best way to prevent fussiness from swallowing air while breastfeeding. Signs that your baby has successfully latched on to your nipple:  Silent tugging or silent sucking, without causing you pain.  Swallowing heard between every 3-4 sucks.  Muscle movement above and in front of his or her ears while sucking. Signs that your baby has not successfully latched on to nipple:  Sucking sounds or smacking sounds from your baby while breastfeeding.  Nipple pain. If you think your baby has not latched on correctly, slip your finger into the corner of your baby's mouth to break the suction and place it between your baby's gums. Attempt breastfeeding initiation again. Signs of Successful Breastfeeding Signs from your baby:  A gradual decrease in the number of sucks or complete cessation of sucking.  Falling asleep.  Relaxation of his or her body.  Retention of a small amount of milk in his or her mouth.  Letting go of your breast by himself or herself. Signs from you:  Breasts that have increased in firmness, weight, and size 1-3 hours after feeding.  Breasts that are softer immediately after breastfeeding.  Increased milk volume, as well as a change in milk consistency and color by the fifth day of breastfeeding.  Nipples  that are not sore, cracked, or bleeding. Signs That Your Baby is Getting Enough Milk  Wetting at least 3 diapers in a 24-hour period. The urine should be clear and pale yellow by age 5 days.  At least 3 stools in a 24-hour period by age 5 days. The stool should be soft and yellow.  At least 3 stools in a 24-hour period by age 7 days. The stool should be seedy and yellow.  No loss of weight greater than 10% of birth weight during the first 3 days of age.  Average weight gain of 4-7 ounces (113-198 g) per week after age 4 days.  Consistent daily weight gain by age 5 days, without weight loss after the age of 2 weeks. After a feeding, your baby may spit up a small amount. This is common. BREASTFEEDING FREQUENCY AND DURATION Frequent feeding will help you make more milk and can prevent sore nipples and breast engorgement. Breastfeed when you feel the need to reduce the fullness of your breasts or when your baby shows signs of hunger. This is called "breastfeeding on demand." Avoid introducing a pacifier to your baby while you are working to establish breastfeeding (the first 4-6 weeks   after your baby is born). After this time you may choose to use a pacifier. Research has shown that pacifier use during the first year of a baby's life decreases the risk of sudden infant death syndrome (SIDS). Allow your baby to feed on each breast as long as he or she wants. Breastfeed until your baby is finished feeding. When your baby unlatches or falls asleep while feeding from the first breast, offer the second breast. Because newborns are often sleepy in the first few weeks of life, you may need to awaken your baby to get him or her to feed. Breastfeeding times will vary from baby to baby. However, the following rules can serve as a guide to help you ensure that your baby is properly fed:  Newborns (babies 4 weeks of age or younger) may breastfeed every 1-3 hours.  Newborns should not go longer than 3 hours  during the day or 5 hours during the night without breastfeeding.  You should breastfeed your baby a minimum of 8 times in a 24-hour period until you begin to introduce solid foods to your baby at around 6 months of age. BREAST MILK PUMPING Pumping and storing breast milk allows you to ensure that your baby is exclusively fed your breast milk, even at times when you are unable to breastfeed. This is especially important if you are going back to work while you are still breastfeeding or when you are not able to be present during feedings. Your lactation consultant can give you guidelines on how long it is safe to store breast milk. A breast pump is a machine that allows you to pump milk from your breast into a sterile bottle. The pumped breast milk can then be stored in a refrigerator or freezer. Some breast pumps are operated by hand, while others use electricity. Ask your lactation consultant which type will work best for you. Breast pumps can be purchased, but some hospitals and breastfeeding support groups lease breast pumps on a monthly basis. A lactation consultant can teach you how to hand express breast milk, if you prefer not to use a pump. CARING FOR YOUR BREASTS WHILE YOU BREASTFEED Nipples can become dry, cracked, and sore while breastfeeding. The following recommendations can help keep your breasts moisturized and healthy:  Avoid using soap on your nipples.  Wear a supportive bra. Although not required, special nursing bras and tank tops are designed to allow access to your breasts for breastfeeding without taking off your entire bra or top. Avoid wearing underwire-style bras or extremely tight bras.  Air dry your nipples for 3-4minutes after each feeding.  Use only cotton bra pads to absorb leaked breast milk. Leaking of breast milk between feedings is normal.  Use lanolin on your nipples after breastfeeding. Lanolin helps to maintain your skin's normal moisture barrier. If you use  pure lanolin, you do not need to wash it off before feeding your baby again. Pure lanolin is not toxic to your baby. You may also hand express a few drops of breast milk and gently massage that milk into your nipples and allow the milk to air dry. In the first few weeks after giving birth, some women experience extremely full breasts (engorgement). Engorgement can make your breasts feel heavy, warm, and tender to the touch. Engorgement peaks within 3-5 days after you give birth. The following recommendations can help ease engorgement:  Completely empty your breasts while breastfeeding or pumping. You may want to start by applying warm, moist heat (in   the shower or with warm water-soaked hand towels) just before feeding or pumping. This increases circulation and helps the milk flow. If your baby does not completely empty your breasts while breastfeeding, pump any extra milk after he or she is finished.  Wear a snug bra (nursing or regular) or tank top for 1-2 days to signal your body to slightly decrease milk production.  Apply ice packs to your breasts, unless this is too uncomfortable for you.  Make sure that your baby is latched on and positioned properly while breastfeeding. If engorgement persists after 48 hours of following these recommendations, contact your health care provider or a lactation consultant. OVERALL HEALTH CARE RECOMMENDATIONS WHILE BREASTFEEDING  Eat healthy foods. Alternate between meals and snacks, eating 3 of each per day. Because what you eat affects your breast milk, some of the foods may make your baby more irritable than usual. Avoid eating these foods if you are sure that they are negatively affecting your baby.  Drink milk, fruit juice, and water to satisfy your thirst (about 10 glasses a day).  Rest often, relax, and continue to take your prenatal vitamins to prevent fatigue, stress, and anemia.  Continue breast self-awareness checks.  Avoid chewing and smoking  tobacco. Chemicals from cigarettes that pass into breast milk and exposure to secondhand smoke may harm your baby.  Avoid alcohol and drug use, including marijuana. Some medicines that may be harmful to your baby can pass through breast milk. It is important to ask your health care provider before taking any medicine, including all over-the-counter and prescription medicine as well as vitamin and herbal supplements. It is possible to become pregnant while breastfeeding. If birth control is desired, ask your health care provider about options that will be safe for your baby. SEEK MEDICAL CARE IF:  You feel like you want to stop breastfeeding or have become frustrated with breastfeeding.  You have painful breasts or nipples.  Your nipples are cracked or bleeding.  Your breasts are red, tender, or warm.  You have a swollen area on either breast.  You have a fever or chills.  You have nausea or vomiting.  You have drainage other than breast milk from your nipples.  Your breasts do not become full before feedings by the fifth day after you give birth.  You feel sad and depressed.  Your baby is too sleepy to eat well.  Your baby is having trouble sleeping.   Your baby is wetting less than 3 diapers in a 24-hour period.  Your baby has less than 3 stools in a 24-hour period.  Your baby's skin or the white part of his or her eyes becomes yellow.   Your baby is not gaining weight by 5 days of age. SEEK IMMEDIATE MEDICAL CARE IF:  Your baby is overly tired (lethargic) and does not want to wake up and feed.  Your baby develops an unexplained fever.   This information is not intended to replace advice given to you by your health care provider. Make sure you discuss any questions you have with your health care provider.   Document Released: 02/08/2005 Document Revised: 10/30/2014 Document Reviewed: 08/02/2012 Elsevier Interactive Patient Education 2016 [employer] Inc[employer]  

## 2015-08-19 LAB — PROTEIN / CREATININE RATIO, URINE
Creatinine, Urine: 132 mg/dL (ref 20–320)
Protein Creatinine Ratio: 189 mg/g creat — ABNORMAL HIGH (ref 21–161)
TOTAL PROTEIN, URINE: 25 mg/dL — AB (ref 5–24)

## 2015-08-21 ENCOUNTER — Ambulatory Visit (HOSPITAL_COMMUNITY)
Admission: RE | Admit: 2015-08-21 | Discharge: 2015-08-21 | Disposition: A | Discharge status: Home / Self Care | Payer: Medicaid Other | Source: Ambulatory Visit | Attending: Obstetrics & Gynecology | Admitting: Obstetrics & Gynecology

## 2015-08-21 ENCOUNTER — Encounter (HOSPITAL_COMMUNITY): Payer: Self-pay

## 2015-08-21 ENCOUNTER — Encounter: Payer: Self-pay | Admitting: Obstetrics and Gynecology

## 2015-08-21 ENCOUNTER — Inpatient Hospital Stay (HOSPITAL_COMMUNITY)
Admission: AD | Admit: 2015-08-21 | Discharge: 2015-08-21 | Disposition: A | Discharge status: Home / Self Care | Payer: Medicaid Other | Source: Ambulatory Visit | Attending: Family Medicine | Admitting: Family Medicine

## 2015-08-21 DIAGNOSIS — O99333 Smoking (tobacco) complicating pregnancy, third trimester: Secondary | ICD-10-CM | POA: Insufficient documentation

## 2015-08-21 DIAGNOSIS — Z3A37 37 weeks gestation of pregnancy: Secondary | ICD-10-CM | POA: Insufficient documentation

## 2015-08-21 DIAGNOSIS — O10013 Pre-existing essential hypertension complicating pregnancy, third trimester: Secondary | ICD-10-CM | POA: Insufficient documentation

## 2015-08-21 DIAGNOSIS — O09523 Supervision of elderly multigravida, third trimester: Secondary | ICD-10-CM | POA: Diagnosis not present

## 2015-08-21 DIAGNOSIS — F17210 Nicotine dependence, cigarettes, uncomplicated: Secondary | ICD-10-CM | POA: Diagnosis not present

## 2015-08-21 DIAGNOSIS — J449 Chronic obstructive pulmonary disease, unspecified: Secondary | ICD-10-CM | POA: Insufficient documentation

## 2015-08-21 DIAGNOSIS — O10919 Unspecified pre-existing hypertension complicating pregnancy, unspecified trimester: Secondary | ICD-10-CM

## 2015-08-21 DIAGNOSIS — O99513 Diseases of the respiratory system complicating pregnancy, third trimester: Secondary | ICD-10-CM | POA: Insufficient documentation

## 2015-08-21 DIAGNOSIS — O219 Vomiting of pregnancy, unspecified: Secondary | ICD-10-CM | POA: Diagnosis not present

## 2015-08-21 DIAGNOSIS — Z880 Allergy status to penicillin: Secondary | ICD-10-CM | POA: Diagnosis not present

## 2015-08-21 DIAGNOSIS — Z7982 Long term (current) use of aspirin: Secondary | ICD-10-CM | POA: Insufficient documentation

## 2015-08-21 DIAGNOSIS — O24415 Gestational diabetes mellitus in pregnancy, controlled by oral hypoglycemic drugs: Secondary | ICD-10-CM | POA: Diagnosis present

## 2015-08-21 DIAGNOSIS — O212 Late vomiting of pregnancy: Secondary | ICD-10-CM | POA: Insufficient documentation

## 2015-08-21 LAB — URINALYSIS, ROUTINE W REFLEX MICROSCOPIC
BILIRUBIN URINE: NEGATIVE
GLUCOSE, UA: NEGATIVE mg/dL
Hgb urine dipstick: NEGATIVE
KETONES UR: NEGATIVE mg/dL
Nitrite: NEGATIVE
PH: 7 (ref 5.0–8.0)
PROTEIN: NEGATIVE mg/dL
Specific Gravity, Urine: 1.015 (ref 1.005–1.030)

## 2015-08-21 LAB — COMPREHENSIVE METABOLIC PANEL
ALK PHOS: 177 U/L — AB (ref 38–126)
ALT: 18 U/L (ref 14–54)
ANION GAP: 9 (ref 5–15)
AST: 20 U/L (ref 15–41)
Albumin: 2.6 g/dL — ABNORMAL LOW (ref 3.5–5.0)
BILIRUBIN TOTAL: 0.5 mg/dL (ref 0.3–1.2)
BUN: 7 mg/dL (ref 6–20)
CALCIUM: 9 mg/dL (ref 8.9–10.3)
CO2: 22 mmol/L (ref 22–32)
Chloride: 104 mmol/L (ref 101–111)
Creatinine, Ser: 0.64 mg/dL (ref 0.44–1.00)
GLUCOSE: 104 mg/dL — AB (ref 65–99)
POTASSIUM: 3.4 mmol/L — AB (ref 3.5–5.1)
Sodium: 135 mmol/L (ref 135–145)
TOTAL PROTEIN: 5.9 g/dL — AB (ref 6.5–8.1)

## 2015-08-21 LAB — URINE MICROSCOPIC-ADD ON
BACTERIA UA: NONE SEEN
RBC / HPF: NONE SEEN RBC/hpf (ref 0–5)
WBC UA: NONE SEEN WBC/hpf (ref 0–5)

## 2015-08-21 LAB — GLUCOSE, CAPILLARY: Glucose-Capillary: 127 mg/dL — ABNORMAL HIGH (ref 65–99)

## 2015-08-21 MED ORDER — ONDANSETRON 8 MG PO TBDP
8.0000 mg | ORAL_TABLET | Freq: Once | ORAL | Status: AC
  Administered 2015-08-21: 8 mg via ORAL
  Filled 2015-08-21: qty 1

## 2015-08-21 MED ORDER — RANITIDINE HCL 150 MG PO TABS: 150.0000 mg | ORAL_TABLET | Freq: Two times a day (BID) | ORAL | Status: DC

## 2015-08-21 NOTE — MAU Note (Signed)
Patient presents with c/o of N/V for the past week. Patient states that she is a gestational diabetic but has not checked CBG in a week because she does not have a prescription for her finger prick needles.

## 2015-08-21 NOTE — Discharge Instructions (Signed)
Morning Sickness Morning sickness is when you feel sick to your stomach (nauseous) during pregnancy. This nauseous feeling may or may not come with vomiting. It often occurs in the morning but can be a problem any time of day. Morning sickness is most common during the first trimester, but it may continue throughout pregnancy. While morning sickness is unpleasant, it is usually harmless unless you develop severe and continual vomiting (hyperemesis gravidarum). This condition requires more intense treatment.  CAUSES  The cause of morning sickness is not completely known but seems to be related to normal hormonal changes that occur in pregnancy. RISK FACTORS You are at greater risk if you:  Experienced nausea or vomiting before your pregnancy.  Had morning sickness during a previous pregnancy.  Are pregnant with more than one baby, such as twins. TREATMENT  Do not use any medicines (prescription, over-the-counter, or herbal) for morning sickness without first talking to your health care provider. Your health care provider may prescribe or recommend:  Vitamin B6 supplements.  Anti-nausea medicines.  The herbal medicine ginger. HOME CARE INSTRUCTIONS   Only take over-the-counter or prescription medicines as directed by your health care provider.  Taking multivitamins before getting pregnant can prevent or decrease the severity of morning sickness in most women.  Eat a piece of dry toast or unsalted crackers before getting out of bed in the morning.  Eat five or six small meals a day.  Eat dry and bland foods (rice, baked potato). Foods high in carbohydrates are often helpful.  Do not drink liquids with your meals. Drink liquids between meals.  Avoid greasy, fatty, and spicy foods.  Get someone to cook for you if the smell of any food causes nausea and vomiting.  If you feel nauseous after taking prenatal vitamins, take the vitamins at night or with a snack.  Snack on protein  foods (nuts, yogurt, cheese) between meals if you are hungry.  Eat unsweetened gelatins for desserts.  Wearing an acupressure wristband (worn for sea sickness) may be helpful.  Acupuncture may be helpful.  Do not smoke.  Get a humidifier to keep the air in your house free of odors.  Get plenty of fresh air. SEEK MEDICAL CARE IF:   Your home remedies are not working, and you need medicine.  You feel dizzy or lightheaded.  You are losing weight. SEEK IMMEDIATE MEDICAL CARE IF:   You have persistent and uncontrolled nausea and vomiting.  You pass out (faint). MAKE SURE YOU:  Understand these instructions.  Will watch your condition.  Will get help right away if you are not doing well or get worse.   This information is not intended to replace advice given to you by your health care provider. Make sure you discuss any questions you have with your health care provider.   Document Released: 04/01/2006 Document Revised: 02/13/2013 Document Reviewed: 07/26/2012 Elsevier Interactive Patient Education 2016 [employer] Inc[employer] Gastroesophageal Reflux Disease, Adult Normally, food travels down the esophagus and stays in the stomach to be digested. However, when a person has gastroesophageal reflux disease (GERD), food and stomach acid move back up into the esophagus. When this happens, the esophagus becomes sore and inflamed. Over time, GERD can create small holes (ulcers) in the lining of the esophagus.  CAUSES This condition is caused by a problem with the muscle between the esophagus and the stomach (lower esophageal sphincter, or LES). Normally, the LES muscle closes after food passes through the esophagus to the stomach. When the LES  is weakened or abnormal, it does not close properly, and that allows food and stomach acid to go back up into the esophagus. The LES can be weakened by certain dietary substances, medicines, and medical conditions, including:  Tobacco  use.  Pregnancy.  Having a hiatal hernia.  Heavy alcohol use.  Certain foods and beverages, such as coffee, chocolate, onions, and peppermint. RISK FACTORS This condition is more likely to develop in:  People who have an increased body weight.  People who have connective tissue disorders.  People who use NSAID medicines. SYMPTOMS Symptoms of this condition include:  Heartburn.  Difficult or painful swallowing.  The feeling of having a lump in the throat.  Abitter taste in the mouth.  Bad breath.  Having a large amount of saliva.  Having an upset or bloated stomach.  Belching.  Chest pain.  Shortness of breath or wheezing.  Ongoing (chronic) cough or a night-time cough.  Wearing away of tooth enamel.  Weight loss. Different conditions can cause chest pain. Make sure to see your health care provider if you experience chest pain. DIAGNOSIS Your health care provider will take a medical history and perform a physical exam. To determine if you have mild or severe GERD, your health care provider may also monitor how you respond to treatment. You may also have other tests, including:  An endoscopy toexamine your stomach and esophagus with a small camera.  A test thatmeasures the acidity level in your esophagus.  A test thatmeasures how much pressure is on your esophagus.  A barium swallow or modified barium swallow to show the shape, size, and functioning of your esophagus. TREATMENT The goal of treatment is to help relieve your symptoms and to prevent complications. Treatment for this condition may vary depending on how severe your symptoms are. Your health care provider may recommend:  Changes to your diet.  Medicine.  Surgery. HOME CARE INSTRUCTIONS Diet  Follow a diet as recommended by your health care provider. This may involve avoiding foods and drinks such as:  Coffee and tea (with or without caffeine).  Drinks that containalcohol.  Energy  drinks and sports drinks.  Carbonated drinks or sodas.  Chocolate and cocoa.  Peppermint and mint flavorings.  Garlic and onions.  Horseradish.  Spicy and acidic foods, including peppers, chili powder, curry powder, vinegar, hot sauces, and barbecue sauce.  Citrus fruit juices and citrus fruits, such as oranges, lemons, and limes.  Tomato-based foods, such as red sauce, chili, salsa, and pizza with red sauce.  Fried and fatty foods, such as donuts, french fries, potato chips, and high-fat dressings.  High-fat meats, such as hot dogs and fatty cuts of red and white meats, such as rib eye steak, sausage, ham, and bacon.  High-fat dairy items, such as whole milk, butter, and cream cheese.  Eat small, frequent meals instead of large meals.  Avoid drinking large amounts of liquid with your meals.  Avoid eating meals during the 2-3 hours before bedtime.  Avoid lying down right after you eat.  Do not exercise right after you eat. General Instructions  Pay attention to any changes in your symptoms.  Take over-the-counter and prescription medicines only as told by your health care provider. Do not take aspirin, ibuprofen, or other NSAIDs unless your health care provider told you to do so.  Do not use any tobacco products, including cigarettes, chewing tobacco, and e-cigarettes. If you need help quitting, ask your health care provider.  Wear loose-fitting clothing. Do not  wear anything tight around your waist that causes pressure on your abdomen.  Raise (elevate) the head of your bed 6 inches (15cm).  Try to reduce your stress, such as with yoga or meditation. If you need help reducing stress, ask your health care provider.  If you are overweight, reduce your weight to an amount that is healthy for you. Ask your health care provider for guidance about a safe weight loss goal.  Keep all follow-up visits as told by your health care provider. This is important. SEEK MEDICAL  CARE IF:  You have new symptoms.  You have unexplained weight loss.  You have difficulty swallowing, or it hurts to swallow.  You have wheezing or a persistent cough.  Your symptoms do not improve with treatment.  You have a hoarse voice. SEEK IMMEDIATE MEDICAL CARE IF:  You have pain in your arms, neck, jaw, teeth, or back.  You feel sweaty, dizzy, or light-headed.  You have chest pain or shortness of breath.  You vomit and your vomit looks like blood or coffee grounds.  You faint.  Your stool is bloody or black.  You cannot swallow, drink, or eat.   This information is not intended to replace advice given to you by your health care provider. Make sure you discuss any questions you have with your health care provider.   Document Released: 11/18/2004 Document Revised: 10/30/2014 Document Reviewed: 06/05/2014 Elsevier Interactive Patient Education [employer]

## 2015-08-21 NOTE — MAU Provider Note (Signed)
History     CSN: [id number]  Arrival date and time: 08/21/15 1454   First Provider Initiated Contact with Patient 08/21/15 1600      Chief Complaint  Patient presents with  . Vomiting   HPI   Christine Mueller is a 40 y.o. female, 104w2d G2P0010, with a history of gestational diabetes and chronic hypertension who presents to the MAU complaining of nausea and vomiting for a week and that she hasn't checked her blood sugars since 6/18 due to running out of lancets. Patient states the nausea and vomiting has been going on for about a week but got worse at 9am this morning. She reports vomiting with meals and has been chewing ice to stay hydrated. She has tried Tums to help with the nausea with no relief. Patient states she was diagnosed with gestational diabetes about a month ago to be managed with low carb diet and glyburide, and was to check her sugars before and after meals and at bedtime. She last checked her blood sugar at 10:30AM on 6/18 when it was 133. She does report increased urination and thirst but denies abdominal pain, fever, headache, chest pain, or shortness of breath. She is feeling the baby move.   There seems to be some confusion about the type of lancet she is using at home. The significant other states she has plenty at home and the patient is not understanding that a lancet is the type of needle that is used to check her BS.    OB History    Gravida Para Term Preterm AB TAB SAB Ectopic Multiple Living   2    1  1    0      Past Medical History  Diagnosis Date  . Hypertension   . COPD (chronic obstructive pulmonary disease) (HAssumption   . Asthma   . Gestational diabetes     Past Surgical History  Procedure Laterality Date  . Wisdom teeth removal      Family History  Problem Relation Age of Onset  . Diabetes Mother   . Hypertension Mother     Social History  Substance Use Topics  . Smoking status: Current Every Day Smoker -- 1.00 packs/day for 15 years     Types: Cigarettes  . Smokeless tobacco: Never Used     Comment: Discussed need to quit for baby, asthma and COPD. Information given  . Alcohol Use: No    Allergies:  Allergies  Allergen Reactions  . Penicillins Swelling    Has patient had a PCN reaction causing immediate rash, facial/tongue/throat swelling, SOB or lightheadedness with hypotension: Yes Has patient had a PCN reaction causing severe rash involving mucus membranes or skin necrosis: No Has patient had a PCN reaction that required hospitalization Yes Has patient had a PCN reaction occurring within the last 10 years: No If all of the above answers are "NO", then may proceed with Cephalosporin use.     Prescriptions prior to admission  Medication Sig Dispense Refill Last Dose  . glyBURIDE (DIABETA) 2.5 MG tablet Take 1 tablet (2.5 mg total) by mouth at bedtime. 30 tablet 2 Past Week at Unknown time  . labetalol (NORMODYNE) 100 MG tablet Take 1 tablet (100 mg total) by mouth 2 (two) times daily. (Patient taking differently: Take 100 mg by mouth 2 (two) times daily. Pt states she only takes once daily in am) 60 tablet 2 08/21/2015 at Unknown time  . loratadine (CLARITIN) 10 MG tablet Take 1 tablet (10 mg total)  by mouth daily as needed for allergies. 30 tablet 6 Past Week at Unknown time  . nicotine (NICODERM CQ - DOSED IN MG/24 HOURS) 21 mg/24hr patch Place 21 mg onto the skin daily. Reported on 07/28/2015   Past Week at Unknown time  . pantoprazole (PROTONIX) 20 MG tablet Take 1 tablet (20 mg total) by mouth 2 (two) times daily. 60 tablet 2 08/21/2015 at Unknown time  . Prenatal Vit-Fe Fumarate-FA (PRENATAL MULTIVITAMIN) TABS tablet Take 1 tablet by mouth daily at 12 noon.   08/21/2015 at Unknown time  . ACCU-CHEK FASTCLIX LANCETS MISC 1 Device by Percutaneous route 4 (four) times daily. (Patient not taking: Reported on 08/21/2015) 100 each 12 Not Taking  . albuterol (PROVENTIL HFA;VENTOLIN HFA) 108 (90 Base) MCG/ACT inhaler Inhale 2  puffs into the lungs every 6 (six) hours as needed for wheezing. 1 Inhaler 6 Rescue  . aspirin 81 MG chewable tablet Chew 1 tablet (81 mg total) by mouth daily. (Patient not taking: Reported on 08/21/2015) 30 tablet 2 Not Taking at Unknown time  . Blood Glucose Monitoring Suppl (ACCU-CHEK AVIVA PLUS) w/Device KIT 1 Device by Does not apply route once. 1 kit 0 Taking  . glucose blood test strip Use as instructed 100 each 12 Taking   Review of Systems  Constitutional: Negative for fever and chills.  Gastrointestinal: Positive for nausea and vomiting.   Physical Exam   Blood pressure 125/74, pulse 91, temperature 98.8 F (37.1 C), temperature source Oral, resp. rate 18, last menstrual period 12/03/2014, unknown if currently breastfeeding.  Physical Exam  Constitutional: She is oriented to person, place, and time. She appears well-developed and well-nourished. No distress.  HENT:  Head: Normocephalic.  Eyes: Pupils are equal, round, and reactive to light.  GI: Soft. She exhibits no distension. There is no tenderness. There is no rebound and no guarding.  Musculoskeletal: Normal range of motion.  Neurological: She is alert and oriented to person, place, and time.  Skin: Skin is warm. She is not diaphoretic.  Psychiatric: Her behavior is normal.   Fetal Tracing: Baseline: 145 bpm  Variability: Moderate Accelerations: 15x15 Decelerations: none Toco: none   MAU Course  Procedures  none  MDM  Zofran 8 mg PO Patient tolerating po fluids and crackers  Discussed patient with Dr. Nehemiah Settle   Assessment and Plan   A:  1. Nausea and vomiting in pregnancy     P:  Discharge home in stable condition Confirmed that patient has appropriate material to check BS at home.  Discussed the importance of checking BS at home [facility] to take Zofran, patient has some at home Return to MAU if symptoms worsen Labor precautions.  Fetal kick counts.   Lezlie Lye, NP 08/27/2015 1:43 PM

## 2015-08-25 ENCOUNTER — Ambulatory Visit (INDEPENDENT_AMBULATORY_CARE_PROVIDER_SITE_OTHER): Payer: Medicaid Other | Admitting: Obstetrics and Gynecology

## 2015-08-25 VITALS — BP 136/82 | HR 83 | Wt 169.5 lb

## 2015-08-25 DIAGNOSIS — O10913 Unspecified pre-existing hypertension complicating pregnancy, third trimester: Secondary | ICD-10-CM

## 2015-08-25 DIAGNOSIS — O0993 Supervision of high risk pregnancy, unspecified, third trimester: Secondary | ICD-10-CM

## 2015-08-25 DIAGNOSIS — O09523 Supervision of elderly multigravida, third trimester: Secondary | ICD-10-CM

## 2015-08-25 DIAGNOSIS — O10919 Unspecified pre-existing hypertension complicating pregnancy, unspecified trimester: Secondary | ICD-10-CM

## 2015-08-25 DIAGNOSIS — O360190 Maternal care for anti-D [Rh] antibodies, unspecified trimester, not applicable or unspecified: Secondary | ICD-10-CM

## 2015-08-25 DIAGNOSIS — O24415 Gestational diabetes mellitus in pregnancy, controlled by oral hypoglycemic drugs: Secondary | ICD-10-CM

## 2015-08-25 LAB — POCT URINALYSIS DIP (DEVICE)
BILIRUBIN URINE: NEGATIVE
Glucose, UA: NEGATIVE mg/dL
HGB URINE DIPSTICK: NEGATIVE
Ketones, ur: NEGATIVE mg/dL
Nitrite: NEGATIVE
Protein, ur: NEGATIVE mg/dL
SPECIFIC GRAVITY, URINE: 1.02 (ref 1.005–1.030)
Urobilinogen, UA: 1 mg/dL (ref 0.0–1.0)
pH: 6 (ref 5.0–8.0)

## 2015-08-25 NOTE — Progress Notes (Signed)
Subjective:  Christine Mueller is a 40 y.o. G2P0010 at 2759w6d being seen today for ongoing prenatal care.  She is currently monitored for the following issues for this high-risk pregnancy and has Chronic hypertension during pregnancy, antepartum; Advanced maternal age in multigravida; COPD (chronic obstructive pulmonary disease) (HCC); Asthma; Smoker; Supervision of high risk pregnancy, antepartum; Gestational diabetes mellitus (GDM), antepartum; and Rh negative status during pregnancy on her problem list.  Patient reports no complaints.  Contractions: Irregular. Vag. Bleeding: None.  Movement: Present. Denies leaking of fluid.   The following portions of the patient's history were reviewed and updated as appropriate: allergies, current medications, past family history, past medical history, past social history, past surgical history and problem list. Problem list updated.  Objective:   Filed Vitals:   08/25/15 0815  BP: 136/82  Pulse: 83  Weight: 169 lb 8 oz (76.885 kg)    Fetal Status: Fetal Heart Rate (bpm): NST   Movement: Present     General:  Alert, oriented and cooperative. Patient is in no acute distress.  Skin: Skin is warm and dry. No rash noted.   Cardiovascular: Normal heart rate noted  Respiratory: Normal respiratory effort, no problems with respiration noted  Abdomen: Soft, gravid, appropriate for gestational age. Pain/Pressure: Present     Pelvic: Cervical exam deferred        Extremities: Normal range of motion.  Edema: None  Mental Status: Normal mood and affect. Normal behavior. Normal judgment and thought content.   Urinalysis:      Assessment and Plan:  Pregnancy: G2P0010 at 2559w6d  1. Advanced maternal age in multigravida, third trimester   2. Supervision of high risk pregnancy, antepartum, third trimester Patient scheduled for IOL on 7/11.  Questions regarding induction process answered  3. Gestational diabetes mellitus (GDM) controlled on oral hypoglycemic  drug, antepartum Patient continues to take glyburide 2.5 mg at bedtime. She did not bring log book She reports fasting as high as 167 but values mainly in the high 90's. PP as high as 119  4. Rh negative status during pregnancy, unspecified trimester, not applicable or unspecified fetus S/p rhogam  5. Chronic hypertension during pregnancy, antepartum Continue labetalol Reviewed si/sx of preeclampsia NST reviewed and reactive  Term labor symptoms and general obstetric precautions including but not limited to vaginal bleeding, contractions, leaking of fluid and fetal movement were reviewed in detail with the patient. Please refer to After Visit Summary for other counseling recommendations.  Return in about 3 days (around 08/28/2015) for NST/AFI.   Catalina AntiguaPeggy Arnesia Vincelette, MD

## 2015-08-25 NOTE — Progress Notes (Signed)
IOL scheduled 7/11

## 2015-08-27 ENCOUNTER — Inpatient Hospital Stay (HOSPITAL_COMMUNITY)
Admission: AD | Admit: 2015-08-27 | Discharge: 2015-08-27 | Disposition: A | Discharge status: Home / Self Care | Payer: Medicaid Other | Source: Ambulatory Visit | Attending: Obstetrics & Gynecology | Admitting: Obstetrics & Gynecology

## 2015-08-27 ENCOUNTER — Encounter (HOSPITAL_COMMUNITY): Payer: Self-pay

## 2015-08-27 DIAGNOSIS — Z3493 Encounter for supervision of normal pregnancy, unspecified, third trimester: Secondary | ICD-10-CM | POA: Insufficient documentation

## 2015-08-27 DIAGNOSIS — O10919 Unspecified pre-existing hypertension complicating pregnancy, unspecified trimester: Secondary | ICD-10-CM

## 2015-08-27 NOTE — MAU Note (Signed)
Notified provider that patient is here for a labor eval. Patient is 2/50/-2. Patient had one variable but after a position change fetal HR WNL. Provider said to watch patient for an hour then recheck her.

## 2015-08-27 NOTE — MAU Note (Signed)
Pt reports contractions all day, denies bleeding or ROM

## 2015-08-27 NOTE — Discharge Instructions (Signed)
Braxton Hicks Contractions °Contractions of the uterus can occur throughout pregnancy. Contractions are not always a sign that you are in labor.  °WHAT ARE BRAXTON HICKS CONTRACTIONS?  °Contractions that occur before labor are called Braxton Hicks contractions, or false labor. Toward the end of pregnancy (32-34 weeks), these contractions can develop more often and may become more forceful. This is not true labor because these contractions do not result in opening (dilatation) and thinning of the cervix. They are sometimes difficult to tell apart from true labor because these contractions can be forceful and people have different pain tolerances. You should not feel embarrassed if you go to the hospital with false labor. Sometimes, the only way to tell if you are in true labor is for your health care provider to look for changes in the cervix. °If there are no prenatal problems or other health problems associated with the pregnancy, it is completely safe to be sent home with false labor and await the onset of true labor. °HOW CAN YOU TELL THE DIFFERENCE BETWEEN TRUE AND FALSE LABOR? °False Labor °· The contractions of false labor are usually shorter and not as hard as those of true labor.   °· The contractions are usually irregular.   °· The contractions are often felt in the front of the lower abdomen and in the groin.   °· The contractions may go away when you walk around or change positions while lying down.   °· The contractions get weaker and are shorter lasting as time goes on.   °· The contractions do not usually become progressively stronger, regular, and closer together as with true labor.   °True Labor °· Contractions in true labor last 30-70 seconds, become very regular, usually become more intense, and increase in frequency.   °· The contractions do not go away with walking.   °· The discomfort is usually felt in the top of the uterus and spreads to the lower abdomen and low back.   °· True labor can be  determined by your health care provider with an exam. This will show that the cervix is dilating and getting thinner.   °WHAT TO REMEMBER °· Keep up with your usual exercises and follow other instructions given by your health care provider.   °· Take medicines as directed by your health care provider.   °· Keep your regular prenatal appointments.   °· Eat and drink lightly if you think you are going into labor.   °· If Braxton Hicks contractions are making you uncomfortable:   °¨ Change your position from lying down or resting to walking, or from walking to resting.   °¨ Sit and rest in a tub of warm water.   °¨ Drink 2-3 glasses of water. Dehydration may cause these contractions.   °¨ Do slow and deep breathing several times an hour.   °WHEN SHOULD I SEEK IMMEDIATE MEDICAL CARE? °Seek immediate medical care if: °· Your contractions become stronger, more regular, and closer together.   °· You have fluid leaking or gushing from your vagina.   °· You have a fever.   °· You pass blood-tinged mucus.   °· You have vaginal bleeding.   °· You have continuous abdominal pain.   °· You have low back pain that you never had before.   °· You feel your baby's head pushing down and causing pelvic pressure.   °· Your baby is not moving as much as it used to.   °  °This information is not intended to replace advice given to you by your health care provider. Make sure you discuss any questions you have with your health care   provider. °  °Document Released: 02/08/2005 Document Revised: 02/13/2013 Document Reviewed: 11/20/2012 °Elsevier Interactive Patient Education ©2016 [employer] °Fetal Movement Counts °Patient Name: __________________________________________________ Patient Due Date: ____________________ °Performing a fetal movement count is highly recommended in high-risk pregnancies, but it is good for every pregnant woman to do. Your health care provider may ask you to start counting fetal movements at 28 weeks of the  pregnancy. Fetal movements often increase: °· After eating a full meal. °· After physical activity. °· After eating or drinking something sweet or cold. °· At rest. °Pay attention to when you feel the baby is most active. This will help you notice a pattern of your baby's sleep and wake cycles and what factors contribute to an increase in fetal movement. It is important to perform a fetal movement count at the same time each day when your baby is normally most active.  °HOW TO COUNT FETAL MOVEMENTS °· Find a quiet and comfortable area to sit or lie down on your left side. Lying on your left side provides the best blood and oxygen circulation to your baby. °· Write down the day and time on a sheet of paper or in a journal. °· Start counting kicks, flutters, swishes, rolls, or jabs in a 2-hour period. You should feel at least 10 movements within 2 hours. °· If you do not feel 10 movements in 2 hours, wait 2-3 hours and count again. Look for a change in the pattern or not enough counts in 2 hours. °SEEK MEDICAL CARE IF: °· You feel less than 10 counts in 2 hours, tried twice. °· There is no movement in over an hour. °· The pattern is changing or taking longer each day to reach 10 counts in 2 hours. °· You feel the baby is not moving as he or she usually does. °Date: ____________ Movements: ____________ Start time: ____________ Finish time: ____________  °Date: ____________ Movements: ____________ Start time: ____________ Finish time: ____________ °Date: ____________ Movements: ____________ Start time: ____________ Finish time: ____________ °Date: ____________ Movements: ____________ Start time: ____________ Finish time: ____________ °Date: ____________ Movements: ____________ Start time: ____________ Finish time: ____________ °Date: ____________ Movements: ____________ Start time: ____________ Finish time: ____________ °Date: ____________ Movements: ____________ Start time: ____________ Finish time: ____________ °Date:  ____________ Movements: ____________ Start time: ____________ Finish time: ____________  °Date: ____________ Movements: ____________ Start time: ____________ Finish time: ____________ °Date: ____________ Movements: ____________ Start time: ____________ Finish time: ____________ °Date: ____________ Movements: ____________ Start time: ____________ Finish time: ____________ °Date: ____________ Movements: ____________ Start time: ____________ Finish time: ____________ °Date: ____________ Movements: ____________ Start time: ____________ Finish time: ____________ °Date: ____________ Movements: ____________ Start time: ____________ Finish time: ____________ °Date: ____________ Movements: ____________ Start time: ____________ Finish time: ____________  °Date: ____________ Movements: ____________ Start time: ____________ Finish time: ____________ °Date: ____________ Movements: ____________ Start time: ____________ Finish time: ____________ °Date: ____________ Movements: ____________ Start time: ____________ Finish time: ____________ °Date: ____________ Movements: ____________ Start time: ____________ Finish time: ____________ °Date: ____________ Movements: ____________ Start time: ____________ Finish time: ____________ °Date: ____________ Movements: ____________ Start time: ____________ Finish time: ____________ °Date: ____________ Movements: ____________ Start time: ____________ Finish time: ____________  °Date: ____________ Movements: ____________ Start time: ____________ Finish time: ____________ °Date: ____________ Movements: ____________ Start time: ____________ Finish time: ____________ °Date: ____________ Movements: ____________ Start time: ____________ Finish time: ____________ °Date: ____________ Movements: ____________ Start time: ____________ Finish time: ____________ °Date: ____________ Movements: ____________ Start time: ____________ Finish time: ____________ °Date: ____________ Movements: ____________ Start  time: ____________ Finish time:   ____________ °Date: ____________ Movements: ____________ Start time: ____________ Finish time: ____________  °Date: ____________ Movements: ____________ Start time: ____________ Finish time: ____________ °Date: ____________ Movements: ____________ Start time: ____________ Finish time: ____________ °Date: ____________ Movements: ____________ Start time: ____________ Finish time: ____________ °Date: ____________ Movements: ____________ Start time: ____________ Finish time: ____________ °Date: ____________ Movements: ____________ Start time: ____________ Finish time: ____________ °Date: ____________ Movements: ____________ Start time: ____________ Finish time: ____________ °Date: ____________ Movements: ____________ Start time: ____________ Finish time: ____________  °Date: ____________ Movements: ____________ Start time: ____________ Finish time: ____________ °Date: ____________ Movements: ____________ Start time: ____________ Finish time: ____________ °Date: ____________ Movements: ____________ Start time: ____________ Finish time: ____________ °Date: ____________ Movements: ____________ Start time: ____________ Finish time: ____________ °Date: ____________ Movements: ____________ Start time: ____________ Finish time: ____________ °Date: ____________ Movements: ____________ Start time: ____________ Finish time: ____________ °Date: ____________ Movements: ____________ Start time: ____________ Finish time: ____________  °Date: ____________ Movements: ____________ Start time: ____________ Finish time: ____________ °Date: ____________ Movements: ____________ Start time: ____________ Finish time: ____________ °Date: ____________ Movements: ____________ Start time: ____________ Finish time: ____________ °Date: ____________ Movements: ____________ Start time: ____________ Finish time: ____________ °Date: ____________ Movements: ____________ Start time: ____________ Finish time:  ____________ °Date: ____________ Movements: ____________ Start time: ____________ Finish time: ____________ °Date: ____________ Movements: ____________ Start time: ____________ Finish time: ____________  °Date: ____________ Movements: ____________ Start time: ____________ Finish time: ____________ °Date: ____________ Movements: ____________ Start time: ____________ Finish time: ____________ °Date: ____________ Movements: ____________ Start time: ____________ Finish time: ____________ °Date: ____________ Movements: ____________ Start time: ____________ Finish time: ____________ °Date: ____________ Movements: ____________ Start time: ____________ Finish time: ____________ °Date: ____________ Movements: ____________ Start time: ____________ Finish time: ____________ °  °This information is not intended to replace advice given to you by your health care provider. Make sure you discuss any questions you have with your health care provider. °  °Document Released: 03/10/2006 Document Revised: 03/01/2014 Document Reviewed: 12/06/2011 °Elsevier Interactive Patient Education ©2016 [employer] ° °

## 2015-08-28 ENCOUNTER — Ambulatory Visit (INDEPENDENT_AMBULATORY_CARE_PROVIDER_SITE_OTHER): Payer: Medicaid Other | Admitting: *Deleted

## 2015-08-28 ENCOUNTER — Telehealth (HOSPITAL_COMMUNITY): Payer: Self-pay | Admitting: *Deleted

## 2015-08-28 VITALS — BP 133/86 | HR 81

## 2015-08-28 DIAGNOSIS — O24415 Gestational diabetes mellitus in pregnancy, controlled by oral hypoglycemic drugs: Secondary | ICD-10-CM

## 2015-08-28 DIAGNOSIS — O10912 Unspecified pre-existing hypertension complicating pregnancy, second trimester: Secondary | ICD-10-CM | POA: Diagnosis present

## 2015-08-28 DIAGNOSIS — O10919 Unspecified pre-existing hypertension complicating pregnancy, unspecified trimester: Secondary | ICD-10-CM

## 2015-08-28 DIAGNOSIS — Z36 Encounter for antenatal screening of mother: Secondary | ICD-10-CM

## 2015-08-28 NOTE — Telephone Encounter (Signed)
Preadmission screen  

## 2015-08-28 NOTE — Progress Notes (Signed)
IOL scheduled 7/11 

## 2015-08-28 NOTE — Progress Notes (Signed)
7/6 NST reviewed and reactive 

## 2015-09-01 ENCOUNTER — Encounter (HOSPITAL_COMMUNITY): Payer: Self-pay | Admitting: *Deleted

## 2015-09-01 ENCOUNTER — Telehealth (HOSPITAL_COMMUNITY): Payer: Self-pay | Admitting: *Deleted

## 2015-09-01 NOTE — Telephone Encounter (Signed)
Preadmission screen  

## 2015-09-02 ENCOUNTER — Encounter (HOSPITAL_COMMUNITY): Payer: Self-pay

## 2015-09-02 ENCOUNTER — Inpatient Hospital Stay (HOSPITAL_COMMUNITY)
Admission: RE | Admit: 2015-09-02 | Discharge: 2015-09-06 | DRG: 766 | Disposition: A | Discharge status: Home / Self Care | Payer: Medicaid Other | Source: Ambulatory Visit | Attending: Obstetrics & Gynecology | Admitting: Obstetrics & Gynecology

## 2015-09-02 VITALS — BP 128/63 | HR 61 | Temp 98.1°F | Resp 18 | Ht 63.0 in | Wt 167.0 lb

## 2015-09-02 DIAGNOSIS — Z8249 Family history of ischemic heart disease and other diseases of the circulatory system: Secondary | ICD-10-CM

## 2015-09-02 DIAGNOSIS — O10919 Unspecified pre-existing hypertension complicating pregnancy, unspecified trimester: Secondary | ICD-10-CM

## 2015-09-02 DIAGNOSIS — O0990 Supervision of high risk pregnancy, unspecified, unspecified trimester: Secondary | ICD-10-CM

## 2015-09-02 DIAGNOSIS — Z3A39 39 weeks gestation of pregnancy: Secondary | ICD-10-CM

## 2015-09-02 DIAGNOSIS — O24425 Gestational diabetes mellitus in childbirth, controlled by oral hypoglycemic drugs: Secondary | ICD-10-CM | POA: Diagnosis present

## 2015-09-02 DIAGNOSIS — O9952 Diseases of the respiratory system complicating childbirth: Secondary | ICD-10-CM | POA: Diagnosis present

## 2015-09-02 DIAGNOSIS — K219 Gastro-esophageal reflux disease without esophagitis: Secondary | ICD-10-CM | POA: Diagnosis present

## 2015-09-02 DIAGNOSIS — J449 Chronic obstructive pulmonary disease, unspecified: Secondary | ICD-10-CM | POA: Diagnosis present

## 2015-09-02 DIAGNOSIS — Z833 Family history of diabetes mellitus: Secondary | ICD-10-CM

## 2015-09-02 DIAGNOSIS — O99334 Smoking (tobacco) complicating childbirth: Secondary | ICD-10-CM | POA: Diagnosis present

## 2015-09-02 DIAGNOSIS — O1002 Pre-existing essential hypertension complicating childbirth: Principal | ICD-10-CM | POA: Diagnosis present

## 2015-09-02 DIAGNOSIS — Z8632 Personal history of gestational diabetes: Secondary | ICD-10-CM | POA: Diagnosis present

## 2015-09-02 DIAGNOSIS — O324XX0 Maternal care for high head at term, not applicable or unspecified: Secondary | ICD-10-CM | POA: Diagnosis not present

## 2015-09-02 DIAGNOSIS — F17210 Nicotine dependence, cigarettes, uncomplicated: Secondary | ICD-10-CM | POA: Diagnosis present

## 2015-09-02 DIAGNOSIS — Z6791 Unspecified blood type, Rh negative: Secondary | ICD-10-CM | POA: Diagnosis not present

## 2015-09-02 DIAGNOSIS — O9962 Diseases of the digestive system complicating childbirth: Secondary | ICD-10-CM | POA: Diagnosis present

## 2015-09-02 DIAGNOSIS — O24429 Gestational diabetes mellitus in childbirth, unspecified control: Secondary | ICD-10-CM | POA: Diagnosis not present

## 2015-09-02 DIAGNOSIS — O1092 Unspecified pre-existing hypertension complicating childbirth: Secondary | ICD-10-CM | POA: Diagnosis not present

## 2015-09-02 DIAGNOSIS — O09529 Supervision of elderly multigravida, unspecified trimester: Secondary | ICD-10-CM

## 2015-09-02 DIAGNOSIS — O26893 Other specified pregnancy related conditions, third trimester: Secondary | ICD-10-CM | POA: Diagnosis present

## 2015-09-02 DIAGNOSIS — Z98891 History of uterine scar from previous surgery: Secondary | ICD-10-CM

## 2015-09-02 LAB — COMPREHENSIVE METABOLIC PANEL
ALBUMIN: 2.7 g/dL — AB (ref 3.5–5.0)
ALT: 21 U/L (ref 14–54)
AST: 22 U/L (ref 15–41)
Alkaline Phosphatase: 195 U/L — ABNORMAL HIGH (ref 38–126)
Anion gap: 8 (ref 5–15)
BUN: 9 mg/dL (ref 6–20)
CHLORIDE: 105 mmol/L (ref 101–111)
CO2: 22 mmol/L (ref 22–32)
Calcium: 8.6 mg/dL — ABNORMAL LOW (ref 8.9–10.3)
Creatinine, Ser: 0.65 mg/dL (ref 0.44–1.00)
GFR calc Af Amer: >60 mL/min (ref >60)
GFR calc non Af Amer: >60 mL/min (ref >60)
GLUCOSE: 108 mg/dL — AB (ref 65–99)
POTASSIUM: 3.6 mmol/L (ref 3.5–5.1)
SODIUM: 135 mmol/L (ref 135–145)
Total Bilirubin: 0.5 mg/dL (ref 0.3–1.2)
Total Protein: 5.8 g/dL — ABNORMAL LOW (ref 6.5–8.1)

## 2015-09-02 LAB — GLUCOSE, CAPILLARY
GLUCOSE-CAPILLARY: 92 mg/dL (ref 65–99)
GLUCOSE-CAPILLARY: 74 mg/dL (ref 65–99)
GLUCOSE-CAPILLARY: 74 mg/dL (ref 65–99)
GLUCOSE-CAPILLARY: 80 mg/dL (ref 65–99)

## 2015-09-02 LAB — CBC
HCT: 37.4 % (ref 36.0–46.0)
HEMOGLOBIN: 12.3 g/dL (ref 12.0–15.0)
MCH: 28.9 pg (ref 26.0–34.0)
MCHC: 32.9 g/dL (ref 30.0–36.0)
MCV: 87.8 fL (ref 78.0–100.0)
Platelets: 248 10*3/uL (ref 150–400)
RBC: 4.26 MIL/uL (ref 3.87–5.11)
RDW: 14.9 % (ref 11.5–15.5)
WBC: 6.8 10*3/uL (ref 4.0–10.5)

## 2015-09-02 LAB — HIV ANTIBODY (ROUTINE TESTING W REFLEX): HIV Screen 4th Generation wRfx: NONREACTIVE

## 2015-09-02 LAB — ABO/RH: ABO/RH(D): B NEG

## 2015-09-02 LAB — TYPE AND SCREEN
ABO/RH(D): B NEG
Antibody Screen: NEGATIVE

## 2015-09-02 LAB — RPR: RPR Ser Ql: NONREACTIVE

## 2015-09-02 MED ORDER — EPHEDRINE 5 MG/ML INJ: 10.0000 mg | INTRAVENOUS | Status: DC | PRN

## 2015-09-02 MED ORDER — ACETAMINOPHEN 325 MG PO TABS
650.0000 mg | ORAL_TABLET | ORAL | Status: DC | PRN
  Administered 2015-09-03: 650 mg via ORAL
  Filled 2015-09-02: qty 2

## 2015-09-02 MED ORDER — ONDANSETRON HCL 4 MG/2ML IJ SOLN
4.0000 mg | Freq: Four times a day (QID) | INTRAMUSCULAR | Status: DC | PRN
  Administered 2015-09-02: 4 mg via INTRAVENOUS
  Filled 2015-09-02 (×2): qty 2

## 2015-09-02 MED ORDER — OXYTOCIN 40 UNITS IN LACTATED RINGERS INFUSION - SIMPLE MED
1.0000 m[IU]/min | INTRAVENOUS | Status: DC
Start: 2015-09-02 — End: 2015-09-04
  Administered 2015-09-02: 2 m[IU]/min via INTRAVENOUS
  Filled 2015-09-02: qty 1000

## 2015-09-02 MED ORDER — LACTATED RINGERS IV SOLN
500.0000 mL | Freq: Once | INTRAVENOUS | Status: AC
  Administered 2015-09-03: 500 mL via INTRAVENOUS

## 2015-09-02 MED ORDER — LACTATED RINGERS IV SOLN
INTRAVENOUS | Status: DC
  Administered 2015-09-02 – 2015-09-03 (×6): via INTRAVENOUS

## 2015-09-02 MED ORDER — OXYTOCIN BOLUS FROM INFUSION: 500.0000 mL | INTRAVENOUS | Status: DC

## 2015-09-02 MED ORDER — PHENYLEPHRINE 40 MCG/ML (10ML) SYRINGE FOR IV PUSH (FOR BLOOD PRESSURE SUPPORT)
80.0000 ug | PREFILLED_SYRINGE | INTRAVENOUS | Status: DC | PRN
  Filled 2015-09-02: qty 10

## 2015-09-02 MED ORDER — BUTORPHANOL TARTRATE 1 MG/ML IJ SOLN
2.0000 mg | Freq: Once | INTRAMUSCULAR | Status: AC
  Administered 2015-09-02: 2 mg via INTRAVENOUS
  Filled 2015-09-02: qty 2

## 2015-09-02 MED ORDER — PROMETHAZINE HCL 25 MG/ML IJ SOLN
12.5000 mg | Freq: Once | INTRAMUSCULAR | Status: AC
  Administered 2015-09-02: 12.5 mg via INTRAVENOUS
  Filled 2015-09-02: qty 1

## 2015-09-02 MED ORDER — FENTANYL CITRATE (PF) 100 MCG/2ML IJ SOLN
100.0000 ug | INTRAMUSCULAR | Status: DC | PRN
  Administered 2015-09-02 – 2015-09-03 (×7): 100 ug via INTRAVENOUS
  Filled 2015-09-02 (×7): qty 2

## 2015-09-02 MED ORDER — PHENYLEPHRINE 40 MCG/ML (10ML) SYRINGE FOR IV PUSH (FOR BLOOD PRESSURE SUPPORT): 80.0000 ug | PREFILLED_SYRINGE | INTRAVENOUS | Status: DC | PRN

## 2015-09-02 MED ORDER — TERBUTALINE SULFATE 1 MG/ML IJ SOLN: 0.2500 mg | Freq: Once | INTRAMUSCULAR | Status: DC | PRN

## 2015-09-02 MED ORDER — SOD CITRATE-CITRIC ACID 500-334 MG/5ML PO SOLN
30.0000 mL | ORAL | Status: DC | PRN
  Administered 2015-09-02: 30 mL via ORAL
  Filled 2015-09-02 (×2): qty 15

## 2015-09-02 MED ORDER — MISOPROSTOL 25 MCG QUARTER TABLET
25.0000 ug | ORAL_TABLET | ORAL | Status: DC | PRN
  Administered 2015-09-02 (×2): 25 ug via VAGINAL
  Filled 2015-09-02 (×2): qty 0.25

## 2015-09-02 MED ORDER — OXYCODONE-ACETAMINOPHEN 5-325 MG PO TABS: 2.0000 | ORAL_TABLET | ORAL | Status: DC | PRN

## 2015-09-02 MED ORDER — LABETALOL HCL 100 MG PO TABS: 100.0000 mg | ORAL_TABLET | Freq: Two times a day (BID) | ORAL | Status: DC

## 2015-09-02 MED ORDER — OXYTOCIN 40 UNITS IN LACTATED RINGERS INFUSION - SIMPLE MED: 2.5000 [IU]/h | INTRAVENOUS | Status: DC

## 2015-09-02 MED ORDER — LIDOCAINE HCL (PF) 1 % IJ SOLN: 30.0000 mL | INTRAMUSCULAR | Status: DC | PRN

## 2015-09-02 MED ORDER — FENTANYL 2.5 MCG/ML BUPIVACAINE 1/10 % EPIDURAL INFUSION (WH - ANES)
14.0000 mL/h | INTRAMUSCULAR | Status: DC | PRN
  Administered 2015-09-03 – 2015-09-04 (×2): 14 mL/h via EPIDURAL
  Filled 2015-09-02 (×3): qty 125

## 2015-09-02 MED ORDER — LACTATED RINGERS IV SOLN
500.0000 mL | INTRAVENOUS | Status: DC | PRN
  Administered 2015-09-03 (×3): 500 mL via INTRAVENOUS
  Administered 2015-09-04: 300 mL via INTRAVENOUS

## 2015-09-02 MED ORDER — OXYCODONE-ACETAMINOPHEN 5-325 MG PO TABS: 1.0000 | ORAL_TABLET | ORAL | Status: DC | PRN

## 2015-09-02 MED ORDER — LABETALOL HCL 100 MG PO TABS
100.0000 mg | ORAL_TABLET | Freq: Two times a day (BID) | ORAL | Status: DC
  Administered 2015-09-02 – 2015-09-03 (×3): 100 mg via ORAL
  Filled 2015-09-02 (×4): qty 1

## 2015-09-02 MED ORDER — DIPHENHYDRAMINE HCL 50 MG/ML IJ SOLN: 12.5000 mg | INTRAMUSCULAR | Status: DC | PRN

## 2015-09-02 NOTE — Progress Notes (Signed)
LABOR PROGRESS NOTE  Christine Mueller is a 40 y.o. G2P0010 at 7735w0d  admitted for chtn and a2gdm  Subjective: Mod pain w/ ctxns  Objective: BP 154/95 mmHg  Pulse 83  Temp(Src) 98.8 F (37.1 C) (Oral)  Resp 18  Ht 5\' 3"  (1.6 m)  Wt 167 lb (75.751 kg)  BMI 29.59 kg/m2  LMP 12/03/2014 (Exact Date) or  Filed Vitals:   09/02/15 1557 09/02/15 1700 09/02/15 1754 09/02/15 1903  BP: 147/75  154/95   Pulse: 73  83   Temp: 98.8 F (37.1 C)     TempSrc: Oral     Resp: 20 18 20 18   Height:      Weight:        140/mod/-a.-d  Dilation: 3.5 Effacement (%): 80 Cervical Position: Posterior Station: -3 Presentation: Vertex Exam by:: N Deal RN  Labs: Lab Results  Component Value Date   WBC 6.8 09/02/2015   HGB 12.3 09/02/2015   HCT 37.4 09/02/2015   MCV 87.8 09/02/2015   PLT 248 09/02/2015    Patient Active Problem List   Diagnosis Date Noted  . Rh negative status during pregnancy 06/19/2015  . Gestational diabetes mellitus (GDM), antepartum 06/13/2015  . Supervision of high risk pregnancy, antepartum 05/29/2015  . COPD (chronic obstructive pulmonary disease) (HCC) 03/12/2015  . Asthma 03/12/2015  . Smoker 03/12/2015  . Advanced maternal age in multigravida   . Chronic hypertension during pregnancy, antepartum 02/14/2015    Assessment / Plan: 40 y.o. G2P0010 at 1335w0d here for iol for chtn and a2gdm  Labor: foley out, contracting frequenty, if no cervical change on hour will start pitocin Fetal Wellbeing:  Cat 1 Pain Control:  Iv narcotics Anticipated MOD:  Vag Chtn: no severe range pressures or features A2gdm: glucose wnl on q4 checks  Silvano BilisNoah B Margarita Bobrowski, MD 09/02/2015, 9:12 PM

## 2015-09-02 NOTE — Anesthesia Pain Management Evaluation Note (Signed)
  [facility] Pain Management Visit Note  Patient: Christine Mueller, 40 y.o., female  "Hello I am a member of the anesthesia team at [facility]. We have an anesthesia team available at all times to provide care throughout the hospital, including epidural management and anesthesia for C-section. I don't know your plan for the delivery whether it a natural birth, water birth, IV sedation, nitrous supplementation, doula or epidural, but we want to meet your pain goals."   1.Was your pain managed to your expectations on prior hospitalizations?     2.What is your expectation for pain management during this hospitalization?      3.How can we help you reach that goal?   Record the patient's initial score and the patient's pain goal.   Pain: 4  Pain Goal: 6 [facility] you to be able to say your pain was always managed very well.  Christine Mueller,Christine Mueller 09/02/2015

## 2015-09-02 NOTE — H&P (Signed)
Christine Mueller is a 40 y.o. female presenting for Induction of Labor for Chronic hypertension and GDM. Has been followed in the [facility] and has had good control of both HTN and GDM.  Is on low dose Labetalol and Gylburide 2.5mg  .EFW 72%ile on 08/21/15  Maternal Medical History:  Reason for admission: Nausea. IOL  Contractions: Frequency: irregular.   Perceived severity is mild.    Fetal activity: Perceived fetal activity is normal.   Last perceived fetal movement was within the past hour.    Prenatal complications: PIH.   No bleeding, infection, oligohydramnios, placental abnormality, pre-eclampsia or preterm labor.   Prenatal Complications - Diabetes: gestational. Diabetes is managed by oral agent (monotherapy).      OB History    Gravida Para Term Preterm AB TAB SAB Ectopic Multiple Living   0     Past Medical History  Diagnosis Date  . Hypertension   . COPD (chronic obstructive pulmonary disease) (HCC)   . Asthma   . Gestational diabetes     glyburide   Past Surgical History  Procedure Laterality Date  . Wisdom teeth removal     Family History: family history includes Diabetes in her mother; Hypertension in her mother. Social History:  reports that she has been smoking Cigarettes.  She has a 15 pack-year smoking history. She has never used smokeless tobacco. She reports that she does not drink alcohol or use illicit drugs.   Prenatal Transfer Tool  Maternal Diabetes: Yes:  Diabetes Type:  Insulin/Medication controlled Genetic Screening: Normal Maternal Ultrasounds/Referrals: Normal Fetal Ultrasounds or other Referrals:  None Maternal Substance Abuse:  No Significant Maternal Medications:  Meds include: Other:  Significant Maternal Lab Results:  None Other Comments:  On Labetalol and Glyburide  Review of Systems  Constitutional: Negative for fever, chills and malaise/fatigue.  Eyes: Negative for blurred vision and double vision.  Gastrointestinal:  Negative for nausea, vomiting, abdominal pain, diarrhea and constipation.  Genitourinary: Negative for dysuria.  Musculoskeletal: Negative for myalgias.  Neurological: Negative for dizziness, focal weakness, weakness and headaches.      Blood pressure 135/84, pulse 98, temperature 97.3 F (36.3 C), temperature source Axillary, resp. rate 18, height  (1.6 m), weight 167 lb (75.751 kg), last menstrual period 12/03/2014, unknown if currently breastfeeding. Maternal Exam:  Uterine Assessment: Contraction strength is mild.  Contraction frequency is irregular.   Abdomen: Patient reports no abdominal tenderness. Fundal height is 38.   Estimated fetal weight is 7.   Fetal presentation: vertex  Introitus: Ferning test: not done.  Nitrazine test: not done. Amniotic fluid character: not assessed.     Fetal Exam Fetal Monitor Review: Mode: ultrasound.   Baseline rate: 140.  Variability: moderate (6-25 bpm).   Pattern: accelerations present and no decelerations.    Fetal State Assessment: Category I - tracings are normal.     Physical Exam  Constitutional: She is oriented to person, place, and time. She appears well-developed and well-nourished. No distress.  HENT:  Head: Normocephalic.  Neck: Normal range of motion.  Cardiovascular: Normal rate and regular rhythm.  Exam reveals no gallop and no friction rub.   No murmur heard. Respiratory: Effort normal and breath sounds normal. No respiratory distress.  GI: Soft. She exhibits no distension and no mass. There is no tenderness. There is no rebound and no guarding.  Musculoskeletal: Normal range of motion. She exhibits no edema.  Neurological: She is alert and oriented to  person, place, and time. She has normal reflexes. She exhibits normal muscle tone.  Skin: Skin is warm and dry.  Psychiatric: She has a normal mood and affect.    Prenatal labs: ABO, Rh: B/NEG/-- (11/30 1611) Antibody: NEG (11/30 1611) Rubella: 11.90 (11/30  1611) RPR: NON REAC (04/20 1037)  HBsAg: NEGATIVE (11/30 1611)  HIV: NONREACTIVE (04/20 1037)  GBS: Negative (06/19 0000)   Assessment/Plan: SIUP at 7131w0d  Chronic hypertension Gestational Diabetes  [facility] Routine orders IOL, plan Foley then Pitocin Anticipate SVD   [facility] CenterWILLIAMS,Nyoka Alcoser 09/02/2015, 7:57 AM    ha

## 2015-09-02 NOTE — Progress Notes (Signed)
LABOR PROGRESS NOTE  Christine Mueller is a 40 y.o. G2P0010 at 3552w0d  admitted for chtn, a2gdm  Subjective: Mild cramping  Objective: BP 127/76 mmHg  Pulse 87  Temp(Src) 97.3 F (36.3 C) (Axillary)  Resp 20  Ht 5\' 3"  (1.6 m)  Wt 167 lb (75.751 kg)  BMI 29.59 kg/m2  LMP 12/03/2014 (Exact Date) or  Filed Vitals:   09/02/15 0736 09/02/15 0900 09/02/15 1000 09/02/15 1100  BP: 135/84 127/76    Pulse: 98 87    Temp: 97.3 F (36.3 C)     TempSrc: Axillary     Resp: 18 18 18 20   Height: 5\' 3"  (1.6 m)     Weight: 167 lb (75.751 kg)       140/mod/+a/-d  Dilation: 1.5 Effacement (%): 80 Cervical Position: Posterior Station: -3 Presentation: Vertex Exam by:: Dr. Ashok PallWouk  Labs: Lab Results  Component Value Date   WBC 6.8 09/02/2015   HGB 12.3 09/02/2015   HCT 37.4 09/02/2015   MCV 87.8 09/02/2015   PLT 248 09/02/2015    Patient Active Problem List   Diagnosis Date Noted  . Rh negative status during pregnancy 06/19/2015  . Gestational diabetes mellitus (GDM), antepartum 06/13/2015  . Supervision of high risk pregnancy, antepartum 05/29/2015  . COPD (chronic obstructive pulmonary disease) (HCC) 03/12/2015  . Asthma 03/12/2015  . Smoker 03/12/2015  . Advanced maternal age in multigravida   . Chronic hypertension during pregnancy, antepartum 02/14/2015    Assessment / Plan: 40 y.o. G2P0010 at 3452w0d here for iol for the above  Labor: cytotec at 09:00, foley just now, continue cytotec q 4 hrs for ripening Fetal Wellbeing:  Cat 1 Pain Control:  Iv fentanyl Anticipated MOD:  Vaginal Chtn: no signs/symptoms severe disease A2gdm: glucose wnl  Christine BilisNoah B Janee Ureste, MD 09/02/2015, 11:48 AM

## 2015-09-02 NOTE — Progress Notes (Signed)
LABOR PROGRESS NOTE  Christine Mueller is a 40 y.o. G2P0010 at 4968w0d  admitted for IOL for cHTN.  Subjective: Pt sleeping.  Objective: BP 154/95 mmHg  Pulse 83  Temp(Src) 98.8 F (37.1 C) (Oral)  Resp 20  Ht 5\' 3"  (1.6 m)  Wt 75.751 kg (167 lb)  BMI 29.59 kg/m2  LMP 12/03/2014 (Exact Date) or  Filed Vitals:   09/02/15 1500 09/02/15 1557 09/02/15 1700 09/02/15 1754  BP: 128/76 147/75  154/95  Pulse: 85 73  83  Temp:  98.8 F (37.1 C)    TempSrc:  Oral    Resp: 18 20 18 20   Height:      Weight:         Dilation: 1.5 Effacement (%): 80 Cervical Position: Posterior Station: -3 Presentation: Vertex Exam by:: Lorretta Harp. Brown RNC  Labs: Lab Results  Component Value Date   WBC 6.8 09/02/2015   HGB 12.3 09/02/2015   HCT 37.4 09/02/2015   MCV 87.8 09/02/2015   PLT 248 09/02/2015    Patient Active Problem List   Diagnosis Date Noted  . Rh negative status during pregnancy 06/19/2015  . Gestational diabetes mellitus (GDM), antepartum 06/13/2015  . Supervision of high risk pregnancy, antepartum 05/29/2015  . COPD (chronic obstructive pulmonary disease) (HCC) 03/12/2015  . Asthma 03/12/2015  . Smoker 03/12/2015  . Advanced maternal age in multigravida   . Chronic hypertension during pregnancy, antepartum 02/14/2015    Assessment / Plan: 40 y.o. G2P0010 at 1468w0d here for IOL for cHTN.  Labor: s/p cytotec, deferring cytotec for now due to 7/10 pain contractions Fetal Wellbeing:  Cat 1 Pain Control:  S/p fentanyl and stadol  Anticipated MOD:  SVD  Loni MuseKate Timberlake, MD 09/02/2015, 6:18 PM

## 2015-09-03 ENCOUNTER — Encounter (HOSPITAL_COMMUNITY): Payer: Self-pay

## 2015-09-03 ENCOUNTER — Inpatient Hospital Stay (HOSPITAL_COMMUNITY): Payer: Medicaid Other | Admitting: Anesthesiology

## 2015-09-03 LAB — CBC
HCT: 35.6 % — ABNORMAL LOW (ref 36.0–46.0)
Hemoglobin: 11.6 g/dL — ABNORMAL LOW (ref 12.0–15.0)
MCH: 28.7 pg (ref 26.0–34.0)
MCHC: 32.6 g/dL (ref 30.0–36.0)
MCV: 88.1 fL (ref 78.0–100.0)
PLATELETS: 207 10*3/uL (ref 150–400)
RBC: 4.04 MIL/uL (ref 3.87–5.11)
RDW: 14.8 % (ref 11.5–15.5)
WBC: 9.6 10*3/uL (ref 4.0–10.5)

## 2015-09-03 LAB — GLUCOSE, CAPILLARY
GLUCOSE-CAPILLARY: 81 mg/dL (ref 65–99)
Glucose-Capillary: 96 mg/dL (ref 65–99)
Glucose-Capillary: 88 mg/dL (ref 65–99)
Glucose-Capillary: 91 mg/dL (ref 65–99)
Glucose-Capillary: 68 mg/dL (ref 65–99)
Glucose-Capillary: 79 mg/dL (ref 65–99)

## 2015-09-03 LAB — CCBB MATERNAL DONOR DRAW

## 2015-09-03 MED ORDER — GENTAMICIN SULFATE 40 MG/ML IJ SOLN
Freq: Three times a day (TID) | INTRAVENOUS | Status: DC
  Administered 2015-09-03: via INTRAVENOUS
  Filled 2015-09-03 (×2): qty 3

## 2015-09-03 MED ORDER — PROMETHAZINE HCL 25 MG/ML IJ SOLN
12.5000 mg | Freq: Four times a day (QID) | INTRAMUSCULAR | Status: DC | PRN
  Administered 2015-09-03: 12.5 mg via INTRAVENOUS
  Filled 2015-09-03: qty 1

## 2015-09-03 MED ORDER — GENTAMICIN SULFATE 40 MG/ML IJ SOLN: 1.5000 mg/kg | Freq: Three times a day (TID) | INTRAVENOUS | Status: DC

## 2015-09-03 MED ORDER — CLINDAMYCIN PHOSPHATE 900 MG/50ML IV SOLN: 900.0000 mg | Freq: Three times a day (TID) | INTRAVENOUS | Status: DC

## 2015-09-03 MED ORDER — LIDOCAINE HCL (PF) 1 % IJ SOLN
INTRAMUSCULAR | Status: DC | PRN
  Administered 2015-09-03 (×2): 4 mL via EPIDURAL

## 2015-09-03 MED ORDER — SODIUM CHLORIDE 0.9 % IV SOLN: INTRAVENOUS | Status: AC

## 2015-09-03 NOTE — Progress Notes (Signed)
Patient ID: Christine Mueller, female   DOB: [birth year], 40 y.o.   MRN: [id number]  Comfortable w/ epidural BP 148/83, T 101.1 FHR 170s, min variability, occ mi variables MVUs adequate Cx 7-7.5/80/-2  Protracted active phase CHTN/A2GDM Triple I  Will give Tylenol and start Gent/Clinda Dr Despina HiddenEure coming to eval  Christine Mueller, Christine Mueller CNM 09/03/2015 11:33 PM

## 2015-09-03 NOTE — Anesthesia Pain Management Evaluation Note (Signed)
  CRNA Pain Management Visit Note  Patient: Christine Mueller, 40 y.o., female  "Hello [facility] am a [facility]. We have an anesthesia team available at all times to provide care throughout the hospital, including epidural management and anesthesia for C-section. I don't know your plan for the delivery whether it a natural birth, water birth, IV sedation, nitrous supplementation, doula or epidural, but we want to meet your pain goals."   1.Was your pain managed to your expectations on prior hospitalizations?   Unable to assess - patient sleeping  2.What is your expectation for pain management during this hospitalization?     Epidural  3.How can we help you reach that goal?  Record the patient's initial score and the patient's pain goal.   Pain: Patient sleeping - unable to assess  Pain Goal: Patient sleeping - unable to assess [facility] wants you to be able to say your pain was always managed very well.  [facility]EIGHT,Leroi Haque 09/03/2015

## 2015-09-03 NOTE — Progress Notes (Signed)
Patient ID: Christine Mueller, female   DOB: 1975-07-28, 40 y.o.   MRN: [id number]  Pt comfortable w/ epidural; SROM@1420   VSS, BP 136/95, 127/73 FHR 150s, min variability Ctx q 3-5 mins, MVUs 160-200 Cx 7/80/-2  IUP@term  Active labor cHTN A2GDM  Check cx in 2 hrs or sooner prn pressure  Mayda Shippee CNM 09/03/2015 10:14 PM

## 2015-09-03 NOTE — Progress Notes (Signed)
LABOR PROGRESS NOTE  Christine Mueller is a 40 y.o. G2P0010 at 4573w0d  admitted for chtn and a2gdm  Subjective: Mod pain w/ ctxns  Objective: BP 141/81 mmHg  Pulse 84  Temp(Src) 98.4 F (36.9 C) (Oral)  Resp 20  Ht 5\' 3"  (1.6 m)  Wt 167 lb (75.751 kg)  BMI 29.59 kg/m2  LMP 12/03/2014 (Exact Date) or  Filed Vitals:   09/03/15 0250 09/03/15 0354 09/03/15 0355 09/03/15 0535  BP:  139/74 139/74 141/81  Pulse:  80 80 84  Temp: 98.2 F (36.8 C)   98.4 F (36.9 C)  TempSrc: Oral   Oral  Resp:    20  Height:      Weight:        140/mod/-a.-d  Dilation: 4.5 Effacement (%): 50 Cervical Position: Posterior Station: -2 Presentation: Vertex Exam by:: Dr. Ashok PallWouk   Labs: Lab Results  Component Value Date   WBC 6.8 09/02/2015   HGB 12.3 09/02/2015   HCT 37.4 09/02/2015   MCV 87.8 09/02/2015   PLT 248 09/02/2015    Patient Active Problem List   Diagnosis Date Noted  . Rh negative status during pregnancy 06/19/2015  . Gestational diabetes mellitus (GDM), antepartum 06/13/2015  . Supervision of high risk pregnancy, antepartum 05/29/2015  . COPD (chronic obstructive pulmonary disease) (HCC) 03/12/2015  . Asthma 03/12/2015  . Smoker 03/12/2015  . Advanced maternal age in multigravida   . Chronic hypertension during pregnancy, antepartum 02/14/2015    Assessment / Plan: 40 y.o. G2P0010 at 3773w0d here for iol for chtn and a2gdm  Labor: cervix still thick, will have nursing more aggressively uptitrate pitocin and attempt arom later today Fetal Wellbeing:  Cat 1 Pain Control:  Iv narcotics Anticipated MOD:  Vag Chtn: no severe range pressures or features A2gdm: glucose wnl on q4 checks  Silvano BilisNoah B Sullivan Blasing, MD 09/03/2015, 6:39 AM

## 2015-09-03 NOTE — Anesthesia Pain Management Evaluation Note (Signed)
  CRNA Pain Management Visit Note  Patient: Christine Mueller, 40 y.o., female  "Hello [facility]. We have an anesthesia team available at all times to provide care throughout the hospital, including epidural management and anesthesia for C-section. I don't know your plan for the delivery whether it a natural birth, water birth, IV sedation, nitrous supplementation, doula or epidural, but we want to meet your pain goals."   1.Was your pain managed to your expectations on prior hospitalizations?   No prior hospitalizations  2.What is your expectation for pain management during this hospitalization?     Epidural and IV pain meds  3.How can we help you reach that goal? Iv pain meds and epidural when patient is ready  Record the patient's initial score and the patient's pain goal.   Pain: 10  Pain Goal: 3 [facility] wants you to be able to say your pain was always managed very well.  Ennio Houp 09/03/2015

## 2015-09-03 NOTE — Progress Notes (Signed)
LABOR PROGRESS NOTE  Christine Mueller is a 40 y.o. G2P0010 at 4269w1d  admitted for IOL for cHTN.  Subjective: Pt resting comfortably.  Objective: BP 136/85 mmHg  Pulse 83  Temp(Src) 98.4 F (36.9 C) (Oral)  Resp 18  Ht 5\' 3"  (1.6 m)  Wt 75.751 kg (167 lb)  BMI 29.59 kg/m2  SpO2 98%  LMP 12/03/2014 (Exact Date) or  Filed Vitals:   09/03/15 1430 09/03/15 1501 09/03/15 1519 09/03/15 1530  BP: 144/79 166/83 131/80 136/85  Pulse: 79 95 80 83  Temp: 98.4 F (36.9 C)     TempSrc: Oral     Resp: 18 16 16 18   Height:      Weight:      SpO2:        Dilation: 5.5 Effacement (%): 70 Cervical Position: Posterior Station: -1 Presentation: Vertex Exam by:: Dr.Timberlake  Labs: Lab Results  Component Value Date   WBC 9.6 09/03/2015   HGB 11.6* 09/03/2015   HCT 35.6* 09/03/2015   MCV 88.1 09/03/2015   PLT 207 09/03/2015    Patient Active Problem List   Diagnosis Date Noted  . Rh negative status during pregnancy 06/19/2015  . Gestational diabetes mellitus (GDM), antepartum 06/13/2015  . Supervision of high risk pregnancy, antepartum 05/29/2015  . COPD (chronic obstructive pulmonary disease) (HCC) 03/12/2015  . Asthma 03/12/2015  . Smoker 03/12/2015  . Advanced maternal age in multigravida   . Chronic hypertension during pregnancy, antepartum 02/14/2015    Assessment / Plan: 40 y.o. G2P0010 at 9869w1d here for IOL for cHTN.  Labor: no cervical change in 2 checks, placed IUPC and will titrate pit Fetal Wellbeing:  cat1 Pain Control:  S/p epidural Anticipated MOD:  SVD  Loni MuseKate Timberlake, MD 09/03/2015, 4:29 PM

## 2015-09-03 NOTE — Anesthesia Procedure Notes (Signed)
Epidural Patient location during procedure: OB Start time: 09/03/2015 10:13 AM  Staffing Anesthesiologist: Mal AmabileFOSTER, Blong Busk Performed by: anesthesiologist   Preanesthetic Checklist Completed: patient identified, site marked, surgical consent, pre-op evaluation, timeout performed, IV checked, risks and benefits discussed and monitors and equipment checked  Epidural Patient position: sitting Prep: site prepped and draped and DuraPrep Patient monitoring: continuous pulse ox and blood pressure Approach: midline Location: L4-L5 Injection technique: LOR air  Needle:  Needle type: Tuohy  Needle gauge: 17 G Needle length: 9 cm and 9 Needle insertion depth: 6 cm Catheter type: closed end flexible Catheter size: 19 Gauge Catheter at skin depth: 11 cm Test dose: negative and Other  Assessment Events: blood not aspirated, injection not painful, no injection resistance, negative IV test and no paresthesia  Additional Notes Patient identified. Risks and benefits discussed including failed block, incomplete  Pain control, post dural puncture headache, nerve damage, paralysis, blood pressure Changes, nausea, vomiting, reactions to medications-both toxic and allergic and post Partum back pain. All questions were answered. Patient expressed understanding and wished to proceed. Sterile technique was used throughout procedure. Epidural site was Dressed with sterile barrier dressing. No paresthesias, signs of intravascular injection Or signs of intrathecal spread were encountered.  Patient was more comfortable after the epidural was dosed. Please see RN's note for documentation of vital signs and FHR which are stable.

## 2015-09-03 NOTE — Progress Notes (Signed)
LABOR PROGRESS NOTE  Christine Mueller is a 40 y.o. G2P0010 at 3030w1d  admitted for IOL for cHTN.   Subjective: Pt feeling a little nauseous.  Objective: BP 138/79 mmHg  Pulse 87  Temp(Src) 98.9 F (37.2 C) (Oral)  Resp 16  Ht 5\' 3"  (1.6 m)  Wt 75.751 kg (167 lb)  BMI 29.59 kg/m2  SpO2 98%  LMP 12/03/2014 (Exact Date) or  Filed Vitals:   09/03/15 1631 09/03/15 1700 09/03/15 1730 09/03/15 1800  BP: 138/85 131/79 142/77 138/79  Pulse: 83 88 79 87  Temp: 98.9 F (37.2 C)     TempSrc: Oral     Resp: 16 18 16 16   Height:      Weight:      SpO2:        Dilation: 5.5 Effacement (%): 70 Cervical Position: Posterior Station: -1 Presentation: Vertex Exam by:: Dr.Kiwan Gadsden  Labs: Lab Results  Component Value Date   WBC 9.6 09/03/2015   HGB 11.6* 09/03/2015   HCT 35.6* 09/03/2015   MCV 88.1 09/03/2015   PLT 207 09/03/2015    Patient Active Problem List   Diagnosis Date Noted  . Rh negative status during pregnancy 06/19/2015  . Gestational diabetes mellitus (GDM), antepartum 06/13/2015  . Supervision of high risk pregnancy, antepartum 05/29/2015  . COPD (chronic obstructive pulmonary disease) (HCC) 03/12/2015  . Asthma 03/12/2015  . Smoker 03/12/2015  . Advanced maternal age in multigravida   . Chronic hypertension during pregnancy, antepartum 02/14/2015    Assessment / Plan: 40 y.o. G2P0010 at 8530w1d here for IOL for cHTN.  Labor: s/p IUPC replacement Fetal Wellbeing:  Cat 1 Pain Control:  S/p epidural, well controlled Anticipated MOD:  SVD  Loni MuseKate Jamarkis Branam, MD 09/03/2015, 6:45 PM

## 2015-09-03 NOTE — Anesthesia Preprocedure Evaluation (Signed)
Anesthesia Evaluation  Patient identified by MRN, date of birth, ID band Patient awake    Reviewed: NPO status , Patient's Chart, lab work & pertinent test results, reviewed documented beta blocker date and time   Airway Mallampati: III  TM Distance: >3 FB Neck ROM: Full    Dental no notable dental hx. (+) Teeth Intact   Pulmonary asthma , COPD, Current Smoker,    Pulmonary exam normal breath sounds clear to auscultation       Cardiovascular hypertension, Pt. on medications and Pt. on home beta blockers Normal cardiovascular exam Rhythm:Regular Rate:Normal     Neuro/Psych negative neurological ROS  negative psych ROS   GI/Hepatic Neg liver ROS, GERD  Medicated and Controlled,  Endo/Other  diabetes, Well Controlled, Gestational, Oral Hypoglycemic Agents  Renal/GU negative Renal ROS  negative genitourinary   Musculoskeletal negative musculoskeletal ROS (+)   Abdominal (+) + obese,   Peds  Hematology  (+) anemia ,   Anesthesia Other Findings   Reproductive/Obstetrics (+) Pregnancy Pre eclampsia                             Anesthesia Physical Anesthesia Plan  ASA: III  Anesthesia Plan: Epidural   Post-op Pain Management:    Induction:   Airway Management Planned: Natural Airway  Additional Equipment:   Intra-op Plan:   Post-operative Plan:   Informed Consent: I have reviewed the patients History and Physical, chart, labs and discussed the procedure including the risks, benefits and alternatives for the proposed anesthesia with the patient or authorized representative who has indicated his/her understanding and acceptance.     Plan Discussed with: Anesthesiologist  Anesthesia Plan Comments:         Anesthesia Quick Evaluation

## 2015-09-04 ENCOUNTER — Encounter (HOSPITAL_COMMUNITY): Payer: Self-pay

## 2015-09-04 ENCOUNTER — Encounter (HOSPITAL_COMMUNITY)
Admission: RE | Disposition: A | Discharge status: Home / Self Care | Payer: Self-pay | Source: Ambulatory Visit | Attending: Obstetrics & Gynecology

## 2015-09-04 DIAGNOSIS — O24429 Gestational diabetes mellitus in childbirth, unspecified control: Secondary | ICD-10-CM

## 2015-09-04 DIAGNOSIS — O324XX0 Maternal care for high head at term, not applicable or unspecified: Secondary | ICD-10-CM

## 2015-09-04 DIAGNOSIS — Z3A39 39 weeks gestation of pregnancy: Secondary | ICD-10-CM

## 2015-09-04 DIAGNOSIS — O1092 Unspecified pre-existing hypertension complicating childbirth: Secondary | ICD-10-CM

## 2015-09-04 DIAGNOSIS — Z98891 History of uterine scar from previous surgery: Secondary | ICD-10-CM

## 2015-09-04 LAB — CBC
HEMATOCRIT: 33.3 % — AB (ref 36.0–46.0)
HEMOGLOBIN: 10.9 g/dL — AB (ref 12.0–15.0)
MCH: 28.7 pg (ref 26.0–34.0)
MCHC: 32.7 g/dL (ref 30.0–36.0)
MCV: 87.6 fL (ref 78.0–100.0)
Platelets: 162 10*3/uL (ref 150–400)
RBC: 3.8 MIL/uL — AB (ref 3.87–5.11)
RDW: 15 % (ref 11.5–15.5)
WBC: 19.1 10*3/uL — AB (ref 4.0–10.5)

## 2015-09-04 LAB — GLUCOSE, CAPILLARY
GLUCOSE-CAPILLARY: 87 mg/dL (ref 65–99)
Glucose-Capillary: 90 mg/dL (ref 65–99)

## 2015-09-04 SURGERY — Surgical Case
Anesthesia: Epidural

## 2015-09-04 MED ORDER — FAMOTIDINE 20 MG PO TABS
20.0000 mg | ORAL_TABLET | Freq: Every day | ORAL | Status: DC
  Administered 2015-09-04 – 2015-09-05 (×2): 20 mg via ORAL
  Filled 2015-09-04 (×2): qty 1

## 2015-09-04 MED ORDER — MEPERIDINE HCL 25 MG/ML IJ SOLN: 6.2500 mg | INTRAMUSCULAR | Status: DC | PRN

## 2015-09-04 MED ORDER — LACTATED RINGERS IV SOLN: INTRAVENOUS | Status: DC

## 2015-09-04 MED ORDER — SCOPOLAMINE 1 MG/3DAYS TD PT72
MEDICATED_PATCH | TRANSDERMAL | Status: AC
  Filled 2015-09-04: qty 1

## 2015-09-04 MED ORDER — NALBUPHINE HCL 10 MG/ML IJ SOLN: 5.0000 mg | Freq: Once | INTRAMUSCULAR | Status: DC | PRN

## 2015-09-04 MED ORDER — SCOPOLAMINE 1 MG/3DAYS TD PT72: 1.0000 | MEDICATED_PATCH | Freq: Once | TRANSDERMAL | Status: DC

## 2015-09-04 MED ORDER — DIBUCAINE 1 % RE OINT: 1.0000 "application " | TOPICAL_OINTMENT | RECTAL | Status: DC | PRN

## 2015-09-04 MED ORDER — SODIUM BICARBONATE 8.4 % IV SOLN
INTRAVENOUS | Status: AC
  Filled 2015-09-04: qty 50

## 2015-09-04 MED ORDER — MENTHOL 3 MG MT LOZG: 1.0000 | LOZENGE | OROMUCOSAL | Status: DC | PRN

## 2015-09-04 MED ORDER — IBUPROFEN 600 MG PO TABS
600.0000 mg | ORAL_TABLET | Freq: Four times a day (QID) | ORAL | Status: DC
  Administered 2015-09-04 – 2015-09-06 (×8): 600 mg via ORAL
  Filled 2015-09-04 (×9): qty 1

## 2015-09-04 MED ORDER — COCONUT OIL OIL: 1.0000 "application " | TOPICAL_OIL | Status: DC | PRN

## 2015-09-04 MED ORDER — FENTANYL CITRATE (PF) 100 MCG/2ML IJ SOLN: 25.0000 ug | INTRAMUSCULAR | Status: DC | PRN

## 2015-09-04 MED ORDER — MORPHINE SULFATE (PF) 0.5 MG/ML IJ SOLN
INTRAMUSCULAR | Status: DC | PRN
  Administered 2015-09-04: 4 mg via EPIDURAL

## 2015-09-04 MED ORDER — ONDANSETRON HCL 4 MG/2ML IJ SOLN
INTRAMUSCULAR | Status: DC | PRN
  Administered 2015-09-04: 4 mg via INTRAVENOUS

## 2015-09-04 MED ORDER — TETANUS-DIPHTH-ACELL PERTUSSIS 5-2.5-18.5 LF-MCG/0.5 IM SUSP: 0.5000 mL | Freq: Once | INTRAMUSCULAR | Status: DC

## 2015-09-04 MED ORDER — SODIUM CHLORIDE 0.9% FLUSH: 3.0000 mL | INTRAVENOUS | Status: DC | PRN

## 2015-09-04 MED ORDER — SIMETHICONE 80 MG PO CHEW
80.0000 mg | CHEWABLE_TABLET | Freq: Three times a day (TID) | ORAL | Status: DC
  Administered 2015-09-04 – 2015-09-06 (×7): 80 mg via ORAL
  Filled 2015-09-04 (×7): qty 1

## 2015-09-04 MED ORDER — SODIUM BICARBONATE 8.4 % IV SOLN
INTRAVENOUS | Status: DC | PRN
  Administered 2015-09-04: 5 mL via EPIDURAL
  Administered 2015-09-04: 2 mL via EPIDURAL

## 2015-09-04 MED ORDER — OXYTOCIN 10 UNIT/ML IJ SOLN
INTRAMUSCULAR | Status: AC
  Filled 2015-09-04: qty 4

## 2015-09-04 MED ORDER — LACTATED RINGERS IV SOLN
INTRAVENOUS | Status: DC
  Administered 2015-09-04: 15:00:00 via INTRAVENOUS

## 2015-09-04 MED ORDER — DEXTROSE 5 % IV SOLN
2.0000 g | Freq: Four times a day (QID) | INTRAVENOUS | Status: AC
  Administered 2015-09-04 – 2015-09-05 (×4): 2 g via INTRAVENOUS
  Filled 2015-09-04 (×4): qty 2

## 2015-09-04 MED ORDER — ONDANSETRON HCL 4 MG/2ML IJ SOLN
4.0000 mg | Freq: Three times a day (TID) | INTRAMUSCULAR | Status: DC | PRN
Start: 2015-09-04 — End: 2015-09-06

## 2015-09-04 MED ORDER — CEFAZOLIN SODIUM-DEXTROSE 2-4 GM/100ML-% IV SOLN: 2.0000 g | Freq: Once | INTRAVENOUS | Status: DC

## 2015-09-04 MED ORDER — NALBUPHINE HCL 10 MG/ML IJ SOLN: 5.0000 mg | INTRAMUSCULAR | Status: DC | PRN

## 2015-09-04 MED ORDER — SCOPOLAMINE 1 MG/3DAYS TD PT72
MEDICATED_PATCH | TRANSDERMAL | Status: DC | PRN
  Administered 2015-09-04: 1 via TRANSDERMAL

## 2015-09-04 MED ORDER — DIPHENHYDRAMINE HCL 25 MG PO CAPS
25.0000 mg | ORAL_CAPSULE | ORAL | Status: DC | PRN
Start: 2015-09-04 — End: 2015-09-06
  Filled 2015-09-04: qty 1

## 2015-09-04 MED ORDER — ALBUTEROL SULFATE (2.5 MG/3ML) 0.083% IN NEBU: 3.0000 mL | INHALATION_SOLUTION | Freq: Four times a day (QID) | RESPIRATORY_TRACT | Status: DC | PRN

## 2015-09-04 MED ORDER — METHYLERGONOVINE MALEATE 0.2 MG PO TABS: 0.2000 mg | ORAL_TABLET | ORAL | Status: DC | PRN

## 2015-09-04 MED ORDER — PRENATAL MULTIVITAMIN CH
1.0000 | ORAL_TABLET | Freq: Every day | ORAL | Status: DC
  Administered 2015-09-05: 1 via ORAL
  Filled 2015-09-04: qty 1

## 2015-09-04 MED ORDER — WITCH HAZEL-GLYCERIN EX PADS: 1.0000 "application " | MEDICATED_PAD | CUTANEOUS | Status: DC | PRN

## 2015-09-04 MED ORDER — LIDOCAINE-EPINEPHRINE (PF) 2 %-1:200000 IJ SOLN
INTRAMUSCULAR | Status: AC
  Filled 2015-09-04: qty 20

## 2015-09-04 MED ORDER — SODIUM CHLORIDE 0.9 % IR SOLN
Status: DC | PRN
Start: 2015-09-04 — End: 2015-09-04
  Administered 2015-09-04: 1

## 2015-09-04 MED ORDER — ACETAMINOPHEN 325 MG PO TABS: 650.0000 mg | ORAL_TABLET | ORAL | Status: DC | PRN

## 2015-09-04 MED ORDER — LACTATED RINGERS IV SOLN
INTRAVENOUS | Status: DC | PRN
  Administered 2015-09-04 (×3): via INTRAVENOUS

## 2015-09-04 MED ORDER — CEFAZOLIN SODIUM-DEXTROSE 2-3 GM-% IV SOLR
INTRAVENOUS | Status: DC | PRN
  Administered 2015-09-04: 2 g via INTRAVENOUS

## 2015-09-04 MED ORDER — SIMETHICONE 80 MG PO CHEW: 80.0000 mg | CHEWABLE_TABLET | ORAL | Status: DC | PRN

## 2015-09-04 MED ORDER — DIPHENHYDRAMINE HCL 25 MG PO CAPS: 25.0000 mg | ORAL_CAPSULE | Freq: Four times a day (QID) | ORAL | Status: DC | PRN

## 2015-09-04 MED ORDER — OXYTOCIN 10 UNIT/ML IJ SOLN
40.0000 [IU] | INTRAVENOUS | Status: DC | PRN
  Administered 2015-09-04: 40 [IU] via INTRAVENOUS

## 2015-09-04 MED ORDER — ZOLPIDEM TARTRATE 5 MG PO TABS: 5.0000 mg | ORAL_TABLET | Freq: Every evening | ORAL | Status: DC | PRN

## 2015-09-04 MED ORDER — CEFAZOLIN SODIUM-DEXTROSE 2-4 GM/100ML-% IV SOLN
INTRAVENOUS | Status: AC
  Filled 2015-09-04: qty 100

## 2015-09-04 MED ORDER — METHYLERGONOVINE MALEATE 0.2 MG/ML IJ SOLN: 0.2000 mg | INTRAMUSCULAR | Status: DC | PRN

## 2015-09-04 MED ORDER — PROMETHAZINE HCL 25 MG/ML IJ SOLN: 6.2500 mg | INTRAMUSCULAR | Status: DC | PRN

## 2015-09-04 MED ORDER — NALOXONE HCL 0.4 MG/ML IJ SOLN: 0.4000 mg | INTRAMUSCULAR | Status: DC | PRN

## 2015-09-04 MED ORDER — KETOROLAC TROMETHAMINE 30 MG/ML IJ SOLN
30.0000 mg | Freq: Four times a day (QID) | INTRAMUSCULAR | Status: AC | PRN
Start: 2015-09-04 — End: 2015-09-05
  Administered 2015-09-04: 30 mg via INTRAMUSCULAR

## 2015-09-04 MED ORDER — LACTATED RINGERS IV SOLN
INTRAVENOUS | Status: DC | PRN
  Administered 2015-09-04: 04:00:00 via INTRAVENOUS

## 2015-09-04 MED ORDER — KETOROLAC TROMETHAMINE 30 MG/ML IJ SOLN
INTRAMUSCULAR | Status: AC
  Filled 2015-09-04: qty 1

## 2015-09-04 MED ORDER — OXYTOCIN 40 UNITS IN LACTATED RINGERS INFUSION - SIMPLE MED: 2.5000 [IU]/h | INTRAVENOUS | Status: AC

## 2015-09-04 MED ORDER — NALOXONE HCL 2 MG/2ML IJ SOSY
1.0000 ug/kg/h | PREFILLED_SYRINGE | INTRAVENOUS | Status: DC | PRN
  Filled 2015-09-04: qty 2

## 2015-09-04 MED ORDER — ONDANSETRON HCL 4 MG/2ML IJ SOLN
INTRAMUSCULAR | Status: AC
  Filled 2015-09-04: qty 2

## 2015-09-04 MED ORDER — DIPHENHYDRAMINE HCL 50 MG/ML IJ SOLN: 12.5000 mg | INTRAMUSCULAR | Status: DC | PRN

## 2015-09-04 MED ORDER — PNEUMOCOCCAL VAC POLYVALENT 25 MCG/0.5ML IJ INJ
0.5000 mL | INJECTION | INTRAMUSCULAR | Status: AC
  Administered 2015-09-05: 0.5 mL via INTRAMUSCULAR
  Filled 2015-09-04 (×2): qty 0.5

## 2015-09-04 MED ORDER — KETOROLAC TROMETHAMINE 30 MG/ML IJ SOLN
30.0000 mg | Freq: Four times a day (QID) | INTRAMUSCULAR | Status: AC | PRN
Start: 2015-09-04 — End: 2015-09-05

## 2015-09-04 MED ORDER — NICOTINE 21 MG/24HR TD PT24
21.0000 mg | MEDICATED_PATCH | Freq: Every day | TRANSDERMAL | Status: DC
  Administered 2015-09-04: 21 mg via TRANSDERMAL
  Filled 2015-09-04 (×3): qty 1

## 2015-09-04 MED ORDER — SENNOSIDES-DOCUSATE SODIUM 8.6-50 MG PO TABS
2.0000 | ORAL_TABLET | ORAL | Status: DC
  Administered 2015-09-05 – 2015-09-06 (×2): 2 via ORAL
  Filled 2015-09-04 (×2): qty 2

## 2015-09-04 MED ORDER — SIMETHICONE 80 MG PO CHEW
80.0000 mg | CHEWABLE_TABLET | ORAL | Status: DC
  Administered 2015-09-05 – 2015-09-06 (×2): 80 mg via ORAL
  Filled 2015-09-04 (×2): qty 1

## 2015-09-04 MED ORDER — PRENATAL MULTIVITAMIN CH
1.0000 | ORAL_TABLET | Freq: Every day | ORAL | Status: DC
  Filled 2015-09-04 (×2): qty 1

## 2015-09-04 MED ORDER — MORPHINE SULFATE (PF) 0.5 MG/ML IJ SOLN
INTRAMUSCULAR | Status: AC
  Filled 2015-09-04: qty 10

## 2015-09-04 SURGICAL SUPPLY — 37 items
CHLORAPREP W/TINT 26ML (MISCELLANEOUS) ×6 IMPLANT
CLAMP CORD UMBIL (MISCELLANEOUS) IMPLANT
CLOTH BEACON ORANGE TIMEOUT ST (SAFETY) ×3 IMPLANT
DRSG OPSITE POSTOP 4X10 (GAUZE/BANDAGES/DRESSINGS) ×3 IMPLANT
ELECT REM PT RETURN 9FT ADLT (ELECTROSURGICAL) ×3
ELECTRODE REM PT RTRN 9FT ADLT (ELECTROSURGICAL) ×1 IMPLANT
EXTRACTOR VACUUM BELL STYLE (SUCTIONS) IMPLANT
GLOVE BIOGEL PI IND STRL 7.0 (GLOVE) ×1 IMPLANT
GLOVE BIOGEL PI IND STRL 8 (GLOVE) ×1 IMPLANT
GLOVE BIOGEL PI INDICATOR 7.0 (GLOVE) ×2
GLOVE BIOGEL PI INDICATOR 8 (GLOVE) ×2
GLOVE ECLIPSE 8.0 STRL XLNG CF (GLOVE) ×3 IMPLANT
GOWN STRL REUS W/TWL LRG LVL3 (GOWN DISPOSABLE) ×6 IMPLANT
KIT ABG SYR 3ML LUER SLIP (SYRINGE) ×3 IMPLANT
LIQUID BAND (GAUZE/BANDAGES/DRESSINGS) ×4 IMPLANT
NDL HYPO 18GX1.5 BLUNT FILL (NEEDLE) ×1 IMPLANT
NDL HYPO 25X5/8 SAFETYGLIDE (NEEDLE) ×1 IMPLANT
NEEDLE HYPO 18GX1.5 BLUNT FILL (NEEDLE) ×3 IMPLANT
NEEDLE HYPO 22GX1.5 SAFETY (NEEDLE) ×3 IMPLANT
NEEDLE HYPO 25X5/8 SAFETYGLIDE (NEEDLE) ×3 IMPLANT
NS IRRIG 1000ML POUR BTL (IV SOLUTION) ×3 IMPLANT
PACK C SECTION WH (CUSTOM PROCEDURE TRAY) ×3 IMPLANT
PAD OB MATERNITY 4.3X12.25 (PERSONAL CARE ITEMS) ×3 IMPLANT
PENCIL SMOKE EVAC W/HOLSTER (ELECTROSURGICAL) ×3 IMPLANT
RTRCTR C-SECT PINK 25CM LRG (MISCELLANEOUS) IMPLANT
SUT CHROMIC 0 CT 1 (SUTURE) ×3 IMPLANT
SUT MNCRL 0 VIOLET CTX 36 (SUTURE) ×2 IMPLANT
SUT MONOCRYL 0 CTX 36 (SUTURE) ×4
SUT PLAIN 2 0 (SUTURE)
SUT PLAIN 2 0 XLH (SUTURE) IMPLANT
SUT PLAIN ABS 2-0 CT1 27XMFL (SUTURE) IMPLANT
SUT VIC AB 0 CTX 36 (SUTURE) ×3
SUT VIC AB 0 CTX36XBRD ANBCTRL (SUTURE) ×1 IMPLANT
SUT VIC AB 4-0 KS 27 (SUTURE) ×2 IMPLANT
SYR 20CC LL (SYRINGE) ×6 IMPLANT
TOWEL OR 17X24 6PK STRL BLUE (TOWEL DISPOSABLE) ×3 IMPLANT
TRAY FOLEY CATH SILVER 14FR (SET/KITS/TRAYS/PACK) IMPLANT

## 2015-09-04 NOTE — Addendum Note (Signed)
Addendum  created 09/04/15 1347 by Shanon PayorSuzanne M Roselynne Lortz, CRNA   Modules edited: Clinical Notes   Clinical Notes:  File: [id number]

## 2015-09-04 NOTE — Progress Notes (Signed)
UR chart review completed.  

## 2015-09-04 NOTE — Lactation Note (Signed)
This note was copied from a baby's chart. Lactation Consultation Note  Patient Name: Christine Mueller: 09/04/2015 Reason for consult: Initial assessment Baby at 14 hr of life. Mom reports baby is feeding well. She denies breast or nipple pain. She wants to pump to feed when she goes back to but would like to latch baby until then. Discussed baby behavior, feeding frequency, baby belly size, voids, wt loss, breast changes, and nipple care. She stated she can manually express and has spoon in room. Given lactation handouts. Aware of OP services and support group. She will call as needed.     Maternal Data Has patient been taught Hand Expression?: Yes Does the patient have breastfeeding experience prior to this delivery?: No  Feeding    LATCH Score/Interventions Latch: Repeated attempts needed to sustain latch, nipple held in mouth throughout feeding, stimulation needed to elicit sucking reflex.  Audible Swallowing: A few with stimulation  Type of Nipple: Everted at rest and after stimulation  Comfort (Breast/Nipple): Soft / non-tender     Hold (Positioning): Assistance needed to correctly position infant at breast and maintain latch.  LATCH Score: 7  Lactation Tools Discussed/Used WIC Program: Yes   Consult Status Consult Status: Follow-up Mueller: 09/05/15 Follow-up type: In-patient    Rulon Eisenmengerlizabeth E Clebert Wenger 09/04/2015, 5:55 PM

## 2015-09-04 NOTE — Progress Notes (Signed)
Patient ID: Christine Mueller, female   DOB: 1975-04-01, 40 y.o.   MRN: [id number] 3528w2d Estimated Date of Delivery: 09/09/15 induced for cHTN /Class A2 DM  Cervical change has really been no appreciable now for greater than 5 hours 8c/ 100/ -1, ?ROP, asynclitic to the left  FHR tracing had tachycardia when pt had an elevated temperature but is now in the 150s, there have been episodes of no variability associated but now there is minimal variability, so indication for proceeding with a Caesarean section is secondary arrest of dilatation  Pt has a remote reaction to penicillin in the first grade of swelling, as far as she knows never had a problem with ancef/keflex, will use that as surgical prophylaxis  Christine Mueller,Christine H, MD 09/04/2015 3:08 AM

## 2015-09-04 NOTE — Anesthesia Postprocedure Evaluation (Signed)
Anesthesia Post Note  Patient: Christine Mueller  Procedure(s) Performed: Procedure(s) (LRB): CESAREAN SECTION (N/A)  Patient location during evaluation: PACU Anesthesia Type: Epidural Level of consciousness: oriented and awake and alert Pain management: pain level controlled Vital Signs Assessment: post-procedure vital signs reviewed and stable Respiratory status: spontaneous breathing, respiratory function stable and patient connected to nasal cannula oxygen Cardiovascular status: blood pressure returned to baseline and stable Postop Assessment: no headache, no backache and epidural receding Anesthetic complications: no     Last Vitals:  Filed Vitals:   09/04/15 0431 09/04/15 0445  BP: 159/72 131/85  Pulse: 107 101  Temp: 36.9 C   Resp: 18 20    Last Pain:  Filed Vitals:   09/04/15 0451  PainSc: 0-No pain   Pain Goal: Patients Stated Pain Goal: 2 (09/02/15 1922)               Shelton SilvasKevin D Hollis

## 2015-09-04 NOTE — Anesthesia Postprocedure Evaluation (Signed)
Anesthesia Post Note  Patient: Christine Mueller  Procedure(s) Performed: Procedure(s) (LRB): CESAREAN SECTION (N/A)  Patient location during evaluation: Mother Baby Anesthesia Type: Epidural Level of consciousness: awake and alert and oriented Pain management: pain level controlled Vital Signs Assessment: post-procedure vital signs reviewed and stable Respiratory status: spontaneous breathing and nonlabored ventilation Cardiovascular status: stable Postop Assessment: no headache, no backache, patient able to bend at knees, no signs of nausea or vomiting and adequate PO intake Anesthetic complications: no     Last Vitals:  Filed Vitals:   09/04/15 1040 09/04/15 1137  BP:    Pulse:    Temp:    Resp: 20 18    Last Pain:  Filed Vitals:   09/04/15 1137  PainSc: 3    Pain Goal: Patients Stated Pain Goal: 3 (09/04/15 0940)               Madison HickmanGREGORY,Valerio Pinard

## 2015-09-04 NOTE — Op Note (Signed)
Preoperative diagnosis:  1.  Intrauterine pregnancy at 6859w2d  weeks gestation                                         2.  Class A2 DM                                         3.  Chronic Hypertension                                         4.  Secondary arrest of dilatation and descent                                         5.  Persistent ROP and asynclitism   Postoperative diagnosis:  Same as above   Procedure:  Primary cesarean section  Surgeon:  Lazaro ArmsLuther H Jamani Bearce MD  Assistant:    Anesthesia: Spinal  Findings:  .    Over a low transverse incision was delivered a viable female with Apgars of 9 and 9 weighing pending lbs.  oz. Uterus, tubes and ovaries were all normal.  There were no other significant findings  Description of operation:  Patient was taken to the operating room and placed in the sitting position where she underwent a spinal anesthetic. She was then placed in the supine position with tilt to the left side. When adequate anesthetic level was obtained she was prepped and draped in usual sterile fashion and a Foley catheter was placed. A Pfannenstiel skin incision was made and carried down sharply to the rectus fascia which was scored in the midline extended laterally. The fascia was taken off the muscles both superiorly and without difficulty. The muscles were divided.  The peritoneal cavity was entered.  Bladder blade was placed, no bladder flap was created.  A low transverse hysterotomy incision was made and delivered a viable female  infant at [id number] with Apgars of 9 and 9 weighingpending lbs  oz.  Cord pH was obtained and was 7.298. The uterus was exteriorized. It was closed in 2 layers, the first being a running interlocking layer and the second being an imbricating layer using 0 monocryl on a CTX needle. There was good resulting hemostasis. The uterus tubes and ovaries were all normal. Peritoneal cavity was irrigated vigorously. The muscles and peritoneum were reapproximated loosely.  The fascia was closed using 0 Vicryl in running fashion. Subcutaneous tissue was made hemostatic and irrigated. The skin was closed using 4-0 Vicryl on a Keith needle in a subcuticular fashion.  Liquiban was placed for additional wound integrity and to serve as a barrier. Blood loss for the procedure was 500 cc. The patient received 2 gram of Ancef prophylactically. The patient was taken to the recovery room in good stable condition with all counts being correct x3.  Due to preoperative diagnosis of intra amniotic infection (triple I) i am keeping pt on cefoxin for 24 hours post op  EBL 500 cc  Harshaan Whang H 09/04/2015 4:21 AM

## 2015-09-04 NOTE — Transfer of Care (Signed)
Immediate Anesthesia Transfer of Care Note  Patient: Christine Mueller  Procedure(s) Performed: Procedure(s): CESAREAN SECTION (N/A)  Patient Location: PACU  Anesthesia Type:Epidural  Level of Consciousness: awake  Airway & Oxygen Therapy: Patient Spontanous Breathing  Post-op Assessment: Report given to RN and Post -op Vital signs reviewed and stable  Post vital signs: stable  Last Vitals:  Filed Vitals:   09/04/15 0232 09/04/15 0300  BP: 150/73 133/87  Pulse: 107 107  Temp:    Resp:  18    Last Pain:  Filed Vitals:   09/04/15 0424  PainSc: 3       Patients Stated Pain Goal: 2 (09/02/15 1922)  Complications: No apparent anesthesia complications

## 2015-09-05 ENCOUNTER — Encounter (HOSPITAL_COMMUNITY): Payer: Self-pay

## 2015-09-05 LAB — CBC
HEMATOCRIT: 28.4 % — AB (ref 36.0–46.0)
HEMOGLOBIN: 9.3 g/dL — AB (ref 12.0–15.0)
MCH: 28.8 pg (ref 26.0–34.0)
MCHC: 32.7 g/dL (ref 30.0–36.0)
MCV: 87.9 fL (ref 78.0–100.0)
Platelets: 183 10*3/uL (ref 150–400)
RBC: 3.23 MIL/uL — ABNORMAL LOW (ref 3.87–5.11)
RDW: 15 % (ref 11.5–15.5)
WBC: 12.8 10*3/uL — AB (ref 4.0–10.5)

## 2015-09-05 LAB — GLUCOSE, CAPILLARY: Glucose-Capillary: 73 mg/dL (ref 65–99)

## 2015-09-05 MED ORDER — OXYCODONE-ACETAMINOPHEN 5-325 MG PO TABS
1.0000 | ORAL_TABLET | ORAL | Status: DC | PRN
  Administered 2015-09-05 (×3): 1 via ORAL
  Administered 2015-09-06: 2 via ORAL
  Administered 2015-09-06: 1 via ORAL
  Filled 2015-09-05 (×4): qty 1
  Filled 2015-09-05: qty 2

## 2015-09-05 MED ORDER — RHO D IMMUNE GLOBULIN 1500 UNIT/2ML IJ SOSY
300.0000 ug | PREFILLED_SYRINGE | Freq: Once | INTRAMUSCULAR | Status: AC
  Administered 2015-09-05: 300 ug via INTRAMUSCULAR
  Filled 2015-09-05: qty 2

## 2015-09-05 NOTE — Lactation Note (Signed)
This note was copied from a baby's chart. Lactation Consultation Note LC attempted visit earlier and mom was walking.  LC revisited and updated feedings.  Mom denies concerns, but the RN to bedside to give mom pain meds and LC offered to return later.  LC attempted visit at 41 hours of age and mom is walking.  Rn reports improved latch with ok output as baby has had an additional void.  LC to follow as needed.    Patient Name: Christine Silas FloodDalila Mueller JYNWG'NToday's Date: 09/05/2015     Maternal Data    Feeding Feeding Type: Breast Fed Length of feed: 15 min  LATCH Score/Interventions Latch: Grasps breast easily, tongue down, lips flanged, rhythmical sucking.  Audible Swallowing: A few with stimulation (colostrum easily expressed)  Type of Nipple: Everted at rest and after stimulation  Comfort (Breast/Nipple): Soft / non-tender     Hold (Positioning): No assistance needed to correctly position infant at breast.  LATCH Score: 9  Lactation Tools Discussed/Used     Consult Status      Jannifer RodneyShoptaw, Alashia Brownfield Lynn 09/05/2015, 9:24 PM

## 2015-09-05 NOTE — Progress Notes (Signed)
Patient reports she has not had a bowel movement for over 1 month. She has expelled flatus today. Patient went to smoke this afternoon around 4 pm. Patient is declining the nicotine patch until later this evening. She states she will not go out to smoke.

## 2015-09-05 NOTE — Progress Notes (Signed)
LTCS dressing was intact, slean and dry on am assessment. Patient showered and dressing was half off( on the left side). Dr Chanetta Marshallimberlake called and order obtained to change dressing. Dressing changed with sterile technique.Site intact, unremarkable.No drainage noted.

## 2015-09-05 NOTE — Progress Notes (Signed)
Post Op Day 1  Subjective:  Christine Mueller is a 40 y.o. G2P1011 4859w2d s/p pLTCS for failure to progress. cHTN and A2GDM. No acute events overnight.  Pt denies problems with ambulating, voiding or po intake.  She denies nausea or vomiting.  Pain is moderately controlled.  She has not had flatus. She has not had bowel movement.  Lochia Small.  Plan for birth control is OCP.  Method of Feeding: breast  Objective: BP 132/69 mmHg  Pulse 81  Temp(Src) 98 F (36.7 C) (Oral)  Resp 18  Ht 5\' 3"  (1.6 m)  Wt 75.751 kg (167 lb)  BMI 29.59 kg/m2  SpO2 97%  LMP 12/03/2014 (Exact Date)  Breastfeeding? Unknown  Physical Exam:  General: alert, cooperative and no distress Lochia:normal flow Chest: CTAB Heart: RRR no m/r/g Abdomen: +BS, soft, nontender, fundus firm at/below umbilicus Uterine Fundus: firm; incision site dressing is dry and clean  DVT Evaluation: No evidence of DVT seen on physical exam. Extremities: no edema   Recent Labs  09/04/15 0521 09/05/15 0504  HGB 10.9* 9.3*  HCT 33.3* 28.4*    Assessment/Plan:  ASSESSMENT: Christine Mueller is a 40 y.o. G2P1011 8359w2d pod #1 s/p pLTCS 2/2 failure to progress. Triple I s/p cefoxitan x 24 hours.  Afebrile over 24 hours.  cHTN with stable BPs without anti-hypertensives. A2GDM with fasting CBG 73. POD hgb 9.3 from 10.9.  doing well. Patient is Rh negative, newborn is Rh positive: will need rhogam prior to discharge (ordered).   Expected Management    LOS: 3 days   Palma HolterKanishka G Gunadasa 09/05/2015, 9:26 AM  I have seen and examined this patient and agree the above assessment.  Respiratory effort normal, lochia appropriate, legs negative,  pain level normal.  CRESENZO-DISHMAN,Winfield Caba 09/10/2015 12:24 PM

## 2015-09-05 NOTE — Progress Notes (Signed)
Mother removed ID bracelet with the matching baby number. Bracelets changed.old bracelet number [id number], new number [id number].  

## 2015-09-06 LAB — RH IG WORKUP (INCLUDES ABO/RH)
ABO/RH(D): B NEG
FETAL SCREEN: NEGATIVE
Gestational Age(Wks): 39.2
UNIT DIVISION: 0

## 2015-09-06 MED ORDER — SENNOSIDES-DOCUSATE SODIUM 8.6-50 MG PO TABS: 2.0000 | ORAL_TABLET | Freq: Every evening | ORAL | Status: DC | PRN

## 2015-09-06 MED ORDER — ACETAMINOPHEN 325 MG PO TABS: 650.0000 mg | ORAL_TABLET | ORAL | Status: DC | PRN

## 2015-09-06 MED ORDER — OXYCODONE HCL 5 MG PO TABS: 5.0000 mg | ORAL_TABLET | ORAL | Status: DC | PRN

## 2015-09-06 NOTE — Discharge Instructions (Signed)
Hypertension During Pregnancy °Hypertension, or high blood pressure, is when there is extra pressure inside your blood vessels that carry blood from the heart to the rest of your body (arteries). It can happen at any time in life, including pregnancy. Hypertension during pregnancy can cause problems for you and your baby. Your baby might not weigh as much as he or she should at birth or might be born early (premature). Very bad cases of hypertension during pregnancy can be life-threatening.  °Different types of hypertension can occur during pregnancy. These include: °· Chronic hypertension. This happens when a woman has hypertension before pregnancy and it continues during pregnancy. °· Gestational hypertension. This is when hypertension develops during pregnancy. °· Preeclampsia or toxemia of pregnancy. This is a very serious type of hypertension that develops only during pregnancy. It affects the whole body and can be very dangerous for both mother and baby.   °Gestational hypertension and preeclampsia usually go away after your baby is born. Your blood pressure will likely stabilize within 6 weeks. Women who have hypertension during pregnancy have a greater chance of developing hypertension later in life or with future pregnancies. °RISK FACTORS °There are certain factors that make it more likely for you to develop hypertension during pregnancy. These include: °· Having hypertension before pregnancy. °· Having hypertension during a previous pregnancy. °· Being overweight. °· Being older than 40 years. °· Being pregnant with more than one baby. °· Having diabetes or kidney problems. °SIGNS AND SYMPTOMS °Chronic and gestational hypertension rarely cause symptoms. Preeclampsia has symptoms, which may include: °· Increased protein in your urine. Your health care provider will check for this at every prenatal visit. °· Swelling of your hands and face. °· Rapid weight gain. °· Headaches. °· Visual changes. °· Being  bothered by light. °· Abdominal pain, especially in the upper right area. °· Chest pain. °· Shortness of breath. °· Increased reflexes. °· Seizures. These occur with a more severe form of preeclampsia, called eclampsia. °DIAGNOSIS  °You may be diagnosed with hypertension during a regular prenatal exam. At each prenatal visit, you may have: °· Your blood pressure checked. °· A urine test to check for protein in your urine. °The type of hypertension you are diagnosed with depends on when you developed it. It also depends on your specific blood pressure reading. °· Developing hypertension before 20 weeks of pregnancy is consistent with chronic hypertension. °· Developing hypertension after 20 weeks of pregnancy is consistent with gestational hypertension. °· Hypertension with increased urinary protein is diagnosed as preeclampsia. °· Blood pressure measurements that stay above 160 systolic or 110 diastolic are a sign of severe preeclampsia. °TREATMENT °Treatment for hypertension during pregnancy varies. Treatment depends on the type of hypertension and how serious it is. °· If you take medicine for chronic hypertension, you may need to switch medicines. °¨ Medicines called ACE inhibitors should not be taken during pregnancy. °¨ Low-dose aspirin may be suggested for women who have risk factors for preeclampsia. °· If you have gestational hypertension, you may need to take a blood pressure medicine that is safe during pregnancy. Your health care provider will recommend the correct medicine. °· If you have severe preeclampsia, you may need to be in the hospital. Health care providers will watch you and your baby very closely. You also may need to take medicine called magnesium sulfate to prevent seizures and lower blood pressure. °· Sometimes, an early delivery is needed. This may be the case if the condition worsens. It would be   done to protect you and your baby. The only cure for preeclampsia is delivery.  Your health  care provider may recommend that you take one low-dose aspirin (81 mg) each day to help prevent high blood pressure during your pregnancy if you are at risk for preeclampsia. You may be at risk for preeclampsia if:  You had preeclampsia or eclampsia during a previous pregnancy.  Your baby did not grow as expected during a previous pregnancy.  You experienced preterm birth with a previous pregnancy.  You experienced a separation of the placenta from the uterus (placental abruption) during a previous pregnancy.  You experienced the loss of your baby during a previous pregnancy.  You are pregnant with more than one baby.  You have other medical conditions, such as diabetes or an autoimmune disease. HOME CARE INSTRUCTIONS  Schedule and keep all of your regular prenatal care appointments. This is important.  Take medicines only as directed by your health care provider. Tell your health care provider about all medicines you take.  Eat as little salt as possible.  Get regular exercise.  Do not drink alcohol.  Do not use tobacco products.  Do not drink products with caffeine.  Lie on your left side when resting. SEEK IMMEDIATE MEDICAL CARE IF:  You have severe abdominal pain.  You have sudden swelling in your hands, ankles, or face.  You gain 4 pounds (1.8 kg) or more in 1 week.  You vomit repeatedly.  You have vaginal bleeding.  You do not feel your baby moving as much.  You have a headache.  You have blurred or double vision.  You have muscle twitching or spasms.  You have shortness of breath.  You have blue fingernails or lips.  You have blood in your urine. MAKE SURE YOU:  Understand these instructions.  Will watch your condition.  Will get help right away if you are not doing well or get worse.   This information is not intended to replace advice given to you by your health care provider. Make sure you discuss any questions you have with your health care  provider.   Document Released: 10/27/2010 Document Revised: 03/01/2014 Document Reviewed: 09/07/2012 Elsevier Interactive Patient Education 2016 [employer] Cesarean Delivery, Care After Refer to this sheet in the next few weeks. These instructions provide you with information on caring for yourself after your procedure. Your health care provider may also give you specific instructions. Your treatment has been planned according to current medical practices, but problems sometimes occur. Call your health care provider if you have any problems or questions after you go home. HOME CARE INSTRUCTIONS  Only take over-the-counter or prescription medications as directed by your health care provider.  Do not drink alcohol, especially if you are breastfeeding or taking medication to relieve pain.  Do not chew or smoke tobacco.  Continue to use good perineal care. Good perineal care includes:  Wiping your perineum from front to back.  Keeping your perineum clean.  Check your surgical cut (incision) daily for increased redness, drainage, swelling, or separation of skin.  Clean your incision gently with soap and water every day, and then pat it dry. If your health care provider says it is okay, leave the incision uncovered. Use a bandage (dressing) if the incision is draining fluid or appears irritated. If the adhesive strips across the incision do not fall off within 7 days, carefully peel them off.  Hug a pillow when coughing or sneezing until your incision is  healed. This helps to relieve pain.  Do not use tampons or douche until your health care provider says it is okay.  Shower, wash your hair, and take tub baths as directed by your health care provider.  Wear a well-fitting bra that provides breast support.  Limit wearing support panties or control-top hose.  Drink enough fluids to keep your urine clear or pale yellow.  Eat high-fiber foods such as whole grain cereals and breads,  brown rice, beans, and fresh fruits and vegetables every day. These foods may help prevent or relieve constipation.  Resume activities such as climbing stairs, driving, lifting, exercising, or traveling as directed by your health care provider.  Talk to your health care provider about resuming sexual activities. This is dependent upon your risk of infection, your rate of healing, and your comfort and desire to resume sexual activity.  Try to have someone help you with your household activities and your newborn for at least a few days after you leave the hospital.  Rest as much as possible. Try to rest or take a nap when your newborn is sleeping.  Increase your activities gradually.  Keep all of your scheduled postpartum appointments. It is very important to keep your scheduled follow-up appointments. At these appointments, your health care provider will be checking to make sure that you are healing physically and emotionally. SEEK MEDICAL CARE IF:   You are passing large clots from your vagina. Save any clots to show your health care provider.  You have a foul smelling discharge from your vagina.  You have trouble urinating.  You are urinating frequently.  You have pain when you urinate.  You have a change in your bowel movements.  You have increasing redness, pain, or swelling near your incision.  You have pus draining from your incision.  Your incision is separating.  You have painful, hard, or reddened breasts.  You have a severe headache.  You have blurred vision or see spots.  You feel sad or depressed.  You have thoughts of hurting yourself or your newborn.  You have questions about your care, the care of your newborn, or medications.  You are dizzy or light-headed.  You have a rash.  You have pain, redness, or swelling at the site of the removed intravenous access (IV) tube.  You have nausea or vomiting.  You stopped breastfeeding and have not had a  menstrual period within 12 weeks of stopping.  You are not breastfeeding and have not had a menstrual period within 12 weeks of delivery.  You have a fever. SEEK IMMEDIATE MEDICAL CARE IF:  You have persistent pain.  You have chest pain.  You have shortness of breath.  You faint.  You have leg pain.  You have stomach pain.  Your vaginal bleeding saturates 2 or more sanitary pads in 1 hour. MAKE SURE YOU:   Understand these instructions.  Will watch your condition.  Will get help right away if you are not doing well or get worse.   This information is not intended to replace advice given to you by your health care provider. Make sure you discuss any questions you have with your health care provider.   Document Released: 10/31/2001 Document Revised: 03/01/2014 Document Reviewed: 10/06/2011 Elsevier Interactive Patient Education [employer]

## 2015-09-06 NOTE — Discharge Summary (Signed)
OB Discharge Summary     Patient Name: Christine Mueller DOB: 16-Mar-1975 MRN: [id number]  Date of admission: 09/02/2015 Delivering MD: Tania Ade H   Date of discharge: 09/06/2015  Admitting diagnosis: INDUCTION Intrauterine pregnancy: [redacted]w[redacted]d    Secondary diagnosis:  Principal Problem:   Chronic hypertension during pregnancy, antepartum Active Problems:   Advanced maternal age in multigravida   Supervision of high risk pregnancy, antepartum   Gestational diabetes mellitus (GDM), antepartum   Rh negative status during pregnancy   S/P cesarean section  Additional problems: none     Discharge diagnosis: Term Pregnancy Delivered                                                                                                Post partum procedures: none  Augmentation: Pitocin and Cytotec and Foley  Complications: None  Hospital course:  Induction of Labor With Cesarean Section  40y.o. yo G2P1011 at 332w2das admitted to the hospital 09/02/2015 for induction of labor for chtn and a2gdm. Patient had a labor course significant for arrest. The patient went for cesarean section due to Arrest of Dilation, and delivered a Viable infant. Membrane Rupture Time/Date: )2:20 PM ,09/03/2015   _0  of operation can be found in separate operative Note.  Patient had an uncomplicated postpartum course. She is ambulating, tolerating a regular diet, passing flatus, and urinating well.  Patient is discharged home in stable condition on 09/06/2015.                                   A2GDM: fasting glucose in the 70s; counseled will need 2-hour gtt 6-12 wks pp  Chtn: no bp meds PP; no s/s preeclampsia. No discharge bp meds. Nurse visit next Monday and Friday for BP check. CoEnolaD return precautions.  C/s: post-op H 9.3; no complications  Rh neg: rhogam given  Triple I: treated. No s/s PP endometritis   Physical exam  Filed Vitals:   09/05/15 0245 09/05/15 1907 09/06/15 0638  09/06/15 0750  BP: 132/69  158/83 128/63  Pulse: 81  59 61  Temp: 98 F (36.7 C) 98 F (36.7 C) 98.7 F (37.1 C) 98.1 F (36.7 C)  TempSrc: Oral Oral  Oral  Resp: _1 Height:      Weight:      SpO2: 97%      General: alert, cooperative and no distress Lochia: appropriate Uterine Fundus: firm Incision: No significant erythema, Dressing is clean, dry, and intact DVT Evaluation: No cords or calf tenderness. No significant calf/ankle edema. Labs: Lab Results  Component Value Date   WBC 12.8* 09/05/2015   HGB 9.3* 09/05/2015   HCT 28.4* 09/05/2015   MCV 87.9 09/05/2015   PLT 183 09/05/2015   CMP Latest Ref Rng 09/02/2015  Glucose 65 - 99 mg/dL 108(H)  BUN 6 - 20 mg/dL 9  Creatinine 0.44 - 1.00 mg/dL 0.65  Sodium 135 - 145 mmol/L 135  Potassium 3.5 - 5.1 mmol/L 3.6  Chloride  101 - 111 mmol/L 105  CO2 22 - 32 mmol/L 22  Calcium 8.9 - 10.3 mg/dL 8.6(L)  Total Protein 6.5 - 8.1 g/dL 5.8(L)  Total Bilirubin 0.3 - 1.2 mg/dL 0.5  Alkaline Phos 38 - 126 U/L 195(H)  AST 15 - 41 U/L 22  ALT 14 - 54 U/L 21    Discharge instruction: per After Visit Summary and "Baby and Me Booklet".  After visit meds:    Medication List    STOP taking these medications        ACCU-CHEK AVIVA PLUS w/Device Kit     ACCU-CHEK FASTCLIX LANCETS Misc     glucose blood test strip     glyBURIDE 2.5 MG tablet  Commonly known as:  DIABETA     labetalol 100 MG tablet  Commonly known as:  NORMODYNE     loratadine 10 MG tablet  Commonly known as:  CLARITIN     ondansetron 4 MG tablet  Commonly known as:  ZOFRAN     pantoprazole 20 MG tablet  Commonly known as:  PROTONIX     ranitidine 150 MG tablet  Commonly known as:  ZANTAC      TAKE these medications        acetaminophen 325 MG tablet  Commonly known as:  TYLENOL  Take 2 tablets (650 mg total) by mouth every 4 (four) hours as needed (for pain scale < 4).     albuterol 108 (90 Base) MCG/ACT inhaler  Commonly known as:   PROVENTIL HFA;VENTOLIN HFA  Inhale 2 puffs into the lungs every 6 (six) hours as needed for wheezing.     oxyCODONE 5 MG immediate release tablet  Commonly known as:  ROXICODONE  Take 1 tablet (5 mg total) by mouth every 4 (four) hours as needed for severe pain.     prenatal multivitamin Tabs tablet  Take 1 tablet by mouth daily at 12 noon.     senna-docusate 8.6-50 MG tablet  Commonly known as:  Senokot-S  Take 2 tablets by mouth at bedtime as needed for mild constipation.        Diet: routine diet  Activity: Advance as tolerated. Pelvic rest for 6 weeks.   Outpatient follow up:6 weeks Follow up Appt:No future appointments. Follow up Visit:No Follow-up on file.  Postpartum contraception: Combination OCPs @ 6 wks  Newborn Data: Live born female  Birth Weight: 6 lb 15.5 oz (3160 g) APGAR: 9, 9  Baby Feeding: Breast Disposition: pending   09/06/2015 Desma Maxim, MD

## 2015-09-12 ENCOUNTER — Telehealth: Payer: Self-pay | Admitting: General Practice

## 2015-09-12 NOTE — Telephone Encounter (Signed)
Patient called and left message stating she had an emergency c-section on 7/13 and wants to know if she needs a follow up appt for a wound check

## 2015-09-15 ENCOUNTER — Encounter: Payer: Self-pay | Admitting: Obstetrics & Gynecology

## 2015-09-17 NOTE — Telephone Encounter (Signed)
Sent patient a mychart message.

## 2015-10-16 ENCOUNTER — Ambulatory Visit (INDEPENDENT_AMBULATORY_CARE_PROVIDER_SITE_OTHER): Payer: Medicaid Other | Admitting: Advanced Practice Midwife

## 2015-10-16 ENCOUNTER — Encounter: Payer: Self-pay | Admitting: Advanced Practice Midwife

## 2015-10-16 VITALS — BP 158/99 | Wt 139.6 lb

## 2015-10-16 DIAGNOSIS — I10 Essential (primary) hypertension: Secondary | ICD-10-CM

## 2015-10-16 MED ORDER — LISINOPRIL 10 MG PO TABS: 10.0000 mg | ORAL_TABLET | Freq: Every day | ORAL | 1 refills | Status: DC

## 2015-10-16 NOTE — Patient Instructions (Signed)
Glucose Tolerance Test The glucose tolerance test (GTT) is one of several tests used to diagnose diabetes mellitus. The GTT is a blood test, and it may include a urine test as well. The GTT checks to see how your body processes sugar (glucose). For this test, you will consume a drink containing a high level of glucose. Your blood glucose levels will be checked before you consume the drink and then again 1, 2, 3, and possibly 4 hours after you consume it. Your health care provider may recommend that you have the GTT if you:  Have a family history of diabetes.   Are very overweight (obese).   Have experienced infections that keep coming back.   Have had numerous cuts or wounds that did not heal quickly, especially on your legs and feet.   Are a woman and have a history of giving birth to very large babies or a history of repeated fetal loss (stillbirth).  Have had glucose in your urine or high blood sugar:   During pregnancy.   After a heart attack, surgery, or prolonged periods of high stress.  The GTT lasts 3-4 hours. Other than the glucose solution, you will not be allowed to eat or drink anything during the test. You must remain at the testing location to make sure that your blood and urine samples are taken on time. PREPARATION FOR TEST Eat normally for 3 days prior to the GTT test, including having plenty of carbohydrate-rich foods. Do not eat or drink anything except water during the final 12 hours before the test. You should not smoke or exercise during the test. In addition, your health care provider may ask you to stop taking certain medicines before the test. RESULTS It is your responsibility to obtain your test results. Ask the lab or department performing the test when and how you will get your results. Contact your health care provider to discuss any questions you have about your results. Range of Normal Values Ranges for normal values may vary among different labs and  hospitals. You should always check with your health care provider after having lab work or other tests done to discuss whether your values are considered within normal limits.  Normal levels of blood glucose are as follows:  Fasting: less than 110 mg/dL or less than 6.1 mmol/L (SI units).  1 hour after consuming the glucose drink: less than 200 mg/dL or less than 11.1 mmol/L.  2 hours after consuming the glucose drink: less than 140 mg/dL or less than 7.8 mmol/L.  3 hours after consuming the glucose drink: 70-115 mg/dL or less than 6.4 mmol/L.  4 hours after consuming the glucose drink: 70-115 mg/dL or less than 6.4 mmol/L. The normal result for the urine test is negative, meaning that glucose is absent from your urine. Some substances can interfere with GTT results. These may include:  Blood pressure and heart failure medicines, including beta blockers, furosemide, and thiazides.   Anti-inflammatory medicines, including aspirin.   Nicotine.   Some psychiatric medicines.   Oral contraceptives.   Diuretics or corticosteroids. Meaning of Results Outside Normal Value Ranges GTT test results that are above normal values may indicate health problems, such as:  Diabetes mellitus.   Acute stress response.   Cushing syndrome.   Tumors such as pheochromocytoma or glucagonoma.   Chronic renal failure.   Pancreatitis.   Hyperthyroidism.   Current infection.  Discuss your test results with your health care provider. He or she will use the results   to make a diagnosis and determine a treatment plan that is right for you.   This information is not intended to replace advice given to you by your health care provider. Make sure you discuss any questions you have with your health care provider.   Document Released: 03/03/2004 Document Revised: 03/01/2014 Document Reviewed: 06/15/2013 Elsevier Interactive Patient Education 2016 Elsevier Inc.  

## 2015-10-16 NOTE — Progress Notes (Signed)
Subjective:     Daneil DanDalila L Poulson is a 40 y.o. female who presents for a postpartum visit. She is 6 weeks postpartum following a delivery on 09/04/2015. I have fully reviewed the prenatal and intrapartum course. The delivery was at 39  gestational weeks. Outcome: c-secton. Anesthesia: Epidual. Postpartum course has been umcomplicated. Baby's course has been uncomplicated. Baby is feeding by bottle. Bleeding yes. Bowel function is normal. Bladder function is normal. Patient is not sexually active. Contraception method is abstinence. Postpartum depression screening: negative.  The following portions of the patient's history were reviewed and updated as appropriate: allergies, current medications, past family history, past medical history, past social history, past surgical history and problem list.  Review of Systems Pertinent items are noted in HPI.   Objective:    BP (!) 158/99   Wt 139 lb 9.6 oz (63.3 kg)   BMI 24.73 kg/m   General:  alert, cooperative, appears stated age and no distress   Breasts:  Declined  Lungs: clear to auscultation bilaterally  Heart:  regular rate and rhythm, S1, S2 normal, no murmur, click, rub or gallop  Abdomen: soft, non-tender; bowel sounds normal; no masses,  no organomegaly   Vulva:  not evaluated        Assessment:     Normal postpartum exam.  CHTN. Rx Lisinopril and F/U w/ PCP GDM-2 hour GTT ordered Pap smear not done at today's visit.   Plan:    1. Contraception: Depo-Provera injections. Will come back for Depo. No IC until then.  2. Follow up : ASAP 2 hour GTT or as needed.

## 2015-10-20 ENCOUNTER — Other Ambulatory Visit: Payer: Medicaid Other

## 2015-10-20 DIAGNOSIS — O99814 Abnormal glucose complicating childbirth: Secondary | ICD-10-CM

## 2015-10-20 LAB — GLUCOSE TOLERANCE, 2 HOURS
GLUCOSE, FASTING: 83 mg/dL (ref 65–99)
Glucose, 2 hour: 136 mg/dL (ref <140)

## 2015-10-22 ENCOUNTER — Encounter: Payer: Self-pay | Admitting: Advanced Practice Midwife

## 2015-10-22 NOTE — Progress Notes (Signed)
Postpartum 2 hour GTT normal. No Type 2 Diabetes.

## 2015-12-23 ENCOUNTER — Encounter: Payer: Self-pay | Admitting: Family Medicine

## 2015-12-23 ENCOUNTER — Ambulatory Visit (INDEPENDENT_AMBULATORY_CARE_PROVIDER_SITE_OTHER): Payer: Medicaid Other | Admitting: Family Medicine

## 2015-12-23 VITALS — BP 142/83 | HR 81 | Temp 97.7°F | Ht 63.0 in | Wt 135.4 lb

## 2015-12-23 DIAGNOSIS — Z720 Tobacco use: Secondary | ICD-10-CM

## 2015-12-23 DIAGNOSIS — Z0000 Encounter for general adult medical examination without abnormal findings: Secondary | ICD-10-CM | POA: Diagnosis not present

## 2015-12-23 DIAGNOSIS — I10 Essential (primary) hypertension: Secondary | ICD-10-CM

## 2015-12-23 DIAGNOSIS — Z113 Encounter for screening for infections with a predominantly sexual mode of transmission: Secondary | ICD-10-CM | POA: Insufficient documentation

## 2015-12-23 DIAGNOSIS — R739 Hyperglycemia, unspecified: Secondary | ICD-10-CM

## 2015-12-23 DIAGNOSIS — Z23 Encounter for immunization: Secondary | ICD-10-CM | POA: Diagnosis not present

## 2015-12-23 HISTORY — DX: Essential (primary) hypertension: I10

## 2015-12-23 LAB — POCT GLYCOSYLATED HEMOGLOBIN (HGB A1C): HEMOGLOBIN A1C: 5

## 2015-12-23 MED ORDER — VARENICLINE TARTRATE 0.5 MG PO TABS: ORAL_TABLET | ORAL | 1 refills | Status: DC

## 2015-12-23 NOTE — Patient Instructions (Signed)
It was a pleasure to meet you today. Please see below to review our plan for today's visit.  1. We will try Chantix for smoking cessation. Please start taking this as prescribed. You must completely stop smoking 1 week after beginning Chantix. The idea is you have less effects of the side effects of stopping.  2. If you have feeling of depression or suicide, please stop this medications. These side effects are very rare and I do not anticipate them. Do not breast feed while taking Chantix and keep the pills somewhere safe away from your baby. Below is some information on this medication. 3. You can call 1-800-QUIT-NOW for free assistance if needed. 4. Follow up in 1 month.  Please call the clinic at [phone] if your symptoms worsen or you have any concerns. It was my pleasure to see you. -- Durward Parcelavid McMullen, DO [facility], PGY-1  Varenicline oral tablets What is this medicine? VARENICLINE (var EN i kleen) is used to help people quit smoking. It can reduce the symptoms caused by stopping smoking. It is used with a patient support program recommended by your physician. This medicine may be used for other purposes; ask your health care provider or pharmacist if you have questions. What should I tell my health care provider before I take this medicine? They need to know if you have any of these conditions: -bipolar disorder, depression, schizophrenia or other mental illness -heart disease -if you often drink alcohol -kidney disease -peripheral vascular disease -seizures -stroke -suicidal thoughts, plans, or attempt; a previous suicide attempt by you or a family member -an unusual or allergic reaction to varenicline, other medicines, foods, dyes, or preservatives -pregnant or trying to get pregnant -breast-feeding How should I use this medicine? Take this medicine by mouth after eating. Take with a full glass of water. Follow the directions on the prescription label.  Take your doses at regular intervals. Do not take your medicine more often than directed. There are 3 ways you can use this medicine to help you quit smoking; talk to your health care professional to decide which plan is right for you: 1) you can choose a quit date and start this medicine 1 week before the quit date, or, 2) you can start taking this medicine before you choose a quit date, and then pick a quit date between day 8 and 35 days of treatment, or, 3) if you are not sure that you are able or willing to quit smoking right away, start taking this medicine and slowly decrease the amount you smoke as directed by your health care professional with the goal of being cigarette-free by week 12 of treatment. Stick to your plan; ask about support groups or other ways to help you remain cigarette-free. If you are motivated to quit smoking and did not succeed during a previous attempt with this medicine for reasons other than side effects, or if you returned to smoking after this treatment, speak with your health care professional about whether another course of this medicine may be right for you. A special MedGuide will be given to you by the pharmacist with each prescription and refill. Be sure to read this information carefully each time. Talk to your pediatrician regarding the use of this medicine in children. This medicine is not approved for use in children. Overdosage: If you think you have taken too much of this medicine contact a poison control center or emergency room at once. NOTE: This medicine is only for  you. Do not share this medicine with others. What if I miss a dose? If you miss a dose, take it as soon as you can. If it is almost time for your next dose, take only that dose. Do not take double or extra doses. What may interact with this medicine? -alcohol or any product that contains alcohol -insulin -other stop smoking aids -theophylline -warfarin This list may not describe all  possible interactions. Give your health care provider a list of all the medicines, herbs, non-prescription drugs, or dietary supplements you use. Also tell them if you smoke, drink alcohol, or use illegal drugs. Some items may interact with your medicine. What should I watch for while using this medicine? Visit your doctor or health care professional for regular check ups. Ask for ongoing advice and encouragement from your doctor or healthcare professional, friends, and family to help you quit. If you smoke while on this medication, quit again Your mouth may get dry. Chewing sugarless gum or sucking hard candy, and drinking plenty of water may help. Contact your doctor if the problem does not go away or is severe. You may get drowsy or dizzy. Do not drive, use machinery, or do anything that needs mental alertness until you know how this medicine affects you. Do not stand or sit up quickly, especially if you are an older patient. This reduces the risk of dizzy or fainting spells. Sleepwalking can happen during treatment with this medicine, and can sometimes lead to behavior that is harmful to you, other people, or property. Stop taking this medicine and tell your doctor if you start sleepwalking or have other unusual sleep-related activity. Decrease the amount of alcoholic beverages that you drink during treatment with this medicine until you know if this medicine affects your ability to tolerate alcohol. Some people have experienced increased drunkenness (intoxication), unusual or sometimes aggressive behavior, or no memory of things that have happened (amnesia) during treatment with this medicine. The use of this medicine may increase the chance of suicidal thoughts or actions. Pay special attention to how you are responding while on this medicine. Any worsening of mood, or thoughts of suicide or dying should be reported to your health care professional right away. What side effects may I notice from  receiving this medicine? Side effects that you should report to your doctor or health care professional as soon as possible: -allergic reactions like skin rash, itching or hives, swelling of the face, lips, tongue, or throat -acting aggressive, being angry or violent, or acting on dangerous impulses -breathing problems -changes in vision -chest pain or chest tightness -confusion, trouble speaking or understanding -new or worsening depression, anxiety, or panic attacks -extreme increase in activity and talking (mania) -fast, irregular heartbeat -feeling faint or lightheaded, falls -fever -pain in legs when walking -problems with balance, talking, walking -redness, blistering, peeling or loosening of the skin, including inside the mouth -ringing in ears -seeing or hearing things that aren't there (hallucinations) -seizures -sleepwalking -sudden numbness or weakness of the face, arm or leg -thoughts about suicide or dying, or attempts to commit suicide -trouble passing urine or change in the amount of urine -unusual bleeding or bruising -unusually weak or tired Side effects that usually do not require medical attention (report to your doctor or health care professional if they continue or are bothersome): -constipation -headache -nausea, vomiting -strange dreams -stomach gas -trouble sleeping This list may not describe all possible side effects. Call your doctor for medical advice about side effects. You  may report side effects to FDA at 1-800-FDA-1088. Where should I keep my medicine? Keep out of the reach of children. Store at room temperature between 15 and 30 degrees C (59 and 86 degrees F). Throw away any unused medicine after the expiration date. NOTE: This sheet is a summary. It may not cover all possible information. If you have questions about this medicine, talk to your doctor, pharmacist, or health care provider.    2016, Elsevier/Gold Standard. (2014-10-24  16:14:23)

## 2015-12-23 NOTE — Assessment & Plan Note (Addendum)
Chronic. Sig for 60 pack year hx. Pt initiated cessation and wants options. Unsuccessful with nicotine patch. Has 653 month old but does not breast feed. Does not show depressive symptoms at present. - Will try Chantix for 12 weeks, pt understands to abstain from smoking in 1 week  - Pt aware of risks and side effects of Chantix - Red flags including depression and suicide discussed with pt - 1-800-QUIT-NOW line given to pt - F/u in 1 month

## 2015-12-23 NOTE — Progress Notes (Signed)
Subjective:   Patient ID: Christine Mueller    DOB: 20-Jun-1975, 40 y.o. female   MRN: [id number]  CC: Establishing care  HPI: Christine Mueller is a 40 y.o. female who presents to clinic today to establish care. Problems discussed today are as follows:  1. Preventative Care: No previous provider. Says her new 183 month old and other family members are patients at our clinic. Wants to establish a PCP. Medical problem include smoking, COPD, asthma, and hypertension. She is up to date on her vaccinations and is interested in smoking cessation. Patient has h/o gestational diabetes.   2. Hypertension: Patient takes lisinopril 10 mg daily and tolerates well. Continues to smoke. Says she does not think she has diabetes but wants to know. Denies headaches, chest pain, or lower extremity edema.  3. Tobacco Abuse: 60 pack year smoker. Says she has cut back from 3 ppd to 1.5 ppd. Wants to quit. Has tried to quit in past with nicotine patch w/o effect. Interested in other medication options. Has newborn at house but does not smoke in house or car. Denies dyspnea, fatigue, or change in weight.  ROS: Complete ROS performed, see HPI for pertinent ROS.  PMFSH: Pertinent past medical, surgical, family, and social history were reviewed and updated as appropriate. Smoking status reviewed.  Medications reviewed. Current Outpatient Prescriptions  Medication Sig Dispense Refill  . albuterol (PROVENTIL HFA;VENTOLIN HFA) 108 (90 Base) MCG/ACT inhaler Inhale 2 puffs into the lungs every 6 (six) hours as needed for wheezing. 1 Inhaler 6  . lisinopril (PRINIVIL) 10 MG tablet Take 1 tablet (10 mg total) by mouth daily. 30 tablet 1  . varenicline (CHANTIX) 0.5 MG tablet Take 1 tab daily for three days, then  1 tab twice daily for four days, and then 2 tab twice daily for the remainder of a 12-week course. 90 tablet 1   No current facility-administered medications for this visit.     Objective:   BP (!) 142/83    Pulse 81   Temp 97.7 F (36.5 C) (Oral)   Ht 5\' 3"  (1.6 m)   Wt 135 lb 6.4 oz (61.4 kg)   LMP 11/17/2015 (Exact Date)   Breastfeeding? No   BMI 23.99 kg/m  Vitals and nursing note reviewed.  General: well nourished, well developed, in no acute distress with non-toxic appearance HEENT: normocephalic, atraumatic, moist mucous membranes Neck: supple, non-tender without lymphadenopathy CV: regular rate and rhythm without murmurs, rubs, or gallops, no lower extremity edema Lungs: clear to auscultation bilaterally with normal work of breathing Abdomen: soft, non-tender, non-distended, no masses or organomegaly palpable, normoactive bowel sounds Skin: warm, dry, no rashes or lesions, cap refill < 2 seconds Extremities: warm and well perfused, normal tone  Assessment & Plan:   Essential hypertension Controlled. BP 142/83. One ACE-I. Continues to smoke but willing to stop. A1c 5.0%. Last lipid panel was appropriate given no diabetes and weight. - Continue Lisinopril 10 mg QD - Smoking cessation material given and Chantix  Tobacco abuse Chronic. Sig for 60 pack year hx. Pt initiated cessation and wants options. Unsuccessful with nicotine patch. Has 233 month old but does not breast feed. Does not show depressive symptoms at present. - Will try Chantix for 12 weeks, pt understands to abstain from smoking in 1 week  - Pt aware of risks and side effects of Chantix - Red flags including depression and suicide discussed with pt - 1-800-QUIT-NOW line given to pt - F/u in 1 month  Encounter for screening and preventative care Establishing as new patient. Asthma seems well controlled despite still smoking. HTN well controlled on ACE-I. UTD on vaccines. A1c was wnl at 5.0%. - Flu shot given - Discussed smoking cessation extensively  Orders Placed This Encounter  Procedures  . HgB A1c   Meds ordered this encounter  Medications  . varenicline (CHANTIX) 0.5 MG tablet    Sig: Take 1 tab daily  for three days, then  1 tab twice daily for four days, and then 2 tab twice daily for the remainder of a 12-week course.    Dispense:  90 tablet    Refill:  1    Durward Parcelavid Vihana Kydd, DO [facility], PGY-1 12/23/2015 4:54 PM

## 2015-12-23 NOTE — Assessment & Plan Note (Addendum)
Controlled. BP 142/83. One ACE-I. Continues to smoke but willing to stop. A1c 5.0%. Last lipid panel was appropriate given no diabetes and weight. - Continue Lisinopril 10 mg QD - Smoking cessation material given and Chantix - Discussed limiting amount of salt intake and importance of 150 mins of exercise per week

## 2015-12-23 NOTE — Assessment & Plan Note (Addendum)
Establishing as new patient. Asthma seems well controlled despite still smoking. HTN well controlled on ACE-I. UTD on vaccines. A1c was wnl at 5.0%. - Flu shot given - Discussed smoking cessation extensively

## 2016-01-01 ENCOUNTER — Ambulatory Visit: Payer: Medicaid Other | Admitting: Family Medicine

## 2016-01-02 ENCOUNTER — Ambulatory Visit (INDEPENDENT_AMBULATORY_CARE_PROVIDER_SITE_OTHER): Payer: Medicaid Other | Admitting: Family Medicine

## 2016-01-02 ENCOUNTER — Encounter: Payer: Self-pay | Admitting: Family Medicine

## 2016-01-02 VITALS — BP 134/54 | HR 81 | Temp 98.7°F | Ht 63.0 in | Wt 134.2 lb

## 2016-01-02 DIAGNOSIS — I10 Essential (primary) hypertension: Secondary | ICD-10-CM

## 2016-01-02 DIAGNOSIS — O09521 Supervision of elderly multigravida, first trimester: Secondary | ICD-10-CM | POA: Diagnosis not present

## 2016-01-02 DIAGNOSIS — Z720 Tobacco use: Secondary | ICD-10-CM | POA: Diagnosis not present

## 2016-01-02 DIAGNOSIS — Z3201 Encounter for pregnancy test, result positive: Secondary | ICD-10-CM | POA: Diagnosis not present

## 2016-01-02 LAB — POCT URINE PREGNANCY: Preg Test, Ur: POSITIVE — AB

## 2016-01-02 MED ORDER — LABETALOL HCL 100 MG PO TABS: 100.0000 mg | ORAL_TABLET | Freq: Two times a day (BID) | ORAL | 0 refills | Status: DC

## 2016-01-02 NOTE — Progress Notes (Signed)
   Subjective: ZO:[id number]:possible pregnancy UJW:[id number]:Christine L Sharlet SalinaBenjamin is a 40 y.o. female presenting to clinic today for same day appointment. PCP: Wendee Beaversavid J McMullen, DO Concerns today include:  Positive home pregnancy test LMP 11/17/2015.  Patient has history of HTN and is on Lisinopril.  Did not take med today but has been taking over last 6 weeks consistently.  Took a home pregnancy test Monday and it was positive.  Denies nausea, vomiting, fatigue, breast tenderness.  She reports that the pregnancy was planned.  She does not have an OB.  She reports recent birth of her son in 08/2015.    Additionally, she is an active smoker.  She never started Chantix.  She plans to quit cold [place] today.  FamHx and MedHx reviewed.  Please see EMR.  ROS: Per HPI  Objective: Office vital signs reviewed. BP (!) 134/54   Pulse 81   Temp 98.7 F (37.1 C) (Oral)   Ht 5\' 3"  (1.6 m)   Wt 134 lb 3.2 oz (60.9 kg)   LMP 11/17/2015 (Exact Date)   SpO2 100%   BMI 23.77 kg/m   Physical Examination:  General: Awake, alert, well nourished, smells of tobacco. No acute distress HEENT: Normal, EOMI, sclera white Cardio: regular rate and rhythm, S1S2 heard, no murmurs appreciated Pulm: clear to auscultation bilaterally, no wheezes, rhonchi or rales MSK: Normal gait and station  Results for orders placed or performed in visit on 01/02/16 (from the past 24 hour(s))  POCT urine pregnancy     Status: Abnormal   Collection Time: 01/02/16  9:35 AM  Result Value Ref Range   Preg Test, Ur Positive (A) Negative    Assessment/ Plan: 40 y.o. female   1. Positive pregnancy test.  Confirmed here in office.  LMP 11/17/2015. Of note, short interval between pregnancies.  Last child born 08/2015.  She is overall doing well today but has significant history of HTN and tobacco use.  She is also advanced maternal age. - POCT urine pregnancy - labetalol (NORMODYNE) 100 MG tablet; Take 1 tablet (100 mg total) by mouth 2 (two) times  daily.  Dispense: 60 tablet; Refill: 0 - Referral to high risk OB.  2. Essential hypertension, controlled.  Has been on ACE-I  - STOP lisinopril - START labetalol (NORMODYNE) 100 MG tablet; Take 1 tablet (100 mg total) by mouth 2 (two) times daily.  Dispense: 60 tablet; Refill: 0  3. Elderly multigravida in first trimester  4. Tobacco abuse - SMOKING CESSATION  Raliegh IpAshly M Kyrin Gratz, DO PGY-3, [facility]

## 2016-01-02 NOTE — Patient Instructions (Addendum)
STOP taking the Lisinopril.  START taking Labetalol.  I have [facility] for [facility].  They will contact you with an appointment and will give you instruction for your labs.  STOP smoking.  Hypertension During Pregnancy Hypertension, or high blood pressure, is when there is extra pressure inside your blood vessels that carry blood from the heart to the rest of your body (arteries). It can happen at any time in life, including pregnancy. Hypertension during pregnancy can cause problems for you and your baby. Your baby might not weigh as much as he or she should at birth or might be born early (premature). Very bad cases of hypertension during pregnancy can be life-threatening.  Different types of hypertension can occur during pregnancy. These include:  Chronic hypertension. This happens when a woman has hypertension before pregnancy and it continues during pregnancy.  Gestational hypertension. This is when hypertension develops during pregnancy.  Preeclampsia or toxemia of pregnancy. This is a very serious type of hypertension that develops only during pregnancy. It affects the whole body and can be very dangerous for both mother and baby.  Gestational hypertension and preeclampsia usually go away after your baby is born. Your blood pressure will likely stabilize within 6 weeks. Women who have hypertension during pregnancy have a greater chance of developing hypertension later in life or with future pregnancies. RISK FACTORS There are certain factors that make it more likely for you to develop hypertension during pregnancy. These include:  Having hypertension before pregnancy.  Having hypertension during a previous pregnancy.  Being overweight.  Being older than 40 years.  Being pregnant with more than one baby.  Having diabetes or kidney problems. SIGNS AND SYMPTOMS Chronic and gestational hypertension rarely cause symptoms. Preeclampsia has symptoms, which may  include:  Increased protein in your urine. Your health care provider will check for this at every prenatal visit.  Swelling of your hands and face.  Rapid weight gain.  Headaches.  Visual changes.  Being bothered by light.  Abdominal pain, especially in the upper right area.  Chest pain.  Shortness of breath.  Increased reflexes.  Seizures. These occur with a more severe form of preeclampsia, called eclampsia. DIAGNOSIS  You may be diagnosed with hypertension during a regular prenatal exam. At each prenatal visit, you may have:  Your blood pressure checked.  A urine test to check for protein in your urine. The type of hypertension you are diagnosed with depends on when you developed it. It also depends on your specific blood pressure reading.  Developing hypertension before 20 weeks of pregnancy is consistent with chronic hypertension.  Developing hypertension after 20 weeks of pregnancy is consistent with gestational hypertension.  Hypertension with increased urinary protein is diagnosed as preeclampsia.  Blood pressure measurements that stay above 160 systolic or 110 diastolic are a sign of severe preeclampsia. TREATMENT Treatment for hypertension during pregnancy varies. Treatment depends on the type of hypertension and how serious it is.  If you take medicine for chronic hypertension, you may need to switch medicines.  Medicines called ACE inhibitors should not be taken during pregnancy.  Low-dose aspirin may be suggested for women who have risk factors for preeclampsia.  If you have gestational hypertension, you may need to take a blood pressure medicine that is safe during pregnancy. Your health care provider will recommend the correct medicine.  If you have severe preeclampsia, you may need to be in the hospital. Health care providers will watch you and your  baby very closely. You also may need to take medicine called magnesium sulfate to prevent seizures and  lower blood pressure.  Sometimes, an early delivery is needed. This may be the case if the condition worsens. It would be done to protect you and your baby. The only cure for preeclampsia is delivery.  Your health care provider may recommend that you take one low-dose aspirin (81 mg) each day to help prevent high blood pressure during your pregnancy if you are at risk for preeclampsia. You may be at risk for preeclampsia if:  You had preeclampsia or eclampsia during a previous pregnancy.  Your baby did not grow as expected during a previous pregnancy.  You experienced preterm birth with a previous pregnancy.  You experienced a separation of the placenta from the uterus (placental abruption) during a previous pregnancy.  You experienced the loss of your baby during a previous pregnancy.  You are pregnant with more than one baby.  You have other medical conditions, such as diabetes or an autoimmune disease. HOME CARE INSTRUCTIONS  Schedule and keep all of your regular prenatal care appointments. This is important.  Take medicines only as directed by your health care provider. Tell your health care provider about all medicines you take.  Eat as little salt as possible.  Get regular exercise.  Do not drink alcohol.  Do not use tobacco products.  Do not drink products with caffeine.  Lie on your left side when resting. SEEK IMMEDIATE MEDICAL CARE IF:  You have severe abdominal pain.  You have sudden swelling in your hands, ankles, or face.  You gain 4 pounds (1.8 kg) or more in 1 week.  You vomit repeatedly.  You have vaginal bleeding.  You do not feel your baby moving as much.  You have a headache.  You have blurred or double vision.  You have muscle twitching or spasms.  You have shortness of breath.  You have blue fingernails or lips.  You have blood in your urine. MAKE SURE YOU:  Understand these instructions.  Will watch your condition.  Will get  help right away if you are not doing well or get worse.   This information is not intended to replace advice given to you by your health care provider. Make sure you discuss any questions you have with your health care provider.   Document Released: 10/27/2010 Document Revised: 03/01/2014 Document Reviewed: 09/07/2012 Elsevier Interactive Patient Education [employer]2016 Elsevier Inc.

## 2016-01-04 ENCOUNTER — Inpatient Hospital Stay (HOSPITAL_COMMUNITY)
Admission: AD | Admit: 2016-01-04 | Discharge: 2016-01-04 | Disposition: A | Discharge status: Home / Self Care | Payer: Medicaid Other | Source: Ambulatory Visit | Attending: Obstetrics & Gynecology | Admitting: Obstetrics & Gynecology

## 2016-01-04 ENCOUNTER — Encounter (HOSPITAL_COMMUNITY): Payer: Self-pay | Admitting: *Deleted

## 2016-01-04 DIAGNOSIS — O10011 Pre-existing essential hypertension complicating pregnancy, first trimester: Secondary | ICD-10-CM | POA: Insufficient documentation

## 2016-01-04 DIAGNOSIS — O10911 Unspecified pre-existing hypertension complicating pregnancy, first trimester: Secondary | ICD-10-CM

## 2016-01-04 DIAGNOSIS — O210 Mild hyperemesis gravidarum: Secondary | ICD-10-CM | POA: Insufficient documentation

## 2016-01-04 DIAGNOSIS — Z3A01 Less than 8 weeks gestation of pregnancy: Secondary | ICD-10-CM | POA: Diagnosis not present

## 2016-01-04 DIAGNOSIS — O219 Vomiting of pregnancy, unspecified: Secondary | ICD-10-CM

## 2016-01-04 MED ORDER — PROMETHAZINE HCL 25 MG PO TABS: 25.0000 mg | ORAL_TABLET | Freq: Four times a day (QID) | ORAL | 3 refills | Status: DC | PRN

## 2016-01-04 MED ORDER — CONCEPT OB 130-92.4-1 MG PO CAPS: 1.0000 | ORAL_CAPSULE | Freq: Every day | ORAL | 12 refills | Status: DC

## 2016-01-04 NOTE — Discharge Instructions (Signed)
Morning Sickness °Morning sickness is when you feel sick to your stomach (nauseous) during pregnancy. This nauseous feeling may or may not come with vomiting. It often occurs in the morning but can be a problem any time of day. Morning sickness is most common during the first trimester, but it may continue throughout pregnancy. While morning sickness is unpleasant, it is usually harmless unless you develop severe and continual vomiting (hyperemesis gravidarum). This condition requires more intense treatment.  °CAUSES  °The cause of morning sickness is not completely known but seems to be related to normal hormonal changes that occur in pregnancy. °RISK FACTORS °You are at greater risk if you: °· Experienced nausea or vomiting before your pregnancy. °· Had morning sickness during a previous pregnancy. °· Are pregnant with more than one baby, such as twins. °TREATMENT  °Do not use any medicines (prescription, over-the-counter, or herbal) for morning sickness without first talking to your health care provider. Your health care provider may prescribe or recommend: °· Vitamin B6 supplements. °· Anti-nausea medicines. °· The herbal medicine ginger. °HOME CARE INSTRUCTIONS  °· Only take over-the-counter or prescription medicines as directed by your health care provider. °· Taking multivitamins before getting pregnant can prevent or decrease the severity of morning sickness in most women. °· Eat a piece of dry toast or unsalted crackers before getting out of bed in the morning. °· Eat five or six small meals a day. °· Eat dry and bland foods (rice, baked potato). Foods high in carbohydrates are often helpful. °· Do not drink liquids with your meals. Drink liquids between meals. °· Avoid greasy, fatty, and spicy foods. °· Get someone to cook for you if the smell of any food causes nausea and vomiting. °· If you feel nauseous after taking prenatal vitamins, take the vitamins at night or with a snack.  °· Snack on protein  foods (nuts, yogurt, cheese) between meals if you are hungry. °· Eat unsweetened gelatins for desserts. °· Wearing an acupressure wristband (worn for sea sickness) may be helpful. °· Acupuncture may be helpful. °· Do not smoke. °· Get a humidifier to keep the air in your house free of odors. °· Get plenty of fresh air. °SEEK MEDICAL CARE IF:  °· Your home remedies are not working, and you need medicine. °· You feel dizzy or lightheaded. °· You are losing weight. °SEEK IMMEDIATE MEDICAL CARE IF:  °· You have persistent and uncontrolled nausea and vomiting. °· You pass out (faint). °MAKE SURE YOU: °· Understand these instructions. °· Will watch your condition. °· Will get help right away if you are not doing well or get worse. °  °This information is not intended to replace advice given to you by your health care provider. Make sure you discuss any questions you have with your health care provider. °  °Document Released: 04/01/2006 Document Revised: 02/13/2013 Document Reviewed: 07/26/2012 °Elsevier Interactive Patient Education ©2016 Elsevier Inc. ° ° ° °Eating Plan for Hyperemesis Gravidarum °Severe cases of hyperemesis gravidarum can lead to dehydration and malnutrition. The hyperemesis eating plan is one way to lessen the symptoms of nausea and vomiting. It is often used with prescribed medicines to control your symptoms.  °WHAT CAN I DO TO RELIEVE MY SYMPTOMS? °Listen to your body. Everyone is different and has different preferences. Find what works best for you. Some of the following things may help: °· Eat and drink slowly. °· Eat 5-6 small meals daily instead of 3 large meals.   °· Eat crackers before you   get out of bed in the morning.   °· Starchy foods are usually well tolerated (such as cereal, toast, bread, potatoes, pasta, rice, and pretzels).   °· Ginger may help with nausea. Add ¼ tsp ground ginger to hot tea or choose ginger tea.   °· Try drinking 100% fruit juice or an electrolyte drink. °· Continue  to take your prenatal vitamins as directed by your health care provider. If you are having trouble taking your prenatal vitamins, talk with your health care provider about different options. °· Include at least 1 serving of protein with your meals and snacks (such as meats or poultry, beans, nuts, eggs, or yogurt). Try eating a protein-rich snack before bed (such as cheese and crackers or a half turkey or peanut butter sandwich). °WHAT THINGS SHOULD I AVOID TO REDUCE MY SYMPTOMS? °The following things may help reduce your symptoms: °· Avoid foods with strong smells. Try eating meals in well-ventilated areas that are free of odors. °· Avoid drinking water or other beverages with meals. Try not to drink anything less than 30 minutes before and after meals. °· Avoid drinking more than 1 cup of fluid at a time. °· Avoid fried or high-fat foods, such as butter and cream sauces. °· Avoid spicy foods. °· Avoid skipping meals the best you can. Nausea can be more intense on an empty stomach. If you cannot tolerate food at that time, do not force it. Try sucking on ice chips or other frozen items and make up the calories later. °· Avoid lying down within 2 hours after eating. °  °This information is not intended to replace advice given to you by your health care provider. Make sure you discuss any questions you have with your health care provider. °  °Document Released: 12/06/2006 Document Revised: 02/13/2013 Document Reviewed: 12/13/2012 °Elsevier Interactive Patient Education ©2016 Elsevier Inc. ° °

## 2016-01-04 NOTE — MAU Note (Signed)
Pt presents to MAU for pregnancy test. States she has been very nauseous. Denies any vaginal bleeding or abnormal discharge

## 2016-01-04 NOTE — MAU Provider Note (Signed)
[name] is a 40 y.o. G3P1011 at 3835w6d who presents to MAU today for pregnancy verification. The patient denies abdominal pain or vaginal bleeding today, but reports N/V several times per day. Able to keep down food and fluids. Upon review of records she was sees at [facility] on 01/01/16 for positive pregnancy test. Her BP med was switched from Lisinopril to Labetalol. Pt states she was no told about the positive pregnancy test, but was told to switch BP meds and [facility] Does not have an appt yet.    BP 134/73   Pulse 87   Temp 98.2 F (36.8 C)   Resp 18   LMP 11/17/2015 (Exact Date)   CONSTITUTIONAL: Well-developed, well-nourished female in no acute distress.  HEAD: mucus membranes moist ENT: External right and left ear normal.  EYES: EOM intact, conjunctivae normal.  MUSCULOSKELETAL: Normal range of motion.  CARDIOVASCULAR: Regular heart rate RESPIRATORY: Normal effort NEUROLOGICAL: Alert and oriented to person, place, and time.  SKIN: Skin is warm and dry. No rash noted. Not diaphoretic. No erythema. No pallor. PSYCH: Normal mood and affect. Normal behavior. Normal judgment and thought content.  No results found for this or any previous visit (from the past 24 hour(s)).  A: Positive pregnancy test 1. Nausea/vomiting in pregnancy   2. Chronic hypertension complicating or reason for care during pregnancy, first trimester    P: Discharge home Pregnancy confirmation letter and list of area OB providers given. Informed that she can go anywhere that is not [facility]  Patient advised to start taking prenatal vitamins First trimester warning signs reviewed Patient may return to MAU as needed or if her condition were to change or worsen   [place] Asuncion Tapscott, CNM  01/04/2016 3:49 PM

## 2016-01-16 ENCOUNTER — Encounter (HOSPITAL_COMMUNITY): Payer: Self-pay | Admitting: *Deleted

## 2016-01-16 ENCOUNTER — Inpatient Hospital Stay (HOSPITAL_COMMUNITY): Payer: Medicaid Other

## 2016-01-16 ENCOUNTER — Inpatient Hospital Stay (HOSPITAL_COMMUNITY)
Admission: AD | Admit: 2016-01-16 | Discharge: 2016-01-16 | Disposition: A | Discharge status: Home / Self Care | Payer: Medicaid Other | Source: Ambulatory Visit | Attending: Obstetrics & Gynecology | Admitting: Obstetrics & Gynecology

## 2016-01-16 DIAGNOSIS — Z3A01 Less than 8 weeks gestation of pregnancy: Secondary | ICD-10-CM | POA: Insufficient documentation

## 2016-01-16 DIAGNOSIS — O99331 Smoking (tobacco) complicating pregnancy, first trimester: Secondary | ICD-10-CM | POA: Insufficient documentation

## 2016-01-16 DIAGNOSIS — O09511 Supervision of elderly primigravida, first trimester: Secondary | ICD-10-CM | POA: Insufficient documentation

## 2016-01-16 DIAGNOSIS — Z880 Allergy status to penicillin: Secondary | ICD-10-CM | POA: Diagnosis not present

## 2016-01-16 DIAGNOSIS — N939 Abnormal uterine and vaginal bleeding, unspecified: Secondary | ICD-10-CM | POA: Diagnosis not present

## 2016-01-16 DIAGNOSIS — J449 Chronic obstructive pulmonary disease, unspecified: Secondary | ICD-10-CM | POA: Diagnosis not present

## 2016-01-16 DIAGNOSIS — O161 Unspecified maternal hypertension, first trimester: Secondary | ICD-10-CM | POA: Insufficient documentation

## 2016-01-16 DIAGNOSIS — F17210 Nicotine dependence, cigarettes, uncomplicated: Secondary | ICD-10-CM | POA: Insufficient documentation

## 2016-01-16 DIAGNOSIS — O209 Hemorrhage in early pregnancy, unspecified: Secondary | ICD-10-CM | POA: Insufficient documentation

## 2016-01-16 DIAGNOSIS — O99511 Diseases of the respiratory system complicating pregnancy, first trimester: Secondary | ICD-10-CM | POA: Diagnosis not present

## 2016-01-16 LAB — CBC
HCT: 35.9 % — ABNORMAL LOW (ref 36.0–46.0)
Hemoglobin: 12.5 g/dL (ref 12.0–15.0)
MCH: 31.4 pg (ref 26.0–34.0)
MCHC: 34.8 g/dL (ref 30.0–36.0)
MCV: 90.2 fL (ref 78.0–100.0)
PLATELETS: 291 10*3/uL (ref 150–400)
RBC: 3.98 MIL/uL (ref 3.87–5.11)
RDW: 12.9 % (ref 11.5–15.5)
WBC: 7.6 10*3/uL (ref 4.0–10.5)

## 2016-01-16 LAB — URINALYSIS, ROUTINE W REFLEX MICROSCOPIC
Bilirubin Urine: NEGATIVE
Glucose, UA: NEGATIVE mg/dL
Ketones, ur: NEGATIVE mg/dL
Leukocytes, UA: NEGATIVE
Nitrite: NEGATIVE
PROTEIN: NEGATIVE mg/dL
Specific Gravity, Urine: 1.01 (ref 1.005–1.030)
pH: 7 (ref 5.0–8.0)

## 2016-01-16 LAB — WET PREP, GENITAL
Sperm: NONE SEEN
TRICH WET PREP: NONE SEEN
YEAST WET PREP: NONE SEEN

## 2016-01-16 LAB — URINE MICROSCOPIC-ADD ON

## 2016-01-16 LAB — HCG, QUANTITATIVE, PREGNANCY: hCG, Beta Chain, Quant, S: 37509 m[IU]/mL — ABNORMAL HIGH (ref <5)

## 2016-01-16 MED ORDER — METRONIDAZOLE 500 MG PO TABS: 500.0000 mg | ORAL_TABLET | Freq: Three times a day (TID) | ORAL | 0 refills | Status: AC

## 2016-01-16 MED ORDER — LABETALOL HCL 100 MG PO TABS: 100.0000 mg | ORAL_TABLET | Freq: Two times a day (BID) | ORAL | 2 refills | Status: DC

## 2016-01-16 NOTE — Discharge Instructions (Signed)

## 2016-01-16 NOTE — MAU Provider Note (Signed)
History     CSN: [id number]  Arrival date and time: 01/16/16 1148   None     Chief Complaint  Patient presents with  . Vaginal Bleeding   Vaginal Bleeding  The patient's primary symptoms include vaginal bleeding. The patient's pertinent negatives include no genital itching, genital lesions, genital odor, genital rash, missed menses, pelvic pain or vaginal discharge. This is a new problem. The current episode started today. The problem occurs 2 to 4 times per day. The problem has been unchanged. The patient is experiencing no pain. She is pregnant. Pertinent negatives include no abdominal pain, anorexia, back pain, chills, constipation, diarrhea, discolored urine, dysuria, fever, flank pain, frequency, headaches, hematuria, joint pain, joint swelling, nausea, painful intercourse, rash, sore throat, urgency or vomiting. The vaginal discharge was normal. The vaginal bleeding is spotting. She has not been passing clots. She has not been passing tissue. Nothing aggravates the symptoms. She has tried nothing for the symptoms.   Patient Christine Mueller is a [age] year old G3P1011 here with complaints of vaginal bleeding that started this morning. She denies cramping, pain with urination, or odor.  OB History    Gravida Para Term Preterm AB Living   3 1 1   1 1    SAB TAB Ectopic Multiple Live Births   1     0 1      Past Medical History:  Diagnosis Date  . Asthma   . COPD (chronic obstructive pulmonary disease) (HCC)   . Gestational diabetes    glyburide  . Hypertension     Past Surgical History:  Procedure Laterality Date  . CESAREAN SECTION N/A 09/04/2015   Procedure: CESAREAN SECTION;  Surgeon: Lazaro ArmsLuther H Eure, MD;  [facility];  Service: Obstetrics;  Laterality: N/A;  . wisdom teeth removal      Family History  Problem Relation Age of Onset  . Diabetes Mother   . Hypertension Mother     Social History  Substance Use Topics  . Smoking status: Current Every Day  Smoker    Packs/day: 1.50    Years: 30.00    Types: Cigarettes  . Smokeless tobacco: Never Used     Comment: Discussed need to quit for baby, asthma and COPD. Information given  . Alcohol use No    Allergies:  Allergies  Allergen Reactions  . Penicillins Swelling, Rash and Other (See Comments)    Reaction:  Facial swelling  Has patient had a PCN reaction causing immediate rash, facial/tongue/throat swelling, SOB or lightheadedness with hypotension: Yes Has patient had a PCN reaction causing severe rash involving mucus membranes or skin necrosis: No Has patient had a PCN reaction that required hospitalization No Has patient had a PCN reaction occurring within the last 10 years: No If all of the above answers are "NO", then may proceed with Cephalosporin use.     Prescriptions Prior to Admission  Medication Sig Dispense Refill Last Dose  . albuterol (PROVENTIL HFA;VENTOLIN HFA) 108 (90 Base) MCG/ACT inhaler Inhale 2 puffs into the lungs every 6 (six) hours as needed for wheezing. 1 Inhaler 6 Past Month at Unknown time  . Prenat w/o A Vit-FeFum-FePo-FA (CONCEPT OB) 130-92.4-1 MG CAPS Take 1 capsule by mouth daily.   01/15/2016 at Unknown time  . promethazine (PHENERGAN) 25 MG tablet Take 25 mg by mouth every 6 (six) hours as needed for nausea or vomiting.   Past Month at Unknown time  . labetalol (NORMODYNE) 100 MG tablet Take 1 tablet (100  mg total) by mouth 2 (two) times daily. (Patient not taking: Reported on 01/16/2016) 60 tablet 0 Not Taking at Unknown time    Review of Systems  Constitutional: Negative.  Negative for chills and fever.  HENT: Negative.  Negative for sore throat.   Eyes: Negative.   Cardiovascular: Negative.   Gastrointestinal: Negative.  Negative for abdominal pain, anorexia, constipation, diarrhea, nausea and vomiting.  Genitourinary: Positive for vaginal bleeding. Negative for dysuria, flank pain, frequency, hematuria, missed menses, pelvic pain, urgency and  vaginal discharge.  Musculoskeletal: Negative.  Negative for back pain and joint pain.  Skin: Negative for rash.  Neurological: Negative for headaches.  Endo/Heme/Allergies: Negative.    Physical Exam   Blood pressure 147/85, pulse 92, temperature 98.2 F (36.8 C), temperature source Oral, resp. rate 16, height 5\' 3"  (1.6 m), weight 61.2 kg (135 lb), last menstrual period 11/17/2015, not currently breastfeeding.  Physical Exam  Constitutional: She is oriented to person, place, and time. She appears well-developed.  Neck: Normal range of motion.  Respiratory: Effort normal.  GI: Soft. She exhibits no distension and no mass. There is no tenderness. There is no rebound and no guarding.  Genitourinary: Vagina normal and uterus normal.  Genitourinary Comments: No lesions seen on Labia and vulva. Vaginal walls are pink with no lesions or adherent discharge. Small amount of blood in the posterior fornix. Cervix is pink, non erythematous with no lesions. No CMT. Uterine Size equals dates on bimanual.   Musculoskeletal: Normal range of motion.  Neurological: She is alert and oriented to person, place, and time.  Skin: Skin is warm and dry.    MAU Course  Procedures  MDM US: shows SIUP at 6 w and 1 day. Heart rate is 89.  Beta: 37,000 Wet prep-clue cells present  Assessment and Plan  Patient Christine Mueller is a 40 year old G3P1011 here with complaints of vaginal bleeding that started this morning. Has not had intercourse in the past 24 hours. US shows SIUP at 6 w and 1 days; fetal heart rate is low (89 bpm) and follow up US is recommended for two weeks from now. She has not yet scheduled a prenatal appointment with the [facility] so US and appointment for results has been ordered.  She is a chronic hyptertensive who was just switched to labetelol from lisinopril but has not yet fi9lled her prescription. Patient to be discharged with labetelol prescription and prescription for flagyl.   Christine GaribaldiKathryn  Lorraine Trayce Mueller CNM 01/16/2016, 2:57 PM

## 2016-01-16 NOTE — Progress Notes (Signed)
Patient called second time, not in lobby.

## 2016-01-16 NOTE — MAU Note (Signed)
Pt states she started spotting during the night, felt like she was cramping this morning, not as much now.

## 2016-01-16 NOTE — MAU Note (Signed)
Pt called, not in lobby 

## 2016-01-17 LAB — HIV ANTIBODY (ROUTINE TESTING W REFLEX): HIV SCREEN 4TH GENERATION: NONREACTIVE

## 2016-01-21 LAB — GC/CHLAMYDIA PROBE AMP (~~LOC~~) NOT AT ARMC
CHLAMYDIA, DNA PROBE: NEGATIVE
Neisseria Gonorrhea: NEGATIVE

## 2016-01-28 ENCOUNTER — Ambulatory Visit (INDEPENDENT_AMBULATORY_CARE_PROVIDER_SITE_OTHER): Payer: Medicaid Other | Admitting: Obstetrics & Gynecology

## 2016-01-28 ENCOUNTER — Encounter: Payer: Self-pay | Admitting: Obstetrics & Gynecology

## 2016-01-28 VITALS — BP 135/87 | HR 81 | Wt 135.4 lb

## 2016-01-28 DIAGNOSIS — O209 Hemorrhage in early pregnancy, unspecified: Secondary | ICD-10-CM | POA: Diagnosis not present

## 2016-01-28 NOTE — Progress Notes (Signed)
Patient is concerned about her spotting.

## 2016-01-28 NOTE — Progress Notes (Signed)
Subjective:     Patient ID: Christine Mueller, female   DOB: June 18, 1975, 40 y.o.   MRN: [id number] Cc: spotting in pregnancy HPI G3P1011 Patient's last menstrual period was 11/17/2015 (exact date). 5155w6d Spotting daily for 2 weeks and some cramps. US at 6 weeks showed viable IUP.  Review of Systems  Constitutional: Negative.   Gastrointestinal: Negative for nausea.  Genitourinary: Positive for pelvic pain and vaginal bleeding.       Objective:   Physical Exam  Constitutional: She appears well-developed. No distress.  Genitourinary: Vagina normal.  dark blood, cervix normal and uterus 6 weeks Blood pressure 135/87, pulse 81, weight 135 lb 6.4 oz (61.4 kg), last menstrual period 11/17/2015, not currently breastfeeding.     Assessment:     First trimester bleeding H/O GDM with nl 2 hr GTT 3 mo ago postpartum    Plan:     [facility] and return afterward for result and establish care if no complications Prenatal panel not done today  Adam PhenixJames G Imir Brumbach, MD 01/28/2016

## 2016-01-29 ENCOUNTER — Encounter: Payer: Self-pay | Admitting: Obstetrics & Gynecology

## 2016-01-30 ENCOUNTER — Ambulatory Visit (HOSPITAL_COMMUNITY)
Admission: RE | Admit: 2016-01-30 | Discharge: 2016-01-30 | Disposition: A | Discharge status: Home / Self Care | Payer: Medicaid Other | Source: Ambulatory Visit | Attending: Advanced Practice Midwife | Admitting: Advanced Practice Midwife

## 2016-01-30 ENCOUNTER — Encounter: Payer: Self-pay | Admitting: Advanced Practice Midwife

## 2016-01-30 ENCOUNTER — Other Ambulatory Visit: Payer: Self-pay | Admitting: Advanced Practice Midwife

## 2016-01-30 DIAGNOSIS — Z3A01 Less than 8 weeks gestation of pregnancy: Secondary | ICD-10-CM | POA: Insufficient documentation

## 2016-01-30 DIAGNOSIS — O209 Hemorrhage in early pregnancy, unspecified: Secondary | ICD-10-CM | POA: Diagnosis present

## 2016-01-30 MED ORDER — MISOPROSTOL 200 MCG PO TABS: ORAL_TABLET | ORAL | 1 refills | Status: DC

## 2016-01-30 MED ORDER — PROMETHAZINE HCL 25 MG PO TABS: 25.0000 mg | ORAL_TABLET | Freq: Four times a day (QID) | ORAL | 2 refills | Status: DC | PRN

## 2016-01-30 MED ORDER — OXYCODONE-ACETAMINOPHEN 5-325 MG PO TABS: 1.0000 | ORAL_TABLET | Freq: Four times a day (QID) | ORAL | 0 refills | Status: DC | PRN

## 2016-01-30 NOTE — Progress Notes (Signed)
Ultrasounds Results Note  SUBJECTIVE HPI:  [name] is a 40 y.o. G3P1011 at [redacted]w[redacted]d by LMP who presents to the [facility] for followup ultrasound results. The patient denies abdominal pain or vaginal bleeding.  Upon review of the patient's records, patient was first seen in MAU on 01/16/16 for bleeding.   BHCG on that day was 40981.  Ultrasound showed early pregnancy [redacted]w[redacted]d with HR 89, .     Past Medical History:  Diagnosis Date  . Asthma   . COPD (chronic obstructive pulmonary disease) (HCC)   . Gestational diabetes   . Hypertension    Past Surgical History:  Procedure Laterality Date  . CESAREAN SECTION N/A 09/04/2015   Procedure: CESAREAN SECTION;  Surgeon: Lazaro Arms, MD;  Location: [facility] BIRTHING SUITES;  Service: Obstetrics;  Laterality: N/A;  . wisdom teeth removal     Social History   Social History  . Marital status: Single    Spouse name: N/A  . Number of children: N/A  . Years of education: N/A   Occupational History  . Not on file.   Social History Main Topics  . Smoking status: Current Every Day Smoker    Packs/day: 1.00    Years: 30.00    Types: Cigarettes  . Smokeless tobacco: Never Used     Comment: Discussed need to quit for baby, asthma and COPD. Information given  . Alcohol use No  . Drug use:     Types: Marijuana     Comment: patient has stopped  . Sexual activity: Yes    Birth control/ protection: None   Other Topics Concern  . Not on file   Social History Narrative  . No narrative on file   Current Outpatient Prescriptions on File Prior to Visit  Medication Sig Dispense Refill  . albuterol (PROVENTIL HFA;VENTOLIN HFA) 108 (90 Base) MCG/ACT inhaler Inhale 2 puffs into the lungs every 6 (six) hours as needed for wheezing. 1 Inhaler 6  . labetalol (NORMODYNE) 100 MG tablet Take 1 tablet (100 mg total) by mouth 2 (two) times daily. 60 tablet 2  . Prenat w/o A Vit-FeFum-FePo-FA (CONCEPT OB) 130-92.4-1 MG CAPS Take 1 capsule  by mouth daily.    . promethazine (PHENERGAN) 25 MG tablet Take 25 mg by mouth every 6 (six) hours as needed for nausea or vomiting.     No current facility-administered medications on file prior to visit.    Allergies  Allergen Reactions  . Penicillins Swelling, Rash and Other (See Comments)    Reaction:  Facial swelling  Has patient had a PCN reaction causing immediate rash, facial/tongue/throat swelling, SOB or lightheadedness with hypotension: Yes Has patient had a PCN reaction causing severe rash involving mucus membranes or skin necrosis: No Has patient had a PCN reaction that required hospitalization No Has patient had a PCN reaction occurring within the last 10 years: No If all of the above answers are "NO", then may proceed with Cephalosporin use.     I have reviewed patient's Past Medical Hx, Surgical Hx, Family Hx, Social Hx, medications and allergies.   Review of Systems Review of Systems  Constitutional: Negative for fever and chills.  Gastrointestinal: Negative for nausea, vomiting, abdominal pain, diarrhea and constipation.  Genitourinary: Negative for dysuria.  Musculoskeletal: Negative for back pain.  Neurological: Negative for dizziness and weakness.    Physical Exam  LMP 11/17/2015 (Exact Date)   GENERAL: Well-developed, well-nourished female in no acute distress.  HEENT: Normocephalic, atraumatic.  LUNGS: Effort normal ABDOMEN: soft, non-tender HEART: Regular rate  SKIN: Warm, dry and without erythema PSYCH: Normal mood and affect NEURO: Alert and oriented x 4  LAB RESULTS No results found for this or any previous visit (from the past 24 hour(s)).  IMAGING [place] Ob Comp Less 14 Wks  Result Date: 01/16/2016 CLINICAL DATA:  Spotting and pain in early pregnancy EXAM: OBSTETRIC <14 WK US AND TRANSVAGINAL OB US TECHNIQUE: Both transabdominal and transvaginal ultrasound examinations were performed for complete evaluation of the gestation as well as the  maternal uterus, adnexal regions, and pelvic cul-de-sac. Transvaginal technique was performed to assess early pregnancy. COMPARISON:  None. FINDINGS: Intrauterine gestational sac: Single Yolk sac:  Visualized Embryo:  Visualized Cardiac Activity: Visualized Heart Rate: 89  bpm MSD:   mm    w     d CRL:  5  mm   6 w   1 d                  US EDC: 09/09/2016 Subchorionic hemorrhage:  None visualized. Maternal uterus/adnexae: No adnexal masses. Trace free fluid in the pelvis. IMPRESSION: Single intrauterine pregnancy, 6 weeks 1 day by crown-rump length. Fetal heart rate 89 beats per minute. This low heart rate may be related to early pregnancy. Recommend follow-up ultrasound in 2 weeks to ensure expected progression. No acute maternal findings. Electronically Signed   By: Charlett NoseKevin  Dover M.D.   On: 01/16/2016 13:33   [place] Ob Transvaginal  Result Date: 01/30/2016 CLINICAL DATA:  Pregnant, bleeding EXAM: TRANSVAGINAL OB ULTRASOUND TECHNIQUE: Transvaginal ultrasound was performed for complete evaluation of the gestation as well as the maternal uterus, adnexal regions, and pelvic cul-de-sac. COMPARISON:  01/16/2016 FINDINGS: Intrauterine gestational sac: Single, mildly irregular Yolk sac:  Visualized. Embryo:  Visualized. Cardiac Activity: Not Visualized. CRL:   3.0  mm   5 w 6 d When compared to the prior study, there has been no interval growth. Subchorionic hemorrhage: Small subchronic hemorrhage, measuring 2.1 x 2.0 x 2.4 cm. Maternal uterus/adnexae: Bilateral ovaries are poorly visualized due to bowel. No free fluid. IMPRESSION: Single intrauterine gestational sac, measuring 5 weeks 6 days by crown-rump length, without cardiac activity. Technically speaking, this is suspicious but not yet definitive for failed pregnancy. However, given the lack of interval growth from prior ultrasound, this is considered compatible with failed pregnancy. Correlate with beta HCG, as clinically warranted. These results were called by  telephone at the time of interpretation on 01/30/2016 at 1:45 pm to a NP in the MAU, who will convey the results to the referring physician, Wynelle BourgeoisMarie Lancer Thurner. Electronically Signed   By: Charline BillsSriyesh  Krishnan M.D.   On: 01/30/2016 13:46   [place] Ob Transvaginal  Result Date: 01/16/2016 CLINICAL DATA:  Spotting and pain in early pregnancy EXAM: OBSTETRIC <14 WK US AND TRANSVAGINAL OB US TECHNIQUE: Both transabdominal and transvaginal ultrasound examinations were performed for complete evaluation of the gestation as well as the maternal uterus, adnexal regions, and pelvic cul-de-sac. Transvaginal technique was performed to assess early pregnancy. COMPARISON:  None. FINDINGS: Intrauterine gestational sac: Single Yolk sac:  Visualized Embryo:  Visualized Cardiac Activity: Visualized Heart Rate: 89  bpm MSD:   mm    w     d CRL:  5  mm   6 w   1 d                  US EDC: 09/09/2016 Subchorionic hemorrhage:  None visualized. Maternal uterus/adnexae: No adnexal masses.  Trace free fluid in the pelvis. IMPRESSION: Single intrauterine pregnancy, 6 weeks 1 day by crown-rump length. Fetal heart rate 89 beats per minute. This low heart rate may be related to early pregnancy. Recommend follow-up ultrasound in 2 weeks to ensure expected progression. No acute maternal findings. Electronically Signed   By: Charlett NoseKevin  Dover M.D.   On: 01/16/2016 13:33    ASSESSMENT Missed abortion  PLAN Discharge home in stable condition Discussed options   Patient elects to use Cytotec Rx given for that as well as Percocet and Phenergan Follow up in clinic with Quant next week  Aviva SignsMarie L Telvin Reinders, CNM  01/30/2016  2:07 PM

## 2016-11-08 ENCOUNTER — Encounter (HOSPITAL_COMMUNITY): Payer: Self-pay

## 2016-12-28 ENCOUNTER — Ambulatory Visit: Payer: Medicaid Other

## 2016-12-28 ENCOUNTER — Encounter: Payer: Self-pay | Admitting: Family Medicine

## 2016-12-28 DIAGNOSIS — Z3201 Encounter for pregnancy test, result positive: Secondary | ICD-10-CM

## 2016-12-28 NOTE — Addendum Note (Signed)
Addended by: Faythe CasaBELLAMY, Yarely Bebee M on: 12/28/2016 05:11 PM   Modules accepted: Level of Service

## 2016-12-28 NOTE — Progress Notes (Signed)
Pt here for pregnancy test.  Resulted positive.  Pt reports LMP 11/24/16.  EDD 08/31/17.  Pt given list of medications safe to take in pregnancy.  Proof of pregnancy letter given by the front office.

## 2016-12-29 LAB — POCT PREGNANCY, URINE: Preg Test, Ur: POSITIVE — AB

## 2016-12-29 NOTE — Progress Notes (Signed)
I agree with the nurses note and plan of care.    Duane Lopeasch, Rasheena Talmadge I, NP 12/29/2016 10:43 AM

## 2016-12-30 ENCOUNTER — Inpatient Hospital Stay (HOSPITAL_COMMUNITY)
Admission: AD | Admit: 2016-12-30 | Discharge: 2016-12-30 | Discharge status: Against Medical Advice | Payer: Self-pay | Source: Ambulatory Visit | Attending: Obstetrics & Gynecology | Admitting: Obstetrics & Gynecology

## 2016-12-30 NOTE — MAU Note (Signed)
Informed secretary she was leaving and would come back later

## 2016-12-30 NOTE — MAU Note (Signed)
Not in lobby x1  

## 2016-12-30 NOTE — MAU Note (Signed)
Not in lobby x2.

## 2016-12-31 ENCOUNTER — Encounter (HOSPITAL_COMMUNITY): Payer: Self-pay

## 2016-12-31 ENCOUNTER — Inpatient Hospital Stay (HOSPITAL_COMMUNITY): Payer: Medicaid Other

## 2016-12-31 ENCOUNTER — Other Ambulatory Visit: Payer: Self-pay

## 2016-12-31 ENCOUNTER — Inpatient Hospital Stay (HOSPITAL_COMMUNITY)
Admission: AD | Admit: 2016-12-31 | Discharge: 2016-12-31 | Disposition: A | Discharge status: Home / Self Care | Payer: Medicaid Other | Source: Ambulatory Visit | Attending: Obstetrics & Gynecology | Admitting: Obstetrics & Gynecology

## 2016-12-31 DIAGNOSIS — Z3A01 Less than 8 weeks gestation of pregnancy: Secondary | ICD-10-CM | POA: Diagnosis not present

## 2016-12-31 DIAGNOSIS — R109 Unspecified abdominal pain: Secondary | ICD-10-CM

## 2016-12-31 DIAGNOSIS — F17210 Nicotine dependence, cigarettes, uncomplicated: Secondary | ICD-10-CM | POA: Insufficient documentation

## 2016-12-31 DIAGNOSIS — O99331 Smoking (tobacco) complicating pregnancy, first trimester: Secondary | ICD-10-CM | POA: Diagnosis not present

## 2016-12-31 DIAGNOSIS — O209 Hemorrhage in early pregnancy, unspecified: Secondary | ICD-10-CM

## 2016-12-31 DIAGNOSIS — O26851 Spotting complicating pregnancy, first trimester: Secondary | ICD-10-CM | POA: Diagnosis not present

## 2016-12-31 DIAGNOSIS — Z3490 Encounter for supervision of normal pregnancy, unspecified, unspecified trimester: Secondary | ICD-10-CM

## 2016-12-31 DIAGNOSIS — O26891 Other specified pregnancy related conditions, first trimester: Secondary | ICD-10-CM

## 2016-12-31 DIAGNOSIS — Z880 Allergy status to penicillin: Secondary | ICD-10-CM | POA: Insufficient documentation

## 2016-12-31 DIAGNOSIS — O98311 Other infections with a predominantly sexual mode of transmission complicating pregnancy, first trimester: Secondary | ICD-10-CM | POA: Diagnosis not present

## 2016-12-31 DIAGNOSIS — A599 Trichomoniasis, unspecified: Secondary | ICD-10-CM

## 2016-12-31 DIAGNOSIS — A5901 Trichomonal vulvovaginitis: Secondary | ICD-10-CM | POA: Insufficient documentation

## 2016-12-31 LAB — URINALYSIS, ROUTINE W REFLEX MICROSCOPIC
Bilirubin Urine: NEGATIVE
GLUCOSE, UA: NEGATIVE mg/dL
Ketones, ur: NEGATIVE mg/dL
LEUKOCYTES UA: NEGATIVE
Nitrite: NEGATIVE
Protein, ur: NEGATIVE mg/dL
SPECIFIC GRAVITY, URINE: 1.025 (ref 1.005–1.030)
pH: 5.5 (ref 5.0–8.0)

## 2016-12-31 LAB — URINALYSIS, MICROSCOPIC (REFLEX)

## 2016-12-31 LAB — WET PREP, GENITAL
Clue Cells Wet Prep HPF POC: NONE SEEN
SPERM: NONE SEEN
YEAST WET PREP: NONE SEEN

## 2016-12-31 LAB — CBC
HEMATOCRIT: 39.1 % (ref 36.0–46.0)
HEMOGLOBIN: 13.3 g/dL (ref 12.0–15.0)
MCH: 31.7 pg (ref 26.0–34.0)
MCHC: 34 g/dL (ref 30.0–36.0)
MCV: 93.1 fL (ref 78.0–100.0)
Platelets: 266 10*3/uL (ref 150–400)
RBC: 4.2 MIL/uL (ref 3.87–5.11)
RDW: 12.2 % (ref 11.5–15.5)
WBC: 6.8 10*3/uL (ref 4.0–10.5)

## 2016-12-31 LAB — HCG, QUANTITATIVE, PREGNANCY: hCG, Beta Chain, Quant, S: 3404 m[IU]/mL — ABNORMAL HIGH (ref <5)

## 2016-12-31 MED ORDER — LABETALOL HCL 100 MG PO TABS: 200.0000 mg | ORAL_TABLET | Freq: Two times a day (BID) | ORAL | 2 refills | Status: DC

## 2016-12-31 NOTE — MAU Provider Note (Signed)
Chief Complaint: Vaginal Bleeding and Abdominal Pain   SUBJECTIVE HPI: Christine Mueller is a 41 y.o. G4P1011 at Unknown who presents to MAU reporting abdominal pain and vaginal spotting. She states that after she got off work today she noticed 2 spots o flight pink blood in her underwear. She does not endorse abdominal pain currently but states that when she was here to be evaluated earlier in day she was having abdominal pain/cramping. She felt like something was "moving". Based off patient's LMP of 11/24/16 she has an EDD of 08/31/17 making her 9474w2d. However, patient believes she is further along than this. She denies fever, vaginal discharge, n/v.   Past Medical History:  Diagnosis Date  . Asthma   . COPD (chronic obstructive pulmonary disease) (HCC)   . Gestational diabetes   . Hypertension    OB History  Gravida Para Term Preterm AB Living  4 1 1   1 1   SAB TAB Ectopic Multiple Live Births  1     0 1    # Outcome Date GA Lbr Len/2nd Weight Sex Delivery Anes PTL Lv  4 Current           3 Term 09/04/15 6479w2d  3.16 kg (6 lb 15.5 oz) M CS-LTranv EPI  LIV  2 SAB 2008 5665w0d         1 Gravida             Obstetric Comments  Water broke- infant stress- caught in pelvis- nonprogression   Past Surgical History:  Procedure Laterality Date  . wisdom teeth removal     Social History   Socioeconomic History  . Marital status: Single    Spouse name: Not on file  . Number of children: Not on file  . Years of education: Not on file  . Highest education level: Not on file  Social Needs  . Financial resource strain: Not on file  . Food insecurity - worry: Not on file  . Food insecurity - inability: Not on file  . Transportation needs - medical: Not on file  . Transportation needs - non-medical: Not on file  Occupational History  . Not on file  Tobacco Use  . Smoking status: Current Every Day Smoker    Packs/day: 1.00    Years: 30.00    Pack years: 30.00    Types: Cigarettes  .  Smokeless tobacco: Never Used  . Tobacco comment: Discussed need to quit for baby, asthma and COPD. Information given  Substance and Sexual Activity  . Alcohol use: No  . Drug use: Yes    Types: Marijuana    Comment: patient has stopped  . Sexual activity: Yes    Birth control/protection: None  Other Topics Concern  . Not on file  Social History Narrative  . Not on file   No current facility-administered medications on file prior to encounter.    Current Outpatient Medications on File Prior to Encounter  Medication Sig Dispense Refill  . labetalol (NORMODYNE) 100 MG tablet Take 1 tablet (100 mg total) by mouth 2 (two) times daily. (Patient not taking: Reported on 12/28/2016) 60 tablet 2  . misoprostol (CYTOTEC) 200 MCG tablet Place four tablets in between your gums and cheeks (two tablets on each side) as instructed OR insert four tablets vaginally (Patient not taking: Reported on 12/28/2016) 4 tablet 1  . oxyCODONE-acetaminophen (PERCOCET/ROXICET) 5-325 MG tablet Take 1-2 tablets by mouth every 6 (six) hours as needed. (Patient not taking: Reported on 12/28/2016)  20 tablet 0  . Prenat w/o A Vit-FeFum-FePo-FA (CONCEPT OB) 130-92.4-1 MG CAPS Take 1 capsule by mouth daily.    . promethazine (PHENERGAN) 25 MG tablet Take 25 mg by mouth every 6 (six) hours as needed for nausea or vomiting.    . promethazine (PHENERGAN) 25 MG tablet Take 1 tablet (25 mg total) by mouth every 6 (six) hours as needed for nausea or vomiting. (Patient not taking: Reported on 12/28/2016) 30 tablet 2   Allergies  Allergen Reactions  . Penicillins Swelling, Rash and Other (See Comments)    Reaction:  Facial swelling  Has patient had a PCN reaction causing immediate rash, facial/tongue/throat swelling, SOB or lightheadedness with hypotension: Yes Has patient had a PCN reaction causing severe rash involving mucus membranes or skin necrosis: No Has patient had a PCN reaction that required hospitalization No Has patient  had a PCN reaction occurring within the last 10 years: No If all of the above answers are "NO", then may proceed with Cephalosporin use.     I have reviewed the past Medical Hx, Surgical Hx, Social Hx, Allergies and Medications.   REVIEW OF SYSTEMS All systems reviewed and are negative for acute change except as noted in the HPI.   OBJECTIVE BP (!) 153/92 (BP Location: Right Arm)   Pulse 75   Temp 98.6 F (37 C) (Oral)   Resp 18   Ht 5\' 3"  (1.6 m)   Wt 59.8 kg (131 lb 14.4 oz)   BMI 23.37 kg/m    PHYSICAL EXAM Constitutional: Well-developed, well-nourished female in no acute distress.  Cardiovascular: normal rate and rhythm, pulses intact Respiratory: normal rate and effort.  GI: Abd soft, non-tender, distended. Fundal height palpated below umbilicus.  MS: Extremities nontender, no edema, normal ROM Neurologic: Alert and oriented x 4. No focal deficits SPECULUM EXAM: NEFG, physiologic discharge, no blood noted, cervix clean BIMANUAL: uterus enlarged, no adnexal tenderness or masses. No CMT. Psych: normal mood and affect  LAB RESULTS Results for orders placed or performed during the hospital encounter of 12/31/16 (from the past 24 hour(s))  Urinalysis, Routine w reflex microscopic     Status: Abnormal   Collection Time: 12/31/16 12:56 AM  Result Value Ref Range   Color, Urine YELLOW YELLOW   APPearance CLEAR CLEAR   Specific Gravity, Urine 1.025 1.005 - 1.030   pH 5.5 5.0 - 8.0   Glucose, UA NEGATIVE NEGATIVE mg/dL   Hgb urine dipstick TRACE (A) NEGATIVE   Bilirubin Urine NEGATIVE NEGATIVE   Ketones, ur NEGATIVE NEGATIVE mg/dL   Protein, ur NEGATIVE NEGATIVE mg/dL   Nitrite NEGATIVE NEGATIVE   Leukocytes, UA NEGATIVE NEGATIVE  Urinalysis, Microscopic (reflex)     Status: Abnormal   Collection Time: 12/31/16 12:56 AM  Result Value Ref Range   RBC / HPF 0-5 0 - 5 RBC/hpf   WBC, UA 0-5 0 - 5 WBC/hpf   Bacteria, UA RARE (A) NONE SEEN   Squamous Epithelial / LPF 0-5  (A) NONE SEEN   Mucus PRESENT   Wet prep, genital     Status: Abnormal   Collection Time: 12/31/16  1:15 AM  Result Value Ref Range   Yeast Wet Prep HPF POC NONE SEEN NONE SEEN   Trich, Wet Prep PRESENT (A) NONE SEEN   Clue Cells Wet Prep HPF POC NONE SEEN NONE SEEN   WBC, Wet Prep HPF POC FEW (A) NONE SEEN   Sperm NONE SEEN   CBC     Status: None  Collection Time: 12/31/16  1:23 AM  Result Value Ref Range   WBC 6.8 4.0 - 10.5 K/uL   RBC 4.20 3.87 - 5.11 MIL/uL   Hemoglobin 13.3 12.0 - 15.0 g/dL   HCT 16.1 09.6 - 04.5 %   MCV 93.1 78.0 - 100.0 fL   MCH 31.7 26.0 - 34.0 pg   MCHC 34.0 30.0 - 36.0 g/dL   RDW 40.9 81.1 - 91.4 %   Platelets 266 150 - 400 K/uL  hCG, quantitative, pregnancy     Status: Abnormal   Collection Time: 12/31/16  1:23 AM  Result Value Ref Range   hCG, Beta Chain, Quant, S 3,404 (H) <5 mIU/mL    IMAGING US Ob Comp Less 14 Wks  Result Date: 12/31/2016 CLINICAL DATA:  Bleeding and pain EXAM: OBSTETRIC <14 WK [place] AND TRANSVAGINAL OB US TECHNIQUE: Both transabdominal and transvaginal ultrasound examinations were performed for complete evaluation of the gestation as well as the maternal uterus, adnexal regions, and pelvic cul-de-sac. Transvaginal technique was performed to assess early pregnancy. COMPARISON:  None. FINDINGS: Intrauterine gestational sac: Probable intrauterine gestational sac Yolk sac:  Not visible Embryo:  Not visible Cardiac Activity: Not visible MSD: 5.5  mm   5 w   2  d Subchorionic hemorrhage:  None visualized. Maternal uterus/adnexae: Bilateral ovaries are within normal limits. The left ovary measures 1.9 x 2.2 x 1.5 cm. The right ovary measures 4 x 2.6 x 2.9 cm no significant free fluid. IMPRESSION: Probable early intrauterine gestational sac, but no yolk sac, fetal pole, or cardiac activity yet visualized. Recommend follow-up quantitative B-HCG levels and follow-up US in 14 days to assess viability. This recommendation follows SRU consensus  guidelines: Diagnostic Criteria for Nonviable Pregnancy Early in the First Trimester. Malva Limes Med 2013; 782:9562-13. Electronically Signed   By: Jasmine Pang M.D.   On: 12/31/2016 02:11   US Ob Transvaginal  Result Date: 12/31/2016 CLINICAL DATA:  Bleeding and pain EXAM: OBSTETRIC <14 WK [place] AND TRANSVAGINAL OB US TECHNIQUE: Both transabdominal and transvaginal ultrasound examinations were performed for complete evaluation of the gestation as well as the maternal uterus, adnexal regions, and pelvic cul-de-sac. Transvaginal technique was performed to assess early pregnancy. COMPARISON:  None. FINDINGS: Intrauterine gestational sac: Probable intrauterine gestational sac Yolk sac:  Not visible Embryo:  Not visible Cardiac Activity: Not visible MSD: 5.5  mm   5 w   2  d Subchorionic hemorrhage:  None visualized. Maternal uterus/adnexae: Bilateral ovaries are within normal limits. The left ovary measures 1.9 x 2.2 x 1.5 cm. The right ovary measures 4 x 2.6 x 2.9 cm no significant free fluid. IMPRESSION: Probable early intrauterine gestational sac, but no yolk sac, fetal pole, or cardiac activity yet visualized. Recommend follow-up quantitative B-HCG levels and follow-up US in 14 days to assess viability. This recommendation follows SRU consensus guidelines: Diagnostic Criteria for Nonviable Pregnancy Early in the First Trimester. Malva Limes Med 2013; 086:5784-69. Electronically Signed   By: Jasmine Pang M.D.   On: 12/31/2016 02:11    MAU COURSE Vitals and nursing notes reviewed I have ordered labs/imaging and reviewed them Wet prep with Trich Cultures pending Hcg elevated [place] with intrauterine pregnancy measuring consistent with dates Treatments given in MAU: None  MDM Plan of care reviewed with patient, including labs and tests ordered and medical treatment.   ASSESSMENT 1. Early stage of pregnancy   2. Vaginal bleeding in pregnancy, first trimester   3. Abdominal pain during pregnancy in  first  trimester   4. Trichomoniasis   5. Abdominal cramping     PLAN Discharge home in stable condition Rx for Flagyl 2g given to patient and her partner for STD treatment Will need follow-up US in 2 weeks. Patient to contact her OB provider to schedule follow-up Appt made for hcg recheck in 3 days Counseled on return precautions Handout given   Caryl Ada, DO [facility], [facility] 12/31/2016, 1:03 AM

## 2016-12-31 NOTE — Discharge Instructions (Signed)
-Follow-up in clinic on Monday for a lab draw only at 11am -Establish care because you will need a repeat US in 2 weeks -Take BP medication   First Trimester of Pregnancy The first trimester of pregnancy is from week 1 until the end of week 13 (months 1 through 3). During this time, your baby will begin to develop inside you. At 6-8 weeks, the eyes and face are formed, and the heartbeat can be seen on ultrasound. At the end of 12 weeks, all the baby's organs are formed. Prenatal care is all the medical care you receive before the birth of your baby. Make sure you get good prenatal care and follow all of your doctor's instructions. Follow these instructions at home: Medicines  Take over-the-counter and prescription medicines only as told by your doctor. Some medicines are safe and some medicines are not safe during pregnancy.  Take a prenatal vitamin that contains at least 600 micrograms (mcg) of folic acid.  If you have trouble pooping (constipation), take medicine that will make your stool soft (stool softener) if your doctor approves. Eating and drinking  Eat regular, healthy meals.  Your doctor will tell you the amount of weight gain that is right for you.  Avoid raw meat and uncooked cheese.  If you feel sick to your stomach (nauseous) or throw up (vomit): ? Eat 4 or 5 small meals a day instead of 3 large meals. ? Try eating a few soda crackers. ? Drink liquids between meals instead of during meals.  To prevent constipation: ? Eat foods that are high in fiber, like fresh fruits and vegetables, whole grains, and beans. ? Drink enough fluids to keep your pee (urine) clear or pale yellow. Activity  Exercise only as told by your doctor. Stop exercising if you have cramps or pain in your lower belly (abdomen) or low back.  Do not exercise if it is too hot, too humid, or if you are in a place of great height (high altitude).  Try to avoid standing for long periods of time. Move  your legs often if you must stand in one place for a long time.  Avoid heavy lifting.  Wear low-heeled shoes. Sit and stand up straight.  You can have sex unless your doctor tells you not to. Relieving pain and discomfort  Wear a good support bra if your breasts are sore.  Take warm water baths (sitz baths) to soothe pain or discomfort caused by hemorrhoids. Use hemorrhoid cream if your doctor says it is okay.  Rest with your legs raised if you have leg cramps or low back pain.  If you have puffy, bulging veins (varicose veins) in your legs: ? Wear support hose or compression stockings as told by your doctor. ? Raise (elevate) your feet for 15 minutes, 3-4 times a day. ? Limit salt in your food. Prenatal care  Schedule your prenatal visits by the twelfth week of pregnancy.  Write down your questions. Take them to your prenatal visits.  Keep all your prenatal visits as told by your doctor. This is important. Safety  Wear your seat belt at all times when driving.  Make a list of emergency phone numbers. The list should include numbers for family, friends, the hospital, and police and fire departments. General instructions  Ask your doctor for a referral to a local prenatal class. Begin classes no later than at the start of month 6 of your pregnancy.  Ask for help if you need counseling or  if you need help with nutrition. Your doctor can give you advice or tell you where to go for help.  Do not use hot tubs, steam rooms, or saunas.  Do not douche or use tampons or scented sanitary pads.  Do not cross your legs for long periods of time.  Avoid all herbs and alcohol. Avoid drugs that are not approved by your doctor.  Do not use any tobacco products, including cigarettes, chewing tobacco, and electronic cigarettes. If you need help quitting, ask your doctor. You may get counseling or other support to help you quit.  Avoid cat litter boxes and soil used by cats. These carry  germs that can cause birth defects in the baby and can cause a loss of your baby (miscarriage) or stillbirth.  Visit your dentist. At home, brush your teeth with a soft toothbrush. Be gentle when you floss. Contact a doctor if:  You are dizzy.  You have mild cramps or pressure in your lower belly.  You have a nagging pain in your belly area.  You continue to feel sick to your stomach, you throw up, or you have watery poop (diarrhea).  You have a bad smelling fluid coming from your vagina.  You have pain when you pee (urinate).  You have increased puffiness (swelling) in your face, hands, legs, or ankles. Get help right away if:  You have a fever.  You are leaking fluid from your vagina.  You have spotting or bleeding from your vagina.  You have very bad belly cramping or pain.  You gain or lose weight rapidly.  You throw up blood. It may look like coffee grounds.  You are around people who have [place] measles, fifth disease, or chickenpox.  You have a very bad headache.  You have shortness of breath.  You have any kind of trauma, such as from a fall or a car accident. Summary  The first trimester of pregnancy is from week 1 until the end of week 13 (months 1 through 3).  To take care of yourself and your unborn baby, you will need to eat healthy meals, take medicines only if your doctor tells you to do so, and do activities that are safe for you and your baby.  Keep all follow-up visits as told by your doctor. This is important as your doctor will have to ensure that your baby is healthy and growing well. This information is not intended to replace advice given to you by your health care provider. Make sure you discuss any questions you have with your health care provider. Document Released: 07/28/2007 Document Revised: 02/17/2016 Document Reviewed: 02/17/2016 Elsevier Interactive Patient Education  2017 [employer]

## 2016-12-31 NOTE — MAU Note (Signed)
Pt reports light bleeding "small amount in panties" and pain 5/10 in lower abdomen since 1200 today.

## 2017-01-03 ENCOUNTER — Ambulatory Visit (INDEPENDENT_AMBULATORY_CARE_PROVIDER_SITE_OTHER): Payer: Medicaid Other | Admitting: General Practice

## 2017-01-03 DIAGNOSIS — O3680X0 Pregnancy with inconclusive fetal viability, not applicable or unspecified: Secondary | ICD-10-CM

## 2017-01-03 DIAGNOSIS — Z3490 Encounter for supervision of normal pregnancy, unspecified, unspecified trimester: Secondary | ICD-10-CM

## 2017-01-03 LAB — GC/CHLAMYDIA PROBE AMP (~~LOC~~) NOT AT ARMC
CHLAMYDIA, DNA PROBE: NEGATIVE
Neisseria Gonorrhea: NEGATIVE

## 2017-01-03 LAB — HCG, QUANTITATIVE, PREGNANCY: HCG, BETA CHAIN, QUANT, S: 5023 m[IU]/mL — AB (ref <5)

## 2017-01-03 NOTE — Progress Notes (Signed)
Patient here for stat bhcg today. Patient denies pain or bleeding. Discussed with patient having her wait in lobby for results & updated plan of care. Patient verbalized understanding & had no questions. Reviewed results with Dr Debroah LoopArnold who agrees with favorable rise in bhcg levels, patient needs repeat ultrasound 10 days from first scan. Scheduled for 11/19 @ 10am. Informed patient of results, appt time with brief nurse visit to follow for results, & reviewed ectopic precautions. Patient verbalized understanding & had no questions

## 2017-01-10 ENCOUNTER — Ambulatory Visit (HOSPITAL_COMMUNITY)
Admission: RE | Admit: 2017-01-10 | Discharge: 2017-01-10 | Disposition: A | Discharge status: Home / Self Care | Payer: Medicaid Other | Source: Ambulatory Visit | Attending: Obstetrics & Gynecology | Admitting: Obstetrics & Gynecology

## 2017-01-10 ENCOUNTER — Ambulatory Visit (INDEPENDENT_AMBULATORY_CARE_PROVIDER_SITE_OTHER): Payer: Medicaid Other | Admitting: General Practice

## 2017-01-10 DIAGNOSIS — O209 Hemorrhage in early pregnancy, unspecified: Secondary | ICD-10-CM

## 2017-01-10 DIAGNOSIS — Z3490 Encounter for supervision of normal pregnancy, unspecified, unspecified trimester: Secondary | ICD-10-CM | POA: Insufficient documentation

## 2017-01-10 DIAGNOSIS — Z6791 Unspecified blood type, Rh negative: Secondary | ICD-10-CM | POA: Diagnosis present

## 2017-01-10 DIAGNOSIS — O3680X0 Pregnancy with inconclusive fetal viability, not applicable or unspecified: Secondary | ICD-10-CM

## 2017-01-10 DIAGNOSIS — O26891 Other specified pregnancy related conditions, first trimester: Secondary | ICD-10-CM

## 2017-01-10 DIAGNOSIS — O3680X1 Pregnancy with inconclusive fetal viability, fetus 1: Secondary | ICD-10-CM

## 2017-01-10 MED ORDER — RHO D IMMUNE GLOBULIN 1500 UNIT/2ML IJ SOSY
300.0000 ug | PREFILLED_SYRINGE | Freq: Once | INTRAMUSCULAR | Status: AC
  Administered 2017-01-10: 300 ug via INTRAMUSCULAR

## 2017-01-10 NOTE — Progress Notes (Signed)
I have reviewed this chart and agree with the RN assessment and management.    K. Meryl Davis, M.D. Attending Obstetrician & Gynecologist, [facility], [facility]  

## 2017-01-10 NOTE — Progress Notes (Signed)
Patient here for viability ultrasound results today. Reviewed ultrasound report & history with Dr Earlene Plateravis who states pregnancy is not a normal progressing pregnancy. If pregnancy is desired, patient can follow up ultrasound in one week with bhcg today, not stat. If pregnancy is not desired or patient is ready, should be offered MTX.  Informed patient of results. Patient verbalized understanding, states she has had spotting since Friday and asked what next steps are. Reviewed both options with patient. Patient is not yet ready to accept loss and requests ultrasound in a week. Scheduled ultrasound for 11/26 @ 930 & informed patient. Patient verbalized understanding and states she has a negative blood type but hasn't received rhogam injection yet. Patient wants to know when she needs to receive this. Per Dr Earlene Plateravis, should give rhogam today if patient is spotting. Rhogam given.

## 2017-01-16 ENCOUNTER — Inpatient Hospital Stay (HOSPITAL_COMMUNITY): Payer: Medicaid Other

## 2017-01-16 ENCOUNTER — Encounter (HOSPITAL_COMMUNITY): Payer: Self-pay

## 2017-01-16 ENCOUNTER — Inpatient Hospital Stay (HOSPITAL_COMMUNITY)
Admission: AD | Admit: 2017-01-16 | Discharge: 2017-01-16 | Disposition: A | Discharge status: Home / Self Care | Payer: Medicaid Other | Source: Ambulatory Visit | Attending: Obstetrics and Gynecology | Admitting: Obstetrics and Gynecology

## 2017-01-16 DIAGNOSIS — Z3A01 Less than 8 weeks gestation of pregnancy: Secondary | ICD-10-CM | POA: Insufficient documentation

## 2017-01-16 DIAGNOSIS — O2691 Pregnancy related conditions, unspecified, first trimester: Secondary | ICD-10-CM

## 2017-01-16 DIAGNOSIS — O26899 Other specified pregnancy related conditions, unspecified trimester: Secondary | ICD-10-CM

## 2017-01-16 DIAGNOSIS — O209 Hemorrhage in early pregnancy, unspecified: Secondary | ICD-10-CM | POA: Insufficient documentation

## 2017-01-16 DIAGNOSIS — O99331 Smoking (tobacco) complicating pregnancy, first trimester: Secondary | ICD-10-CM | POA: Insufficient documentation

## 2017-01-16 DIAGNOSIS — O99511 Diseases of the respiratory system complicating pregnancy, first trimester: Secondary | ICD-10-CM | POA: Diagnosis not present

## 2017-01-16 DIAGNOSIS — O4691 Antepartum hemorrhage, unspecified, first trimester: Secondary | ICD-10-CM

## 2017-01-16 DIAGNOSIS — J449 Chronic obstructive pulmonary disease, unspecified: Secondary | ICD-10-CM | POA: Diagnosis not present

## 2017-01-16 DIAGNOSIS — O4690 Antepartum hemorrhage, unspecified, unspecified trimester: Secondary | ICD-10-CM

## 2017-01-16 DIAGNOSIS — Z6791 Unspecified blood type, Rh negative: Secondary | ICD-10-CM | POA: Diagnosis not present

## 2017-01-16 DIAGNOSIS — O161 Unspecified maternal hypertension, first trimester: Secondary | ICD-10-CM | POA: Insufficient documentation

## 2017-01-16 DIAGNOSIS — R109 Unspecified abdominal pain: Secondary | ICD-10-CM

## 2017-01-16 LAB — URINALYSIS, ROUTINE W REFLEX MICROSCOPIC
Bilirubin Urine: NEGATIVE
Glucose, UA: NEGATIVE mg/dL
Ketones, ur: NEGATIVE mg/dL
Leukocytes, UA: NEGATIVE
Nitrite: NEGATIVE
Protein, ur: 30 mg/dL — AB
Specific Gravity, Urine: 1.017 (ref 1.005–1.030)
pH: 5 (ref 5.0–8.0)

## 2017-01-16 LAB — HCG, QUANTITATIVE, PREGNANCY: hCG, Beta Chain, Quant, S: 3752 m[IU]/mL — ABNORMAL HIGH (ref <5)

## 2017-01-16 NOTE — MAU Provider Note (Signed)
History     CSN: [id number]  Arrival date and time: 01/16/17 [id number]   First Provider [id number] Contact with Patient 01/16/17 2033      Chief Complaint  Patient presents with  . Abdominal Pain  . Vaginal Bleeding   [id number] @[redacted]w[redacted]d  by LMP here with VB x2 days. Bleeding is bright red and she's changing 1 pad per day. No clots. Also reports LLQ pain x1 day. Describes as cramping, intermittent. Has not taken anything for the pain. No urinary sx. She was seen 6 days ago for f/u US for pregnancy of unknown location and no YS or FP were seen again on US, and IUGS had not grown appropriately. She was offered MTX for pregnancy of unknown location.    OB History    Gravida Para Term Preterm AB Living   4 1 1   1 1    SAB TAB Ectopic Multiple Live Births   1     0 1      Obstetric Comments   Water broke- infant stress- caught in pelvis- nonprogression      Past Medical History:  Diagnosis Date  . Asthma   . COPD (chronic obstructive pulmonary disease) (HCC)   . Gestational diabetes   . Hypertension     Past Surgical History:  Procedure Laterality Date  . CESAREAN SECTION N/A 09/04/2015   Procedure: CESAREAN SECTION;  Surgeon: Lazaro ArmsLuther H Eure, MD;  [facility];  Service: Obstetrics;  Laterality: N/A;  . wisdom teeth removal      Family History  Problem Relation Age of Onset  . Diabetes Mother   . Hypertension Mother     Social History   Tobacco Use  . Smoking status: Current Every Day Smoker    Packs/day: 1.00    Years: 30.00    Pack years: 30.00    Types: Cigarettes  . Smokeless tobacco: Never Used  . Tobacco comment: Discussed need to quit for baby, asthma and COPD. Information given  Substance Use Topics  . Alcohol use: No  . Drug use: Yes    Types: Marijuana    Comment: patient has stopped    Allergies:  Allergies  Allergen Reactions  . Penicillins Swelling, Rash and Other (See Comments)    Reaction:  Facial swelling  Has patient had a PCN  reaction causing immediate rash, facial/tongue/throat swelling, SOB or lightheadedness with hypotension: Yes Has patient had a PCN reaction causing severe rash involving mucus membranes or skin necrosis: No Has patient had a PCN reaction that required hospitalization No Has patient had a PCN reaction occurring within the last 10 years: No If all of the above answers are "NO", then may proceed with Cephalosporin use.     No medications prior to admission.    Review of Systems  Gastrointestinal: Positive for abdominal pain.  Genitourinary: Positive for vaginal bleeding.   Physical Exam   Blood pressure (!) 124/56, pulse 93, temperature 98.3 F (36.8 C), temperature source Oral, resp. rate 18, height 5' 3.5" (1.613 m), weight 140 lb (63.5 kg), last menstrual period 11/24/2016, SpO2 99 %, unknown if currently breastfeeding.  Physical Exam  Constitutional: She is oriented to person, place, and time. She appears well-developed and well-nourished. No distress.  HENT:  Head: Normocephalic and atraumatic.  Neck: Normal range of motion.  Respiratory: Effort normal. No respiratory distress.  GI: Soft. She exhibits no distension and no mass. There is no tenderness. There is no rebound and no guarding.  Genitourinary:  Genitourinary  Comments: External: no lesions or erythema Vagina: rugated, pink, moist, scant drk red discharge, cleared with one fox swab. No active bleeding. Os closed. Uterus: non enlarged, anteverted, non tender, no CMT Adnexae: no masses, no tenderness left, no tenderness right   Musculoskeletal: Normal range of motion.  Neurological: She is alert and oriented to person, place, and time.  Skin: Skin is warm and dry.  Psychiatric: She has a normal mood and affect.   Results for orders placed or performed during the hospital encounter of 01/16/17 (from the past 24 hour(s))  Rh IG workup (includes ABO/Rh)     Status: None (Preliminary result)   Collection Time: 01/16/17   7:53 PM  Result Value Ref Range   Gestational Age(Wks) 6    ABO/RH(D) B NEG    Antibody Identification PASSIVELY ACQUIRED ANTI-D    Antibody Screen POS    Unit Number [id number]    Blood Component Type RHIG    Unit division 00    Status of Unit ALLOCATED    Transfusion Status OK TO TRANSFUSE   hCG, quantitative, pregnancy     Status: Abnormal   Collection Time: 01/16/17  7:53 PM  Result Value Ref Range   hCG, Beta Chain, Quant, S 3,752 (H) <5 mIU/mL  Urinalysis, Routine w reflex microscopic     Status: Abnormal   Collection Time: 01/16/17  7:56 PM  Result Value Ref Range   Color, Urine YELLOW YELLOW   APPearance CLEAR CLEAR   Specific Gravity, Urine 1.017 1.005 - 1.030   pH 5.0 5.0 - 8.0   Glucose, UA NEGATIVE NEGATIVE mg/dL   Hgb urine dipstick LARGE (A) NEGATIVE   Bilirubin Urine NEGATIVE NEGATIVE   Ketones, ur NEGATIVE NEGATIVE mg/dL   Protein, ur 30 (A) NEGATIVE mg/dL   Nitrite NEGATIVE NEGATIVE   Leukocytes, UA NEGATIVE NEGATIVE   RBC / HPF TOO NUMEROUS TO COUNT 0 - 5 RBC/hpf   WBC, UA 6-30 0 - 5 WBC/hpf   Bacteria, UA RARE (A) NONE SEEN   Squamous Epithelial / LPF 0-5 (A) NONE SEEN   Mucus PRESENT    US Ob Transvaginal  Result Date: 01/16/2017 CLINICAL DATA:  Patient with vaginal bleeding. EXAM: TRANSVAGINAL OB ULTRASOUND TECHNIQUE: Transvaginal ultrasound was performed for complete evaluation of the gestation as well as the maternal uterus, adnexal regions, and pelvic cul-de-sac. COMPARISON:  Pelvic ultrasound 01/10/2017. FINDINGS: Intrauterine gestational sac: Single Yolk sac:  Not Visualized. Embryo:  Not Visualized. Cardiac Activity: Not Visualized. Heart Rate: 0 bpm MSD: 13  mm   6 w   0  d Subchorionic hemorrhage:  Small Maternal uterus/adnexae: Normal right and left ovaries. IMPRESSION: Intrauterine gestational sac with no yolk sac or fetal pole. Recommend follow-up pelvic ultrasound in 9 days to reassess given initial study with gestational sac and no  intrauterine gestation identified on 01/10/2017. Electronically Signed   By: Annia Belt M.D.   On: 01/16/2017 21:50   MAU Course  Procedures  MDM Labs and [place] ordered and reviewed. Inappropriate growth of IUGS and no YS or FP seen. Quant HCG falling. All findings indicative of failed pregnancy. Discussed with pt and plan for expectant mngt. Has Rhogam 6 days ago. Stable for discharge home.  Assessment and Plan   1. Abnormal pregnancy in first trimester   2. Vaginal bleeding during pregnancy   3. Rh negative state in antepartum period    Discharge home Follow up in [facility] in 2 weeks Bleeding/return precations OTC analgesics prn  Allergies as  of 01/16/2017      Reactions   Penicillins Swelling, Rash, Other (See Comments)   Reaction:  Facial swelling  Has patient had a PCN reaction causing immediate rash, facial/tongue/throat swelling, SOB or lightheadedness with hypotension: Yes Has patient had a PCN reaction causing severe rash involving mucus membranes or skin necrosis: No Has patient had a PCN reaction that required hospitalization No Has patient had a PCN reaction occurring within the last 10 years: No If all of the above answers are "NO", then may proceed with Cephalosporin use.      Medication List    TAKE these medications   CONCEPT OB 130-92.4-1 MG Caps Take 1 capsule by mouth daily.   labetalol 100 MG tablet Commonly known as:  NORMODYNE Take 2 tablets (200 mg total) 2 (two) times daily by mouth.   promethazine 25 MG tablet Commonly known as:  PHENERGAN Take 25 mg by mouth every 6 (six) hours as needed for nausea or vomiting.      Donette LarryMelanie Jonathandavid Marlett, CNM 01/16/2017, 10:59 PM

## 2017-01-16 NOTE — Discharge Instructions (Signed)

## 2017-01-16 NOTE — MAU Note (Signed)
Pt states that she started having vaginal bleeding on Friday. States she goes through 1 pad/day. Pt reports left sided abdominal pain that started around the same time the bleeding did. Pt has not taken anything for pain. Pt denies vaginal discharge, urinary s/s, constipation or diarrhea. States she goes to the clinic. LMP: 11/24/2016

## 2017-01-17 ENCOUNTER — Ambulatory Visit (HOSPITAL_COMMUNITY): Payer: Medicaid Other

## 2017-01-17 LAB — RH IG WORKUP (INCLUDES ABO/RH)
ABO/RH(D): B NEG
Antibody Screen: POSITIVE
Gestational Age(Wks): 6
Unit division: 0

## 2017-01-28 ENCOUNTER — Emergency Department (HOSPITAL_COMMUNITY)
Admission: EM | Admit: 2017-01-28 | Discharge: 2017-01-28 | Disposition: A | Discharge status: Home / Self Care | Payer: Medicaid Other | Attending: Emergency Medicine | Admitting: Emergency Medicine

## 2017-01-28 ENCOUNTER — Emergency Department (HOSPITAL_COMMUNITY): Payer: Medicaid Other

## 2017-01-28 ENCOUNTER — Encounter (HOSPITAL_COMMUNITY): Payer: Self-pay | Admitting: Emergency Medicine

## 2017-01-28 DIAGNOSIS — J45909 Unspecified asthma, uncomplicated: Secondary | ICD-10-CM | POA: Diagnosis not present

## 2017-01-28 DIAGNOSIS — O034 Incomplete spontaneous abortion without complication: Secondary | ICD-10-CM

## 2017-01-28 DIAGNOSIS — R1084 Generalized abdominal pain: Secondary | ICD-10-CM | POA: Diagnosis not present

## 2017-01-28 DIAGNOSIS — F17210 Nicotine dependence, cigarettes, uncomplicated: Secondary | ICD-10-CM | POA: Diagnosis not present

## 2017-01-28 DIAGNOSIS — R1114 Bilious vomiting: Secondary | ICD-10-CM

## 2017-01-28 DIAGNOSIS — I10 Essential (primary) hypertension: Secondary | ICD-10-CM | POA: Diagnosis not present

## 2017-01-28 DIAGNOSIS — J449 Chronic obstructive pulmonary disease, unspecified: Secondary | ICD-10-CM | POA: Insufficient documentation

## 2017-01-28 DIAGNOSIS — R112 Nausea with vomiting, unspecified: Secondary | ICD-10-CM | POA: Diagnosis present

## 2017-01-28 DIAGNOSIS — R109 Unspecified abdominal pain: Secondary | ICD-10-CM

## 2017-01-28 DIAGNOSIS — Z79899 Other long term (current) drug therapy: Secondary | ICD-10-CM | POA: Diagnosis not present

## 2017-01-28 LAB — CBC WITH DIFFERENTIAL/PLATELET
BASOS ABS: 0 10*3/uL (ref 0.0–0.1)
Basophils Relative: 0 %
EOS ABS: 0 10*3/uL (ref 0.0–0.7)
EOS PCT: 0 %
HCT: 36.2 % (ref 36.0–46.0)
HEMOGLOBIN: 12.2 g/dL (ref 12.0–15.0)
LYMPHS ABS: 1 10*3/uL (ref 0.7–4.0)
Lymphocytes Relative: 10 %
MCH: 31.6 pg (ref 26.0–34.0)
MCHC: 33.7 g/dL (ref 30.0–36.0)
MCV: 93.8 fL (ref 78.0–100.0)
Monocytes Absolute: 0.5 10*3/uL (ref 0.1–1.0)
Monocytes Relative: 5 %
NEUTROS PCT: 85 %
Neutro Abs: 9.2 10*3/uL — ABNORMAL HIGH (ref 1.7–7.7)
PLATELETS: 253 10*3/uL (ref 150–400)
RBC: 3.86 MIL/uL — AB (ref 3.87–5.11)
RDW: 12.3 % (ref 11.5–15.5)
WBC: 10.8 10*3/uL — AB (ref 4.0–10.5)

## 2017-01-28 LAB — URINALYSIS, ROUTINE W REFLEX MICROSCOPIC
Bilirubin Urine: NEGATIVE
Glucose, UA: 50 mg/dL — AB
Hgb urine dipstick: NEGATIVE
Ketones, ur: 5 mg/dL — AB
LEUKOCYTES UA: NEGATIVE
NITRITE: NEGATIVE
PROTEIN: NEGATIVE mg/dL
SPECIFIC GRAVITY, URINE: 1.021 (ref 1.005–1.030)
pH: 8 (ref 5.0–8.0)

## 2017-01-28 LAB — COMPREHENSIVE METABOLIC PANEL
ALK PHOS: 105 U/L (ref 38–126)
ALT: 36 U/L (ref 14–54)
ANION GAP: 8 (ref 5–15)
AST: 31 U/L (ref 15–41)
Albumin: 4.2 g/dL (ref 3.5–5.0)
BUN: 10 mg/dL (ref 6–20)
CALCIUM: 9 mg/dL (ref 8.9–10.3)
CHLORIDE: 109 mmol/L (ref 101–111)
CO2: 23 mmol/L (ref 22–32)
CREATININE: 0.79 mg/dL (ref 0.44–1.00)
Glucose, Bld: 184 mg/dL — ABNORMAL HIGH (ref 65–99)
Potassium: 3.7 mmol/L (ref 3.5–5.1)
SODIUM: 140 mmol/L (ref 135–145)
Total Bilirubin: 0.6 mg/dL (ref 0.3–1.2)
Total Protein: 6.5 g/dL (ref 6.5–8.1)

## 2017-01-28 LAB — LIPASE, BLOOD: LIPASE: 21 U/L (ref 11–51)

## 2017-01-28 LAB — I-STAT BETA HCG BLOOD, ED (MC, WL, AP ONLY): I-stat hCG, quantitative: 61.3 m[IU]/mL — ABNORMAL HIGH (ref <5)

## 2017-01-28 LAB — CBG MONITORING, ED: GLUCOSE-CAPILLARY: 205 mg/dL — AB (ref 65–99)

## 2017-01-28 MED ORDER — FAMOTIDINE IN NACL 20-0.9 MG/50ML-% IV SOLN
20.0000 mg | Freq: Once | INTRAVENOUS | Status: AC
  Administered 2017-01-28: 20 mg via INTRAVENOUS
  Filled 2017-01-28: qty 50

## 2017-01-28 MED ORDER — LORAZEPAM 2 MG/ML IJ SOLN
1.0000 mg | Freq: Once | INTRAMUSCULAR | Status: AC
  Administered 2017-01-28: 1 mg via INTRAVENOUS
  Filled 2017-01-28: qty 1

## 2017-01-28 MED ORDER — SODIUM CHLORIDE 0.9 % IV BOLUS (SEPSIS)
1000.0000 mL | Freq: Once | INTRAVENOUS | Status: AC
  Administered 2017-01-28: 1000 mL via INTRAVENOUS

## 2017-01-28 MED ORDER — IOPAMIDOL (ISOVUE-300) INJECTION 61%
INTRAVENOUS | Status: AC
  Administered 2017-01-28: 100 mL
  Filled 2017-01-28: qty 100

## 2017-01-28 MED ORDER — ONDANSETRON HCL 4 MG/2ML IJ SOLN
4.0000 mg | Freq: Once | INTRAMUSCULAR | Status: AC
  Administered 2017-01-28: 4 mg via INTRAVENOUS
  Filled 2017-01-28: qty 2

## 2017-01-28 MED ORDER — HYDRALAZINE HCL 20 MG/ML IJ SOLN
10.0000 mg | INTRAMUSCULAR | Status: AC
  Administered 2017-01-28: 10 mg via INTRAVENOUS
  Filled 2017-01-28: qty 1

## 2017-01-28 MED ORDER — SODIUM CHLORIDE 0.9 % IV SOLN
INTRAVENOUS | Status: DC
  Administered 2017-01-28: 16:00:00 via INTRAVENOUS

## 2017-01-28 MED ORDER — ONDANSETRON 4 MG PO TBDP: 4.0000 mg | ORAL_TABLET | Freq: Three times a day (TID) | ORAL | 0 refills | Status: DC | PRN

## 2017-01-28 MED ORDER — PROCHLORPERAZINE EDISYLATE 5 MG/ML IJ SOLN
10.0000 mg | Freq: Once | INTRAMUSCULAR | Status: AC
  Administered 2017-01-28: 10 mg via INTRAVENOUS
  Filled 2017-01-28: qty 2

## 2017-01-28 NOTE — ED Triage Notes (Signed)
PT having diarr and vomiting for 2 days- so much she states she cannot remember how many times. States she has not had BP meds for 2 days. Denies headache. Has belly pain. Poor historian. Recent miscarriage.

## 2017-01-28 NOTE — ED Notes (Signed)
Pt transported back to [facility]26 on stretcher with tech, tolerated well.  Pt denies any vomiting while off unit.

## 2017-01-28 NOTE — ED Notes (Signed)
Pt sitting on bedside commode at this time. 

## 2017-01-28 NOTE — ED Provider Notes (Signed)
[facility] Provider Note   CSN: [id number] Arrival date & time: 01/28/17  0945     History   Chief Complaint Chief Complaint  Patient presents with  . Emesis    HPI Christine Mueller is a 41 y.o. female.  HPI Presents with concern of nausea, vomiting, abdominal pain. Patient notes that she has felt poorly for several days, but only over the past 12 hours or so has developed persistent nausea, and subsequently multiple episodes of vomiting occurred. Abdominal discomfort is in the mid, left, upper abdomen, sore, crampy. She denies diarrhea, but states that she feels as though she has to defecate frequently. No fever, no chills. No chest pain, no dyspnea. She has a notable history of recent spontaneous miscarriage, approximately 2 weeks ago. Since onset of this severe illness, she has been unable to tolerate anything for symptom control. Past Medical History:  Diagnosis Date  . Asthma   . COPD (chronic obstructive pulmonary disease) (HCC)   . Gestational diabetes   . Hypertension     Patient Active Problem List   Diagnosis Date Noted  . Essential hypertension 12/23/2015  . Encounter for screening and preventative care 12/23/2015  . COPD (chronic obstructive pulmonary disease) (HCC) 03/12/2015  . Asthma 03/12/2015  . Tobacco abuse 03/12/2015    Past Surgical History:  Procedure Laterality Date  . CESAREAN SECTION N/A 09/04/2015   Procedure: CESAREAN SECTION;  Surgeon: Lazaro ArmsLuther H Eure, MD;  [facility];  Service: Obstetrics;  Laterality: N/A;  . wisdom teeth removal      OB History    Gravida Para Term Preterm AB Living   4 1 1   1 1    SAB TAB Ectopic Multiple Live Births   1     0 1      Obstetric Comments   Water broke- infant stress- caught in pelvis- nonprogression       Home Medications    Prior to Admission medications   Medication Sig Start Date End Date Taking? Authorizing Provider  labetalol  (NORMODYNE) 100 MG tablet Take 2 tablets (200 mg total) 2 (two) times daily by mouth. 12/31/16   Pincus LargePhelps, Jazma Y, DO  Prenat w/o A Vit-FeFum-FePo-FA (CONCEPT OB) 130-92.4-1 MG CAPS Take 1 capsule by mouth daily.    [provider]  promethazine (PHENERGAN) 25 MG tablet Take 25 mg by mouth every 6 (six) hours as needed for nausea or vomiting.    [provider]    Family History Family History  Problem Relation Age of Onset  . Diabetes Mother   . Hypertension Mother     Social History Social History   Tobacco Use  . Smoking status: Current Every Day Smoker    Packs/day: 1.00    Years: 30.00    Pack years: 30.00    Types: Cigarettes  . Smokeless tobacco: Never Used  . Tobacco comment: Discussed need to quit for baby, asthma and COPD. Information given  Substance Use Topics  . Alcohol use: No  . Drug use: Yes    Types: Marijuana    Comment: patient has stopped     Allergies   Penicillins   Review of Systems Review of Systems  Constitutional:       Per HPI, otherwise negative  HENT:       Per HPI, otherwise negative  Respiratory:       Per HPI, otherwise negative  Cardiovascular:       Per HPI,  otherwise negative  Gastrointestinal: Positive for abdominal pain, nausea and vomiting.  Endocrine:       Negative aside from HPI  Genitourinary:       Neg aside from HPI   Musculoskeletal:       Per HPI, otherwise negative  Skin: Negative.   Neurological: Negative for syncope.     Physical Exam Updated Vital Signs BP (!) 206/109 (BP Location: Right Arm)   Pulse (!) 55   Temp (!) 97.4 F (36.3 C) (Oral)   Resp 18   LMP 11/24/2016   SpO2 100%   Physical Exam  Constitutional: She is oriented to person, place, and time. She appears well-developed and well-nourished. No distress.  Uncomfortable appearing female resting in right lateral decubitus position after arrival via EMS. She is awake, alert, interactive, though in discomfort.  HENT:  Head:  Normocephalic and atraumatic.  Eyes: Conjunctivae and EOM are normal.  Cardiovascular: Normal rate and regular rhythm.  Pulmonary/Chest: Effort normal and breath sounds normal. No stridor. No respiratory distress.  Abdominal: She exhibits no distension.  Mild abdominal tenderness, left lateral abdomen, left upper quadrant, no guarding, no distention  Musculoskeletal: She exhibits no edema.  Neurological: She is alert and oriented to person, place, and time. No cranial nerve deficit.  Skin: Skin is warm and dry.  Psychiatric: She has a normal mood and affect.  Nursing note and vitals reviewed.    ED Treatments / Results  Labs (all labs ordered are listed, but only abnormal results are displayed) Labs Reviewed  COMPREHENSIVE METABOLIC PANEL - Abnormal; Notable for the following components:      Result Value   Glucose, Bld 184 (*)    All other components within normal limits  CBC WITH DIFFERENTIAL/PLATELET - Abnormal; Notable for the following components:   WBC 10.8 (*)    RBC 3.86 (*)    Neutro Abs 9.2 (*)    All other components within normal limits  URINALYSIS, ROUTINE W REFLEX MICROSCOPIC - Abnormal; Notable for the following components:   Glucose, UA 50 (*)    Ketones, ur 5 (*)    All other components within normal limits  I-STAT BETA HCG BLOOD, ED ([facility], [facility], [facility] ONLY) - Abnormal; Notable for the following components:   I-stat hCG, quantitative 61.3 (*)    All other components within normal limits  CBG MONITORING, ED - Abnormal; Notable for the following components:   Glucose-Capillary 205 (*)    All other components within normal limits  LIPASE, BLOOD     Radiology [place] Ob Comp Less 14 Wks  Result Date: 01/28/2017 CLINICAL DATA:  Bleeding for 2 weeks. Abdominal pain. Retained products. EXAM: OBSTETRIC <14 WK US AND TRANSVAGINAL OB US TECHNIQUE: Both transabdominal and transvaginal ultrasound examinations were performed for complete evaluation of the gestation as well as the  maternal uterus, adnexal regions, and pelvic cul-de-sac. Transvaginal technique was performed to assess early pregnancy. COMPARISON:  Ultrasound 01/16/2017. FINDINGS: Intrauterine gestational sac: None visualized. Yolk sac:  None visualized. Embryo:  None visualized. Subchorionic hemorrhage:  None visualized. Maternal uterus/adnexae: Unremarkable. No prominent fluid collections. IMPRESSION: Negative exam. Uterus appears normal. No evidence of pregnancy or retained products of conception noted on today's exam. Electronically Signed   By: Maisie Fushomas  Register   On: 01/28/2017 12:22   [place] Ob Transvaginal  Result Date: 01/28/2017 CLINICAL DATA:  Bleeding for 2 weeks. Abdominal pain. Retained products. EXAM: OBSTETRIC <14 WK US AND TRANSVAGINAL OB US TECHNIQUE: Both transabdominal and transvaginal ultrasound examinations were  performed for complete evaluation of the gestation as well as the maternal uterus, adnexal regions, and pelvic cul-de-sac. Transvaginal technique was performed to assess early pregnancy. COMPARISON:  Ultrasound 01/16/2017. FINDINGS: Intrauterine gestational sac: None visualized. Yolk sac:  None visualized. Embryo:  None visualized. Subchorionic hemorrhage:  None visualized. Maternal uterus/adnexae: Unremarkable. No prominent fluid collections. IMPRESSION: Negative exam. Uterus appears normal. No evidence of pregnancy or retained products of conception noted on today's exam. Electronically Signed   By: Maisie Fus  Register   On: 01/28/2017 12:22    Procedures Procedures (including critical care time)  Medications Ordered in ED Medications  sodium chloride 0.9 % bolus 1,000 mL (1,000 mLs Intravenous New Bag/Given 01/28/17 1009)  ondansetron (ZOFRAN) injection 4 mg (4 mg Intravenous Given 01/28/17 1010)  famotidine (PEPCID) IVPB 20 mg premix (0 mg Intravenous Stopped 01/28/17 1045)  LORazepam (ATIVAN) injection 1 mg (1 mg Intravenous Given 01/28/17 1124)     Initial Impression / Assessment and  Plan / ED Course  I have reviewed the triage vital signs and the nursing notes.  Pertinent labs & imaging results that were available during my care of the patient were reviewed by me and considered in my medical decision making (see chart for details).  Update:, Patient appears slightly better.  Update: After a period of no additional vomiting, the patient has began to vomit again. I reviewed the initial ultrasound, with no evidence for retained products of conception with her. We discussed today's labs, also reassuring. Given concern for her ongoing abdominal pain, persistent vomiting in spite of multiple antiemetics, fluids, CT scan will be performed.  Patient will require additional evaluation following return of CT scan. Case discussed with Dr. Estell Harpin, who will follow-up on the studies and check on the patient.    Final Clinical Impressions(s) / ED Diagnoses   Abdominal pain Nausea and vomiting   Gerhard Munch, MD 01/28/17 1552

## 2017-01-28 NOTE — Discharge Instructions (Signed)
Please be sure to follow-up with your physician in the coming days to ensure that you are improving. Return here for concerning changes in your condition.

## 2017-01-28 NOTE — ED Notes (Signed)
Pt transported to ultrasound on stretcher with tech. 

## 2017-01-28 NOTE — ED Triage Notes (Signed)
Patient presents to the Ed from home with complaints of N/V/D since 0300. Patient denies taking HTN medication and last BP with EMS 189/105 HR 50. Patient given 4mg  Zofran and decrease nausea.

## 2017-01-28 NOTE — ED Notes (Signed)
Pt had another episode of emesis. MD made aware 

## 2017-01-31 ENCOUNTER — Ambulatory Visit: Payer: Medicaid Other | Admitting: Medical

## 2017-02-08 ENCOUNTER — Ambulatory Visit: Payer: Medicaid Other | Admitting: Advanced Practice Midwife

## 2017-03-25 ENCOUNTER — Ambulatory Visit: Payer: Medicaid Other | Admitting: Family Medicine

## 2017-03-29 ENCOUNTER — Ambulatory Visit (INDEPENDENT_AMBULATORY_CARE_PROVIDER_SITE_OTHER): Payer: Medicaid Other | Admitting: Internal Medicine

## 2017-03-29 VITALS — BP 140/100 | HR 85 | Temp 98.9°F | Ht 63.5 in | Wt 136.0 lb

## 2017-03-29 DIAGNOSIS — J069 Acute upper respiratory infection, unspecified: Secondary | ICD-10-CM | POA: Diagnosis not present

## 2017-03-29 MED ORDER — GUAIFENESIN ER 600 MG PO TB12: 600.0000 mg | ORAL_TABLET | Freq: Two times a day (BID) | ORAL | 0 refills | Status: DC

## 2017-03-29 MED ORDER — IBUPROFEN 600 MG PO TABS: 600.0000 mg | ORAL_TABLET | Freq: Four times a day (QID) | ORAL | 0 refills | Status: DC | PRN

## 2017-03-29 MED ORDER — BENZONATATE 200 MG PO CAPS: 200.0000 mg | ORAL_CAPSULE | Freq: Two times a day (BID) | ORAL | 0 refills | Status: DC | PRN

## 2017-03-29 MED ORDER — ALBUTEROL SULFATE HFA 108 (90 BASE) MCG/ACT IN AERS: 2.0000 | INHALATION_SPRAY | RESPIRATORY_TRACT | 0 refills | Status: DC | PRN

## 2017-03-29 NOTE — Progress Notes (Signed)
[facility]  Subjective:  Christine Mueller is a 42 y.o. female with history of HTN, asthma/COPD, and tobacco abuse who presents for coughing and body aches. Of note, patient suffered miscarriage after positive pregnancy test in November/December.   Patient has had nasal congestion, headache, cough and chills for the last 1 week. She thinks she is improving somewhat but still struggles with body aches and cough. She denies shortness of breath. She has only tried dayquil. Daughter had similar symptoms last week but has improved. She reports some decreased appetite. ROS: No fevers, no n/v/d. No rash   Allergies  Allergen Reactions  . Penicillins Swelling, Rash and Other (See Comments)    Reaction:  Facial swelling  Has patient had a PCN reaction causing immediate rash, facial/tongue/throat swelling, SOB or lightheadedness with hypotension: Yes Has patient had a PCN reaction causing severe rash involving mucus membranes or skin necrosis: No Has patient had a PCN reaction that required hospitalization No Has patient had a PCN reaction occurring within the last 10 years: No If all of the above answers are "NO", then may proceed with Cephalosporin use.     Social History   Tobacco Use  . Smoking status: Current Every Day Smoker    Packs/day: 1.00    Years: 30.00    Pack years: 30.00    Types: Cigarettes  . Smokeless tobacco: Never Used  . Tobacco comment: Discussed need to quit for baby, asthma and COPD. Information given  Substance Use Topics  . Alcohol use: No    Objective: Blood pressure (!) 140/100, pulse 85, temperature 98.9 F (37.2 C), temperature source Oral, height 5' 3.5" (1.613 m), weight 136 lb (61.7 kg), last menstrual period 11/24/2016, SpO2 98 %. Body mass index is 23.71 kg/m. Constitutional: Pleasant, somewhat tired appearing female in NAD HENT: Nasal congestion present. No sinus tenderness to percussion. TMs with minimal fluid  bilaterally. Normal posterior oropharynx.  Cardiovascular: RRR, S1, S2, no m/r/g.  Pulmonary/Chest: Effort normal and breath sounds normal.  Abdominal: Soft. +BS, NT Neurological: AOx3, no focal deficits. Skin: Skin is warm and dry. No rash noted.  Psychiatric: Normal mood and affect.  Vitals reviewed  Assessment/Plan: Acute upper respiratory infection - Patient likely with viral URI with ongoing nasal congestion and muscle aches. Could have had flu but outside treatment window. No focal lung findings to suggest pneumonia and no wheezing to suggest asthma exacerbation. - Recommended supportive treatment with ibuprofen and/or tylenol for muscle aches and trial of tessalon perles and mucinex to help with cough. - Encouraged drinking plenty of fluids - Refilled albuterol inhaler if develops wheezing - To return if has shortness of breath or symptoms that are not improving over next 2-3 weeks  Dani GobbleHillary Fitzgerald, MD [facility], PGY-3

## 2017-03-29 NOTE — Patient Instructions (Signed)
Ms. Sharlet SalinaBenjamin,  Please take tessalon perles as needed with mucinex. Drink plenty of water.  I refilled your albuterol and ordered ibuprofen for muscle aches--take this with food if able.  Return if you have worsening shortness of breath or continued cough after a few weeks.  Best, Dr. Sampson GoonFitzgerald

## 2017-04-03 ENCOUNTER — Encounter: Payer: Self-pay | Admitting: Internal Medicine

## 2017-04-03 DIAGNOSIS — J069 Acute upper respiratory infection, unspecified: Secondary | ICD-10-CM | POA: Insufficient documentation

## 2017-04-03 NOTE — Assessment & Plan Note (Addendum)
-   Patient likely with viral URI with ongoing nasal congestion and muscle aches. Could have had flu but outside treatment window. No focal lung findings to suggest pneumonia and no wheezing to suggest asthma exacerbation. - Recommended supportive treatment with ibuprofen and/or tylenol for muscle aches and trial of tessalon perles and mucinex to help with cough. - Encouraged drinking plenty of fluids - Refilled albuterol inhaler if develops wheezing - To return if has shortness of breath or symptoms that are not improving over next 2-3 weeks

## 2017-04-12 ENCOUNTER — Encounter: Payer: Self-pay | Admitting: Family Medicine

## 2017-04-12 DIAGNOSIS — I10 Essential (primary) hypertension: Secondary | ICD-10-CM | POA: Insufficient documentation

## 2017-04-12 NOTE — Progress Notes (Signed)
   Subjective   Patient ID: Christine Mueller    DOB: [birth year], 42 y.o. female   MRN: [id number]  CC: "PAP"  HPI: Christine Mueller is a 42 y.o. female who presents to clinic today for the following:  Cervical cancer screening: Last Pap smear negative cytology and negative HPV 02/2015.  Here today for follow-up Pap smear.  STD screening: Patient sexually active with one female partner in the last year.  Denies vaginal bleeding, vaginal discharge, fevers or chills, abdominal or pelvic pain, right upper quadrant pain.  Not currently on birth control.  Does not consistently use condoms.  Gestational diabetes: Last A1c 5.0 in 2017.  Hyperglycemia during pregnancy within the last 6 months.  Patient denies polyuria, polyphagia, polydipsia.  She has no prior history of diabetes.  ROS: see HPI for pertinent.  PMFSH: COPD/asthma, HTN, tobacco use disorder.  Surgical history c-sec, wisdom tooth extraction.  Family history DM, HTN.  Smoking status reviewed.  Medications reviewed.  Objective   BP 125/80 (BP Location: Right Arm, Patient Position: Sitting, Cuff Size: Normal)   Pulse 84   Temp 98.6 F (37 C) (Oral)   Ht 5\' 4"  (1.626 m)   Wt 136 lb 12.8 oz (62.1 kg)   LMP 03/31/2017 (Exact Date)   SpO2 98%   Breastfeeding? Unknown   BMI 23.48 kg/m  Vitals and nursing note reviewed.  General: well nourished, well developed, NAD with non-toxic appearance HEENT: normocephalic, atraumatic, moist mucous membranes Cardiovascular: regular rate and rhythm without murmurs, rubs, or gallops Lungs: clear to auscultation bilaterally with normal work of breathing Abdomen: soft, non-tender, non-distended, normoactive bowel sounds GU: chaperone present, no external lesions or rashes on exam, no vaginal discharge or bleeding, without odor, cervical loss nonfriable, no adnexal tenderness Skin: warm, dry, no rashes or lesions, cap refill < 2 seconds Extremities: warm and well perfused, normal tone, no  edema  Assessment & Plan   Gestational diabetes Based on prior pregnancy.  Last A1c 5.0 as of 2017.  Remains asymptomatic. - Hemoglobin A1c  Screening examination for sexually transmitted disease Does not use protection.  Not on birth control.  Asymptomatic.  Wet prep negative. - HIV, RPR, GC/chlamydia - Discussed safe sex practice  Cervical cancer screening History of negative cytology and negative HPV.  Asymptomatic.  GU exam unremarkable. - Pap smear performed  Orders Placed This Encounter  Procedures  . RPR  . HIV antibody (with reflex)  . Hemoglobin A1c  . POCT Wet Prep [facility]([place])   No orders of the defined types were placed in this encounter.   Durward Parcelavid McMullen, DO [facility], PGY-2 04/13/2017, 3:55 PM

## 2017-04-13 ENCOUNTER — Other Ambulatory Visit (HOSPITAL_COMMUNITY)
Admission: RE | Admit: 2017-04-13 | Discharge: 2017-04-13 | Disposition: A | Discharge status: Home / Self Care | Payer: Medicaid Other | Source: Ambulatory Visit | Attending: Family Medicine | Admitting: Family Medicine

## 2017-04-13 ENCOUNTER — Encounter: Payer: Self-pay | Admitting: Family Medicine

## 2017-04-13 ENCOUNTER — Ambulatory Visit: Payer: Medicaid Other | Admitting: Family Medicine

## 2017-04-13 VITALS — BP 125/80 | HR 84 | Temp 98.6°F | Ht 64.0 in | Wt 136.8 lb

## 2017-04-13 DIAGNOSIS — R739 Hyperglycemia, unspecified: Secondary | ICD-10-CM

## 2017-04-13 DIAGNOSIS — Z113 Encounter for screening for infections with a predominantly sexual mode of transmission: Secondary | ICD-10-CM

## 2017-04-13 DIAGNOSIS — Z01419 Encounter for gynecological examination (general) (routine) without abnormal findings: Secondary | ICD-10-CM | POA: Diagnosis not present

## 2017-04-13 DIAGNOSIS — Z114 Encounter for screening for human immunodeficiency virus [HIV]: Secondary | ICD-10-CM | POA: Diagnosis not present

## 2017-04-13 DIAGNOSIS — Z124 Encounter for screening for malignant neoplasm of cervix: Secondary | ICD-10-CM | POA: Insufficient documentation

## 2017-04-13 DIAGNOSIS — O24419 Gestational diabetes mellitus in pregnancy, unspecified control: Secondary | ICD-10-CM

## 2017-04-13 LAB — POCT WET PREP (WET MOUNT)
CLUE CELLS WET PREP WHIFF POC: NEGATIVE
TRICHOMONAS WET PREP HPF POC: ABSENT

## 2017-04-13 NOTE — Assessment & Plan Note (Addendum)
Does not use protection.  Not on birth control.  Asymptomatic.  Wet prep negative. - HIV, RPR, GC/chlamydia - Discussed safe sex practice

## 2017-04-13 NOTE — Assessment & Plan Note (Addendum)
History of negative cytology and negative HPV.  Asymptomatic.  GU exam unremarkable. - Pap smear performed

## 2017-04-13 NOTE — Assessment & Plan Note (Addendum)
Based on prior pregnancy.  Last A1c 5.0 as of 2017.  Remains asymptomatic. - Hemoglobin A1c

## 2017-04-13 NOTE — Patient Instructions (Signed)
Thank you for coming in to see us today. Please see below to review our plan for today's visit.  I will call you if your lab work is abnormal.  Otherwise you will see the results in the mail. Your next Pap will be due in 3 years if normal.  Please call the clinic at [phone] if your symptoms worsen or you have any concerns. It was our pleasure to serve you.  Durward Parcelavid Sarp Vernier, DO [facility], PGY-2

## 2017-04-14 LAB — CERVICOVAGINAL ANCILLARY ONLY
Chlamydia: NEGATIVE
Neisseria Gonorrhea: NEGATIVE

## 2017-04-14 LAB — HEMOGLOBIN A1C
ESTIMATED AVERAGE GLUCOSE: 94 mg/dL
HEMOGLOBIN A1C: 4.9 % (ref 4.8–5.6)

## 2017-04-14 LAB — RPR: RPR: NONREACTIVE

## 2017-04-14 LAB — HIV ANTIBODY (ROUTINE TESTING W REFLEX): HIV Screen 4th Generation wRfx: NONREACTIVE

## 2017-04-15 ENCOUNTER — Encounter: Payer: Self-pay | Admitting: Family Medicine

## 2017-04-18 LAB — CYTOLOGY - PAP: DIAGNOSIS: NEGATIVE

## 2017-04-22 ENCOUNTER — Other Ambulatory Visit: Payer: Self-pay | Admitting: Family Medicine

## 2017-04-22 DIAGNOSIS — N6310 Unspecified lump in the right breast, unspecified quadrant: Secondary | ICD-10-CM

## 2017-05-02 ENCOUNTER — Ambulatory Visit
Admission: RE | Admit: 2017-05-02 | Discharge: 2017-05-02 | Disposition: A | Discharge status: Home / Self Care | Payer: No Typology Code available for payment source | Source: Ambulatory Visit | Attending: Family Medicine | Admitting: Family Medicine

## 2017-05-02 ENCOUNTER — Ambulatory Visit
Admission: RE | Admit: 2017-05-02 | Discharge: 2017-05-02 | Disposition: A | Discharge status: Home / Self Care | Payer: Medicaid Other | Source: Ambulatory Visit | Attending: Family Medicine | Admitting: Family Medicine

## 2017-05-02 DIAGNOSIS — N6310 Unspecified lump in the right breast, unspecified quadrant: Secondary | ICD-10-CM

## 2017-05-05 NOTE — Progress Notes (Signed)
   Subjective   Patient ID: Christine Mueller    DOB: 01/21/1976, 42 y.o. female   MRN: [id number]  CC: "Knot on hand"  HPI: Christine DanDalila L Coger is a 42 y.o. female who presents to clinic today for the following:  Right hand knot: Patient has history of similar knot when she was 18 which was drained in the emergency room.  She noticed a recurrence over the last few months.  She denies any recent trauma or history of injury to the wrist.  Patient has no surgical history of her wrist.  She denies any fevers or chills, loss of motor function, sensory loss.  The lesion is not painful and she has not tried anything to alleviate the knot.  ROS: see HPI for pertinent.  PMFSH: COPD/asthma, HTN, tobacco use disorder.  Surgical history c-sec, wisdom tooth extraction.  Family history DM, HTN. Smoking status reviewed. Medications reviewed.  Objective   BP 132/82   Pulse 82   Temp 98.7 F (37.1 C) (Oral)   Ht 5\' 4"  (1.626 m)   Wt 139 lb (63 kg)   LMP 11/24/2016   SpO2 98%   BMI 23.86 kg/m  Vitals and nursing note reviewed.  General: well nourished, well developed, NAD with non-toxic appearance HEENT: normocephalic, atraumatic, moist mucous membranes Cardiovascular: regular rate and rhythm without murmurs, rubs, or gallops Lungs: clear to auscultation bilaterally with normal work of breathing Skin: warm, dry, no rashes or lesions, cap refill < 2 seconds Extremities: warm and well perfused, normal tone, no edema, small cyst on right dorsal wrist without tenderness, sensation and motor function intact on right hand      Assessment & Plan   Ganglion cyst of dorsum of right wrist Acute on chronic.  Localized to right wrist.  Likely a ganglion cyst by appearance and history of similar presentation.  Not consistent with hematoma.  No signs of septic arthritis.  Patient remains asymptomatic. - Reassured patient this will likely improve without need for intervention - RTC if ganglion cyst  worsens  No orders of the defined types were placed in this encounter.  No orders of the defined types were placed in this encounter.   Durward Parcelavid Lanah Steines, DO [facility], PGY-2 05/06/2017, 10:02 AM

## 2017-05-06 ENCOUNTER — Ambulatory Visit (INDEPENDENT_AMBULATORY_CARE_PROVIDER_SITE_OTHER): Payer: No Typology Code available for payment source | Admitting: Family Medicine

## 2017-05-06 ENCOUNTER — Encounter: Payer: Self-pay | Admitting: Family Medicine

## 2017-05-06 ENCOUNTER — Other Ambulatory Visit: Payer: Self-pay

## 2017-05-06 DIAGNOSIS — M67431 Ganglion, right wrist: Secondary | ICD-10-CM | POA: Diagnosis not present

## 2017-05-06 NOTE — Patient Instructions (Signed)
Thank you for coming in to see us today. Please see below to review our plan for today's visit.  1.  You do not need a formal referral to see an optometrist. 2.  Your bump is a ganglion cyst.  These usually will resolve on their own.  If your is becomes larger and bothersome, feel free to schedule another appointment to be evaluated.  Sometimes they can be drained however there is a high recurrence.  Please call the clinic at [phone] if your symptoms worsen or you have any concerns. It was our pleasure to serve you.  Durward Parcelavid McMullen, DO [facility], PGY-2

## 2017-05-06 NOTE — Assessment & Plan Note (Signed)
Acute on chronic.  Localized to right wrist.  Likely a ganglion cyst by appearance and history of similar presentation.  Not consistent with hematoma.  No signs of septic arthritis.  Patient remains asymptomatic. - Reassured patient this will likely improve without need for intervention - RTC if ganglion cyst worsens

## 2017-05-24 ENCOUNTER — Encounter: Payer: Self-pay | Admitting: *Deleted

## 2017-07-27 ENCOUNTER — Other Ambulatory Visit: Payer: Self-pay

## 2017-07-27 ENCOUNTER — Encounter (HOSPITAL_COMMUNITY): Payer: Self-pay

## 2017-07-27 ENCOUNTER — Emergency Department (HOSPITAL_COMMUNITY)
Admission: EM | Admit: 2017-07-27 | Discharge: 2017-07-27 | Disposition: A | Discharge status: Home / Self Care | Payer: Medicaid Other | Attending: Emergency Medicine | Admitting: Emergency Medicine

## 2017-07-27 DIAGNOSIS — F17210 Nicotine dependence, cigarettes, uncomplicated: Secondary | ICD-10-CM | POA: Insufficient documentation

## 2017-07-27 DIAGNOSIS — J449 Chronic obstructive pulmonary disease, unspecified: Secondary | ICD-10-CM | POA: Diagnosis not present

## 2017-07-27 DIAGNOSIS — F12188 Cannabis abuse with other cannabis-induced disorder: Secondary | ICD-10-CM | POA: Insufficient documentation

## 2017-07-27 DIAGNOSIS — I10 Essential (primary) hypertension: Secondary | ICD-10-CM | POA: Insufficient documentation

## 2017-07-27 DIAGNOSIS — R1032 Left lower quadrant pain: Secondary | ICD-10-CM | POA: Diagnosis present

## 2017-07-27 LAB — COMPREHENSIVE METABOLIC PANEL
ALBUMIN: 5.1 g/dL — AB (ref 3.5–5.0)
ALK PHOS: 113 U/L (ref 38–126)
ALT: 18 U/L (ref 14–54)
ANION GAP: 9 (ref 5–15)
AST: 21 U/L (ref 15–41)
BILIRUBIN TOTAL: 1.2 mg/dL (ref 0.3–1.2)
BUN: 9 mg/dL (ref 6–20)
CALCIUM: 9.2 mg/dL (ref 8.9–10.3)
CO2: 25 mmol/L (ref 22–32)
Chloride: 106 mmol/L (ref 101–111)
Creatinine, Ser: 0.81 mg/dL (ref 0.44–1.00)
GFR calc non Af Amer: >60 mL/min (ref >60)
GLUCOSE: 184 mg/dL — AB (ref 65–99)
Potassium: 3.6 mmol/L (ref 3.5–5.1)
Sodium: 140 mmol/L (ref 135–145)
TOTAL PROTEIN: 8.3 g/dL — AB (ref 6.5–8.1)

## 2017-07-27 LAB — CBC WITH DIFFERENTIAL/PLATELET
Basophils Absolute: 0.1 10*3/uL (ref 0.0–0.1)
Basophils Relative: 1 %
Eosinophils Absolute: 0 10*3/uL (ref 0.0–0.7)
Eosinophils Relative: 0 %
HEMATOCRIT: 41.9 % (ref 36.0–46.0)
HEMOGLOBIN: 14.3 g/dL (ref 12.0–15.0)
LYMPHS ABS: 1.3 10*3/uL (ref 0.7–4.0)
Lymphocytes Relative: 12 %
MCH: 31.7 pg (ref 26.0–34.0)
MCHC: 34.1 g/dL (ref 30.0–36.0)
MCV: 92.9 fL (ref 78.0–100.0)
MONOS PCT: 4 %
Monocytes Absolute: 0.4 10*3/uL (ref 0.1–1.0)
NEUTROS ABS: 8.5 10*3/uL — AB (ref 1.7–7.7)
NEUTROS PCT: 83 %
Platelets: 295 10*3/uL (ref 150–400)
RBC: 4.51 MIL/uL (ref 3.87–5.11)
RDW: 12.2 % (ref 11.5–15.5)
WBC: 10.3 10*3/uL (ref 4.0–10.5)

## 2017-07-27 LAB — URINALYSIS, ROUTINE W REFLEX MICROSCOPIC
Bilirubin Urine: NEGATIVE
Glucose, UA: 150 mg/dL — AB
HGB URINE DIPSTICK: NEGATIVE
Ketones, ur: 20 mg/dL — AB
LEUKOCYTES UA: NEGATIVE
Nitrite: NEGATIVE
Protein, ur: NEGATIVE mg/dL
SPECIFIC GRAVITY, URINE: 1.012 (ref 1.005–1.030)
pH: 8 (ref 5.0–8.0)

## 2017-07-27 LAB — I-STAT TROPONIN, ED: Troponin i, poc: 0 ng/mL (ref 0.00–0.08)

## 2017-07-27 LAB — LIPASE, BLOOD: Lipase: 25 U/L (ref 11–51)

## 2017-07-27 LAB — I-STAT CG4 LACTIC ACID, ED: Lactic Acid, Venous: 1.24 mmol/L (ref 0.5–1.9)

## 2017-07-27 LAB — I-STAT BETA HCG BLOOD, ED (MC, WL, AP ONLY): I-stat hCG, quantitative: <5 m[IU]/mL (ref <5)

## 2017-07-27 MED ORDER — MORPHINE SULFATE (PF) 4 MG/ML IV SOLN
4.0000 mg | Freq: Once | INTRAVENOUS | Status: AC
  Administered 2017-07-27: 4 mg via INTRAVENOUS
  Filled 2017-07-27: qty 1

## 2017-07-27 MED ORDER — LABETALOL HCL 200 MG PO TABS
200.0000 mg | ORAL_TABLET | Freq: Once | ORAL | Status: AC
  Administered 2017-07-27: 200 mg via ORAL
  Filled 2017-07-27: qty 1

## 2017-07-27 MED ORDER — LABETALOL HCL 100 MG PO TABS: 200.0000 mg | ORAL_TABLET | Freq: Two times a day (BID) | ORAL | 0 refills | Status: DC

## 2017-07-27 MED ORDER — METOCLOPRAMIDE HCL 5 MG/ML IJ SOLN
10.0000 mg | Freq: Once | INTRAMUSCULAR | Status: AC
  Administered 2017-07-27: 10 mg via INTRAVENOUS
  Filled 2017-07-27: qty 2

## 2017-07-27 MED ORDER — ONDANSETRON HCL 4 MG/2ML IJ SOLN
4.0000 mg | Freq: Once | INTRAMUSCULAR | Status: AC
  Administered 2017-07-27: 4 mg via INTRAVENOUS
  Filled 2017-07-27: qty 2

## 2017-07-27 MED ORDER — CAPSAICIN 0.025 % EX CREA
TOPICAL_CREAM | Freq: Once | CUTANEOUS | Status: AC
  Administered 2017-07-27: 06:00:00 via TOPICAL
  Filled 2017-07-27: qty 60

## 2017-07-27 NOTE — ED Triage Notes (Signed)
Per ems: Pt coming from home c/o abdominal pain that started at 1700. N/V. Ems noted diaphoretic on arrival and normal sinus. Hx of HTN.  4 mg of Zofran, 500 mg NS given by EMS

## 2017-07-27 NOTE — Discharge Instructions (Signed)
As we discussed, you need to go to your primary care doctor to get your blood pressure medications refilled.  I provided you a short course of your blood pressure medications until you are able to see them.  Make sure to drink plenty of fluids and staying hydrated.  Return the emergency department for any fever, chest pain, difficulty breathing, persistent vomiting, inability to keep any food down or any other worsening or concerning symptoms.

## 2017-07-27 NOTE — ED Provider Notes (Signed)
HTN  - tolerating po ... Given home meds  Wiaint for BP to go down Under 200    Care assumed from  [facility] LLCannah Muthersbaugh, PA-C at shift change.  In brief, this patient is a 42 y.o. female with history of asthma, COPD presented for evaluation of nausea/vomiting.  Felt to be consistent with cannabis hyperemesis syndrome.  Please see note from previous provider for full history/physical.  PLAN: Nausea/vomiting under control here in the ED.  Patient's blood pressure is elevated.  She does have a history of hypertension and is on labetalol.  She has not been able to tolerate oral meds.  Patient now able to tolerate oral meds and given oral labetalol.  No evidence of endorgan damage on work-up.  Plan for reevaluation of patient's blood pressure.  If blood pressure improved, can be discharged home.  MDM:  Repeat blood pressure improved.  Now with systolics in the 160s.  Further episodes of vomiting here in the ED.  Patient stable for discharge at this time. Patient had ample opportunity for questions and discussion. All patient's questions were answered with full understanding. Strict return precautions discussed. Patient expresses understanding and agreement to plan.    1. Cannabis hyperemesis syndrome concurrent with and due to cannabis abuse (HCC)   2. Essential hypertension       Rosana HoesLayden, Lindsey A, PA-C 07/27/17 1617    Abelino DerrickMackuen, Courteney Lyn, MD 07/28/17 [phone]

## 2017-07-27 NOTE — ED Notes (Signed)
Informed nurse of b/p recheck 245/109, 230/104 still high informed the nurse once one of b/p

## 2017-07-27 NOTE — ED Provider Notes (Signed)
[facility]-EMERGENCY DEPT Provider Note   CSN: [id number] Arrival date & time: 07/27/17  0249     History   Chief Complaint Chief Complaint  Patient presents with  . Abdominal Pain    HPI Christine Mueller is a 42 y.o. female with a hx of asthma, COPD, hypertension presents to the Emergency Department complaining of gradual, persistent, progressively worsening left lower quadrant abdominal pain onset around 2 PM.  Patient reports it has worsened throughout the afternoon.  It is associated with nausea and vomiting.  Patient denies hematemesis.  She denies diarrhea, melena or hematochezia.  Nothing seems to make the symptoms worse.  Patient does endorse marijuana usage and reports that hot showers sometimes it makes her pain better.  Patient reports that previous surgical history of cesarean section..  The history is provided by the patient and medical records. No language interpreter was used.    Past Medical History:  Diagnosis Date  . Asthma   . COPD (chronic obstructive pulmonary disease) (HCC)   . Essential hypertension 12/23/2015  . Gestational diabetes   . Hypertension     Patient Active Problem List   Diagnosis Date Noted  . Ganglion cyst of dorsum of right wrist 05/06/2017  . Primary hypertension 04/12/2017  . COPD (chronic obstructive pulmonary disease) (HCC) 03/12/2015  . Asthma 03/12/2015  . Tobacco use disorder 03/12/2015    Past Surgical History:  Procedure Laterality Date  . CESAREAN SECTION N/A 09/04/2015   Procedure: CESAREAN SECTION;  Surgeon: Lazaro ArmsLuther H Eure, MD;  [facility];  Service: Obstetrics;  Laterality: N/A;  . wisdom teeth removal       OB History    Gravida  4   Para  1   Term  1   Preterm      AB  1   Living  1     SAB  1   TAB      Ectopic      Multiple  0   Live Births  1        Obstetric Comments  Water broke- infant stress- caught in pelvis- nonprogression         Home  Medications    Prior to Admission medications   Medication Sig Start Date End Date Taking? Authorizing Provider  albuterol (PROVENTIL HFA;VENTOLIN HFA) 108 (90 Base) MCG/ACT inhaler Inhale 2 puffs into the lungs every 4 (four) hours as needed for wheezing or shortness of breath. 03/29/17  Yes Casey BurkittFitzgerald, Hillary Moen, MD  benzonatate (TESSALON) 200 MG capsule Take 1 capsule (200 mg total) by mouth 2 (two) times daily as needed for cough. Patient not taking: Reported on 07/27/2017 03/29/17   Casey BurkittFitzgerald, Hillary Moen, MD  guaiFENesin (MUCINEX) 600 MG 12 hr tablet Take 1 tablet (600 mg total) by mouth 2 (two) times daily. Patient not taking: Reported on 07/27/2017 03/29/17   Casey BurkittFitzgerald, Hillary Moen, MD  ibuprofen (ADVIL,MOTRIN) 600 MG tablet Take 1 tablet (600 mg total) by mouth every 6 (six) hours as needed. Patient not taking: Reported on 07/27/2017 03/29/17   Casey BurkittFitzgerald, Hillary Moen, MD  labetalol (NORMODYNE) 100 MG tablet Take 2 tablets (200 mg total) 2 (two) times daily by mouth. Patient not taking: Reported on 07/27/2017 12/31/16   Pincus LargePhelps, Jazma Y, DO  ondansetron (ZOFRAN ODT) 4 MG disintegrating tablet Take 1 tablet (4 mg total) by mouth every 8 (eight) hours as needed for nausea or vomiting. Patient not taking: Reported on 07/27/2017 01/28/17   Jeraldine LootsLockwood,  Molly Maduro, MD    Family History Family History  Problem Relation Age of Onset  . Diabetes Mother   . Hypertension Mother   . Breast cancer Neg Hx     Social History Social History   Tobacco Use  . Smoking status: Current Every Day Smoker    Packs/day: 1.50    Years: 30.00    Pack years: 45.00    Types: Cigarettes  . Smokeless tobacco: Never Used  . Tobacco comment: Discussed need to quit for baby, asthma and COPD. Information given  Substance Use Topics  . Alcohol use: No  . Drug use: Yes    Types: Marijuana    Comment: patient has stopped     Allergies   Penicillins   Review of Systems Review of Systems  Constitutional: Positive  for diaphoresis. Negative for appetite change, fatigue, fever and unexpected weight change.  HENT: Negative for mouth sores.   Eyes: Negative for visual disturbance.  Respiratory: Negative for cough, chest tightness, shortness of breath and wheezing.   Cardiovascular: Negative for chest pain.  Gastrointestinal: Positive for abdominal pain. Negative for constipation, diarrhea, nausea and vomiting.  Endocrine: Negative for polydipsia, polyphagia and polyuria.  Genitourinary: Negative for dysuria, frequency, hematuria and urgency.  Musculoskeletal: Negative for back pain and neck stiffness.  Skin: Negative for rash.  Allergic/Immunologic: Negative for immunocompromised state.  Neurological: Negative for syncope, light-headedness and headaches.  Hematological: Does not bruise/bleed easily.  Psychiatric/Behavioral: Negative for sleep disturbance. The patient is not nervous/anxious.      Physical Exam Updated Vital Signs BP (!) 162/119   Pulse (!) 57   Temp 97.9 F (36.6 C) (Rectal)   Resp (!) 22   Ht 5\' 3"  (1.6 m)   Wt 65.8 kg (145 lb)   LMP 07/18/2017 (Exact Date)   SpO2 100%   BMI 25.69 kg/m   Physical Exam  Constitutional: She appears well-developed and well-nourished. She appears distressed.  Awake, alert, nontoxic appearance Patient writhing in bed  HENT:  Head: Normocephalic and atraumatic.  Mouth/Throat: Oropharynx is clear and moist. No oropharyngeal exudate.  Eyes: Conjunctivae are normal. No scleral icterus.  Neck: Normal range of motion. Neck supple.  Cardiovascular: Normal rate, regular rhythm and intact distal pulses.  Pulmonary/Chest: Effort normal and breath sounds normal. No respiratory distress. She has no wheezes.  Equal chest expansion  Abdominal: Soft. Bowel sounds are normal. She exhibits no mass. There is no tenderness. There is no rigidity, no rebound, no guarding and no CVA tenderness.  Musculoskeletal: Normal range of motion. She exhibits no edema.    Neurological: She is alert.  Speech is clear and goal oriented Moves extremities without ataxia  Skin: Skin is warm. She is diaphoretic.  Psychiatric: She has a normal mood and affect.  Nursing note and vitals reviewed.    ED Treatments / Results  Labs (all labs ordered are listed, but only abnormal results are displayed) Labs Reviewed  CBC WITH DIFFERENTIAL/PLATELET - Abnormal; Notable for the following components:      Result Value   Neutro Abs 8.5 (*)    All other components within normal limits  COMPREHENSIVE METABOLIC PANEL - Abnormal; Notable for the following components:   Glucose, Bld 184 (*)    Total Protein 8.3 (*)    Albumin 5.1 (*)    All other components within normal limits  URINALYSIS, ROUTINE W REFLEX MICROSCOPIC - Abnormal; Notable for the following components:   Color, Urine STRAW (*)    APPearance HAZY (*)  Glucose, UA 150 (*)    Ketones, ur 20 (*)    All other components within normal limits  LIPASE, BLOOD  I-STAT BETA HCG BLOOD, ED ([facility], [facility], [facility] ONLY)  I-STAT TROPONIN, ED  I-STAT CG4 LACTIC ACID, ED  I-STAT CG4 LACTIC ACID, ED    EKG EKG Interpretation  Date/Time:  Wednesday July 27 2017 04:08:53 EDT Ventricular Rate:  48 PR Interval:    QRS Duration: 93 QT Interval:  493 QTC Calculation: 441 R Axis:   75 Text Interpretation:  Sinus bradycardia Left ventricular hypertrophy Anterior Q waves, possibly due to LVH NO STEMI improved TWI in V2 Otherwise no significant change Confirmed by Drema Pry [phone]) on 07/27/2017 4:50:19 AM   Procedures Procedures (including critical care time)  Medications Ordered in ED Medications  ondansetron (ZOFRAN) injection 4 mg (4 mg Intravenous Given 07/27/17 0427)  morphine 4 MG/ML injection 4 mg (4 mg Intravenous Given 07/27/17 0427)  metoCLOPramide (REGLAN) injection 10 mg (10 mg Intravenous Given 07/27/17 0550)  capsaicin (ZOSTRIX) 0.025 % cream ( Topical Given 07/27/17 0553)  labetalol (NORMODYNE) tablet 200 mg  (200 mg Oral Given 07/27/17 6045)     Initial Impression / Assessment and Plan / ED Course  I have reviewed the triage vital signs and the nursing notes.  Pertinent labs & imaging results that were available during my care of the patient were reviewed by me and considered in my medical decision making (see chart for details).     Patient presents with abdominal pain.  Suspect hyperemesis secondary to cannabis use.  Abdomen is soft without tenderness on my exam.  No CVA tenderness.  Labs are reassuring.  Urinalysis without evidence of urinary tract infection.  Patient is afebrile without leukocytosis.  No evidence of sepsis.  Serum creatinine is normal.  Troponin is negative.  Initial lactic acid is negative.  Patient presents severely hypertensive.  She has not had her home medications in 48 hours due to her persistent vomiting.  No evidence of endorgan damage today.  Patient given fluids, pain control, diabetic and capsaicin cream.  She has improved however her blood pressure remains elevated.  Patient will be given her home blood pressure medications.  7:03 AM At shift change care was transferred to Graciella Freer, PA-C who will recheck BP after meds with plan for D/C home.    The patient was discussed with and seen by Dr. Eudelia Bunch who agrees with the treatment plan.   Final Clinical Impressions(s) / ED Diagnoses   Final diagnoses:  Cannabis hyperemesis syndrome concurrent with and due to cannabis abuse [facility])  Essential hypertension    ED Discharge Orders    None       Mardene Sayer Boyd Kerbs 07/27/17 0703    Nira Conn, MD 07/27/17 [phone]

## 2017-08-01 ENCOUNTER — Ambulatory Visit (INDEPENDENT_AMBULATORY_CARE_PROVIDER_SITE_OTHER): Payer: Medicaid Other | Admitting: Internal Medicine

## 2017-08-01 ENCOUNTER — Encounter: Payer: Self-pay | Admitting: Internal Medicine

## 2017-08-01 ENCOUNTER — Ambulatory Visit: Payer: No Typology Code available for payment source | Admitting: Family Medicine

## 2017-08-01 ENCOUNTER — Other Ambulatory Visit: Payer: Self-pay

## 2017-08-01 DIAGNOSIS — I10 Essential (primary) hypertension: Secondary | ICD-10-CM

## 2017-08-01 MED ORDER — LISINOPRIL 20 MG PO TABS: 20.0000 mg | ORAL_TABLET | Freq: Every day | ORAL | 3 refills | Status: DC

## 2017-08-01 NOTE — Assessment & Plan Note (Signed)
BP above goal at 140/88 in clinic today, but was even higher at recent ED visit. Patient also with frequent HA which may be related to elevated BP, however no red flags. Will discontinue labetalol and resume lisinopril per patient request. F/u in one month. Can adjust medication accordingly at that time. Encouraged to continue checking BP at home and bring log to her next appt.

## 2017-08-01 NOTE — Patient Instructions (Signed)
It was nice meeting you today Christine Mueller!  Please STOP taking labetalol and START taking lisinopril 20mg  daily. Please write down your daily blood pressure and bring it with you to your next appointment. If you find that your blood pressures are consistently high (higher than 160/100) please let us know before your next appointment.   We will see you back in one month to check your blood pressure again.   If you have any questions or concerns, please feel free to call the clinic.   Be well,  Dr. Natale MilchLancaster

## 2017-08-01 NOTE — Progress Notes (Signed)
   Subjective:   Patient: Christine Mueller       Birthdate: [birth year]       MRN: [id number]      HPI  Christine Mueller is a 42 y.o. female presenting for elevated BP.   Elevated BP Patient was seen in ED for vomiting on 06/05 and was noted to have elevated BP. This improved throughout ED stay, however it was recommended that she follow up with PCP. Patient has been taking labetalol which was started during her pregnancy, however does not think it is very effective. She was taking lisinopril before her pregnancy and thought this worked much better. Endorses HA recently but no vision changes. Recently bought a BP cuff to use at home, however has not been checking regularly.   Smoking status reviewed. Patient is current every day smoker.   Review of Systems See HPI.     Objective:  Physical Exam  Constitutional: She is oriented to person, place, and time. She appears well-developed and well-nourished. No distress.  HENT:  Head: Normocephalic and atraumatic.  Cardiovascular: Normal rate, regular rhythm and normal heart sounds.  No murmur heard. Pulmonary/Chest: Effort normal. No respiratory distress.  Neurological: She is alert and oriented to person, place, and time.  Psychiatric: She has a normal mood and affect. Her behavior is normal.      Assessment & Plan:  Primary hypertension BP above goal at 140/88 in clinic today, but was even higher at recent ED visit. Patient also with frequent HA which may be related to elevated BP, however no red flags. Will discontinue labetalol and resume lisinopril per patient request. F/u in one month. Can adjust medication accordingly at that time. Encouraged to continue checking BP at home and bring log to her next appt.    Tarri AbernethyAbigail J Marlowe Lawes, MD, MPH PGY-3 [facility] Pager [phone]
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
## 2017-08-22 ENCOUNTER — Ambulatory Visit (INDEPENDENT_AMBULATORY_CARE_PROVIDER_SITE_OTHER): Payer: Medicaid Other | Admitting: Family Medicine

## 2017-08-22 ENCOUNTER — Encounter: Payer: Self-pay | Admitting: Family Medicine

## 2017-08-22 VITALS — BP 120/70 | HR 70 | Temp 98.8°F | Ht 64.0 in | Wt 143.4 lb

## 2017-08-22 DIAGNOSIS — N926 Irregular menstruation, unspecified: Secondary | ICD-10-CM

## 2017-08-22 DIAGNOSIS — F17200 Nicotine dependence, unspecified, uncomplicated: Secondary | ICD-10-CM

## 2017-08-22 DIAGNOSIS — I10 Essential (primary) hypertension: Secondary | ICD-10-CM

## 2017-08-22 DIAGNOSIS — N939 Abnormal uterine and vaginal bleeding, unspecified: Secondary | ICD-10-CM | POA: Diagnosis present

## 2017-08-22 LAB — POCT URINE PREGNANCY: PREG TEST UR: NEGATIVE

## 2017-08-22 NOTE — Assessment & Plan Note (Addendum)
Chronic.  Controlled.  Current everyday smoker. - Continue lisinopril 20 mg daily - Discussed smoking cessation - Future order placed for BMET in approximately 2 weeks to check kidney function following initiation of ACE inhibitor

## 2017-08-22 NOTE — Assessment & Plan Note (Addendum)
Acute.  Remote history of AUB in her teens.  Negative pregnancy.  Low concern for symptomatic anemia.  She does have a history of miscarriage several years ago.  Early period may have been related to miscarriage though uncertain.  Patient does desire pregnancy. - Reassured patient need for continual AUB with recommendations to RTC in 3 months - Reviewed return precautions - Recommended daily prenatal vitamin if trying to achieve pregnancy

## 2017-08-22 NOTE — Assessment & Plan Note (Signed)
Chronic.  Current everyday smoker.  Denies cessation today. - Given contact information for 1 800 quit now and advised patient to contact me when ready
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
## 2017-08-22 NOTE — Patient Instructions (Signed)
Thank you for coming in to see us today. Please see below to review our plan for today's visit.  1.  Pregnancy test was negative.  If you are trying to get pregnant, be sure to take a daily prenatal vitamin over-the-counter. 2.  Encourage you to stop smoking.  Please let us know when you are ready for this.  In the meantime you can call 1-800 quit NOW for free resources. 3.  I would like to see you again in 3 months to reassess your abnormal vaginal bleeding.  Please call the clinic at [phone] if your symptoms worsen or you have any concerns. It was our pleasure to serve you.  Durward Parcelavid McMullen, DO [facility], PGY-2

## 2017-09-26 ENCOUNTER — Telehealth: Payer: Self-pay | Admitting: Family Medicine

## 2017-09-26 NOTE — Telephone Encounter (Signed)
Patient left message she missed call from here.  Call back is [phone]  Ples SpecterAlisa Saket Hellstrom, RN [facility] RN)

## 2017-09-26 NOTE — Telephone Encounter (Signed)
Called - VM was in spanish - did not leave VM since patient speaks [place].  Pt needs appt - Return in about 3 months (around 11/22/2017) for AUB

## 2017-10-20 ENCOUNTER — Ambulatory Visit: Payer: Medicaid Other | Admitting: Family Medicine

## 2017-10-20 NOTE — Progress Notes (Deleted)
   Subjective   Patient ID: Christine Mueller    DOB: 04-20-1975, 42 y.o. female   MRN: [id number]  CC: "***"  HPI: Christine Mueller is a 42 y.o. female who presents to clinic today for the following:  ***: ***  ROS: see HPI for pertinent.  PMFSH: ***. Smoking status reviewed. Medications reviewed.  Objective   There were no vitals taken for this visit. Vitals and nursing note reviewed.  General: well nourished, well developed, NAD with non-toxic appearance HEENT: normocephalic, atraumatic, moist mucous membranes Neck: supple, non-tender without lymphadenopathy Cardiovascular: regular rate and rhythm without murmurs, rubs, or gallops Lungs: clear to auscultation bilaterally with normal work of breathing Abdomen: soft, non-tender, non-distended, normoactive bowel sounds Skin: warm, dry, no rashes or lesions, cap refill < 2 seconds Extremities: warm and well perfused, normal tone, no edema  Assessment & Plan   No problem-specific Assessment & Plan notes found for this encounter.  No orders of the defined types were placed in this encounter.  No orders of the defined types were placed in this encounter.   Durward Parcelavid Letasha Kershaw, DO [facility], PGY-3 10/20/2017, 1:35 PM

## 2017-11-02 ENCOUNTER — Telehealth: Payer: Self-pay

## 2017-11-02 NOTE — Telephone Encounter (Signed)
Pt contacted me asking about her option for payment here at [facility]. Pt states her income has gone up and she no longer is eligible for medicaid. Patient has the denial letter from them. Can she be set up for a financial apt? You can call her at [phone]. She is bringing her so in on 9/13 for a lab apt. Will forward to our Artist.

## 2017-11-22 ENCOUNTER — Telehealth: Payer: Self-pay | Admitting: Family Medicine

## 2017-11-22 NOTE — Telephone Encounter (Signed)
Pt would like to know if she is due for a flu shot. Please call pt to let her know if she needs to schedule an appointment for a shot.

## 2017-11-22 NOTE — Telephone Encounter (Signed)
Attempted to call pt Hispanic lady on the voicemail. Not sure if this is the right number in chart. Will try again later. Deseree Bruna Potter, CMA

## 2017-11-23 NOTE — Telephone Encounter (Signed)
Called number listed and no answer but again a Spanish message came on the voice mail. Did not leave message because not sure if number is correct.  Glennie Hawk, CMA

## 2018-04-13 ENCOUNTER — Ambulatory Visit (INDEPENDENT_AMBULATORY_CARE_PROVIDER_SITE_OTHER): Payer: Self-pay | Admitting: Family Medicine

## 2018-04-13 ENCOUNTER — Encounter: Payer: Self-pay | Admitting: Family Medicine

## 2018-04-13 VITALS — BP 160/80 | HR 80 | Temp 98.6°F | Wt 140.8 lb

## 2018-04-13 DIAGNOSIS — J45909 Unspecified asthma, uncomplicated: Secondary | ICD-10-CM

## 2018-04-13 DIAGNOSIS — F17200 Nicotine dependence, unspecified, uncomplicated: Secondary | ICD-10-CM

## 2018-04-13 DIAGNOSIS — Z0000 Encounter for general adult medical examination without abnormal findings: Secondary | ICD-10-CM

## 2018-04-13 DIAGNOSIS — I10 Essential (primary) hypertension: Secondary | ICD-10-CM

## 2018-04-13 MED ORDER — VARENICLINE TARTRATE 0.5 MG X 11 & 1 MG X 42 PO MISC: ORAL | 0 refills | Status: DC

## 2018-04-13 MED ORDER — LISINOPRIL 20 MG PO TABS: 20.0000 mg | ORAL_TABLET | Freq: Every day | ORAL | 3 refills | Status: DC

## 2018-04-13 NOTE — Assessment & Plan Note (Signed)
Chronic.  Uncertain where this was diagnosis was made.  No PFTs. - Discussed reasons to use albuterol with plans to follow-up for formal PFT studies here at the clinic

## 2018-04-13 NOTE — Assessment & Plan Note (Signed)
Chronic.  Uncontrolled.  Seems to be due to nonadherence.  No history of nephropathy. - Continue lisinopril at 20 mg daily and emphasized importance of daily adherence - Checking CBC, BMET, TSH, lipid panel

## 2018-04-13 NOTE — Assessment & Plan Note (Signed)
Chronic user.  Good candidate today. - Discussed importance of smoking cessation with plan to begin Chantix - RTC 1 month

## 2018-04-13 NOTE — Patient Instructions (Signed)
Thank you for coming in to see [place] today. Please see below to review our plan for today's visit.  1.  I am pleased to hear you are interested in quitting today!  Your Chantix today and increase gradually as instructed.  You need to discontinue all cigarettes completely 1 week after starting the medication.  I would like to see you again in 1 month to be sure the medication is working. 2.  We will work on getting you set up for pulmonary function testing with our pharmacy team here at the clinic.  This will help [place] to better understand if you do have COPD versus asthma.  If not, you likely may not need albuterol in the future. 3.  It is very important that you take your lisinopril every day.  I recommend setting a timer in the morning and setting the medication at your bedside or in the bathroom as she get ready to help you not forget.  Please call the clinic at [phone] if your symptoms worsen or you have any concerns. It was our pleasure to serve you.  Durward Parcel, DO [facility], PGY-3

## 2018-04-13 NOTE — Progress Notes (Signed)
Subjective   Patient ID: Christine Mueller    DOB: January 15, 1976, 42 y.o. female   MRN: [id number]  CC: "Annual physical"  HPI: [name] is a 43 y.o. female who presents to clinic today for the following:  Annual physical: Patient is here today for her annual physical exam.  She has no concerns today.  Her primary issue is hypertension which is usually due to poor adherence.  She otherwise has no other medical concerns.  She is up-to-date on her vaccinations and preventative care.  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  Hypertension: Patient reports not taking her lisinopril today or yesterday.  She misses on average 3 days out of the week.  She also failed to follow-up for recheck of her kidney function at her last visit.  She denies any chest pain, shortness of breath, change in vision, lower extremity swelling.  Smoking: Current every day smoker with 1/4 pack/day.  Interested in cessation today.  She does not live with anybody who smokes.  She has tried smoking in the past but has not used medication assistance.  She is interested in Chantix today.  Asthma: Has albuterol with seldom use.  Denies shortness of breath, cough, wheeze.  There is also a diagnosis of COPD, however patient does not recall being told she has this.  She has no PFTs on file.  She is a current everyday smoker.  ROS: see HPI for pertinent.  PMFSH: COPD/asthma, HTN, tobacco use disorder.Surgical history c-sec, wisdom tooth extraction. Family history DM, HTN. Smoking status reviewed. Medications reviewed.  Objective   BP (!) 160/80   Pulse 80   Temp 98.6 F (37 C) (Oral)   Wt 140 lb 12.8 oz (63.9 kg)   SpO2 99%   BMI 24.17 kg/m  Vitals and nursing note reviewed.  General: well nourished, well developed, NAD with non-toxic appearance HEENT: normocephalic, atraumatic, moist mucous membranes, patent ear canals with gray TMs, no oropharynx erythema or tonsillar edema Neck: supple,  non-tender without lymphadenopathy Cardiovascular: regular rate and rhythm without murmurs, rubs, or gallops Lungs: clear to auscultation bilaterally with normal work of breathing Abdomen: soft, non-tender, non-distended, normoactive bowel sounds Skin: warm, dry, no rashes or lesions, cap refill < 2 seconds Extremities: warm and well perfused, normal tone, no edema  Assessment & Plan   Tobacco use disorder Chronic user.  Good candidate today. - Discussed importance of smoking cessation with plan to begin Chantix - RTC 1 month  Primary hypertension Chronic.  Uncontrolled.  Seems to be due to nonadherence.  No history of nephropathy. - Continue lisinopril at 20 mg daily and emphasized importance of daily adherence - Checking CBC, BMET, TSH, lipid panel  Asthma Chronic.  Uncertain where this was diagnosis was made.  No PFTs. - Discussed reasons to use albuterol with plans to follow-up for formal PFT studies here at the clinic  Orders Placed This Encounter  Procedures  . CBC  . TSH  . Basic Metabolic Panel  . Lipid Panel   Meds ordered this encounter  Medications  . lisinopril (PRINIVIL,ZESTRIL) 20 MG tablet    Sig: Take 1 tablet (20 mg total) by mouth daily.    Dispense:  90 tablet    Refill:  3  . varenicline (CHANTIX STARTING MONTH PAK) 0.5 MG X 11 & 1 MG X 42 tablet    Sig: Take one 0.5 mg tablet by mouth once daily for 3 days, then increase to one 0.5 mg tablet twice  daily for 4 days, then increase to one 1 mg tablet twice daily.    Dispense:  53 tablet    Refill:  0    Durward Parcel, DO [facility], PGY-3 04/13/2018, 2:15 PM

## 2018-04-14 LAB — TSH: TSH: 0.295 u[IU]/mL — ABNORMAL LOW (ref 0.450–4.500)

## 2018-04-14 LAB — LIPID PANEL
CHOL/HDL RATIO: 2.2 ratio (ref 0.0–4.4)
CHOLESTEROL TOTAL: 157 mg/dL (ref 100–199)
HDL: 71 mg/dL (ref >39)
LDL CALC: 75 mg/dL (ref 0–99)
TRIGLYCERIDES: 54 mg/dL (ref 0–149)
VLDL Cholesterol Cal: 11 mg/dL (ref 5–40)

## 2018-04-14 LAB — BASIC METABOLIC PANEL
BUN/Creatinine Ratio: 11 (ref 9–23)
BUN: 9 mg/dL (ref 6–24)
CALCIUM: 9 mg/dL (ref 8.7–10.2)
CO2: 20 mmol/L (ref 20–29)
CREATININE: 0.81 mg/dL (ref 0.57–1.00)
Chloride: 106 mmol/L (ref 96–106)
GFR calc Af Amer: 104 mL/min/{1.73_m2} (ref >59)
GFR, EST NON AFRICAN AMERICAN: 90 mL/min/{1.73_m2} (ref >59)
Glucose: 85 mg/dL (ref 65–99)
Potassium: 4.2 mmol/L (ref 3.5–5.2)
Sodium: 139 mmol/L (ref 134–144)

## 2018-04-14 LAB — CBC
HEMATOCRIT: 41.1 % (ref 34.0–46.6)
HEMOGLOBIN: 13.2 g/dL (ref 11.1–15.9)
MCH: 30.7 pg (ref 26.6–33.0)
MCHC: 32.1 g/dL (ref 31.5–35.7)
MCV: 96 fL (ref 79–97)
Platelets: 230 10*3/uL (ref 150–450)
RBC: 4.3 x10E6/uL (ref 3.77–5.28)
RDW: 11.8 % (ref 11.7–15.4)
WBC: 5.1 10*3/uL (ref 3.4–10.8)

## 2018-04-17 ENCOUNTER — Other Ambulatory Visit: Payer: Self-pay | Admitting: Family Medicine

## 2018-04-17 ENCOUNTER — Telehealth: Payer: Self-pay | Admitting: Family Medicine

## 2018-04-17 ENCOUNTER — Encounter: Payer: Self-pay | Admitting: Family Medicine

## 2018-04-17 DIAGNOSIS — R7989 Other specified abnormal findings of blood chemistry: Secondary | ICD-10-CM

## 2018-04-17 NOTE — Telephone Encounter (Signed)
Attempted to call patient regarding labs for recent office visit for annual physical on 04/13/2018.  No answer, invalid phone number.  No other numbers available demographics.  TSH level appears to be low which is chronic for her.  She has no known thyroid issues.  Will send letter advising patient to follow-up with additional thyroid studies.  Future orders placed.  Durward Parcel, DO [facility], PGY-3

## 2018-04-20 ENCOUNTER — Ambulatory Visit: Payer: Self-pay | Admitting: Pharmacist

## 2018-12-04 ENCOUNTER — Other Ambulatory Visit: Payer: Self-pay | Admitting: Family Medicine

## 2018-12-04 DIAGNOSIS — Z1231 Encounter for screening mammogram for malignant neoplasm of breast: Secondary | ICD-10-CM

## 2019-01-16 ENCOUNTER — Ambulatory Visit: Payer: Self-pay

## 2019-01-21 ENCOUNTER — Ambulatory Visit (HOSPITAL_COMMUNITY)
Admission: EM | Admit: 2019-01-21 | Discharge: 2019-01-21 | Disposition: A | Discharge status: Home / Self Care | Payer: Self-pay | Attending: Family Medicine | Admitting: Family Medicine

## 2019-01-21 ENCOUNTER — Other Ambulatory Visit: Payer: Self-pay

## 2019-01-21 ENCOUNTER — Encounter (HOSPITAL_COMMUNITY): Payer: Self-pay | Admitting: Family Medicine

## 2019-01-21 DIAGNOSIS — R197 Diarrhea, unspecified: Secondary | ICD-10-CM

## 2019-01-21 DIAGNOSIS — B349 Viral infection, unspecified: Secondary | ICD-10-CM

## 2019-01-21 LAB — POC SARS CORONAVIRUS 2 AG -  ED: SARS Coronavirus 2 Ag: NEGATIVE

## 2019-01-21 LAB — POC SARS CORONAVIRUS 2 AG: SARS Coronavirus 2 Ag: NEGATIVE

## 2019-01-21 NOTE — Discharge Instructions (Addendum)
Vitamin C 500 mg twice daily Vitamin D 5000 IU (125 mcg) daily Zinc 50 mg daily  Listerine mouthwash three times daily

## 2019-01-21 NOTE — ED Triage Notes (Signed)
Pt presents with diarrhea upon waking up this morning.

## 2019-01-21 NOTE — ED Provider Notes (Signed)
[facility]    CSN: [id number] Arrival date & time: 01/21/19  1511      History   Chief Complaint Chief Complaint  Patient presents with  . Diarrhea    HPI Christine Mueller is a 43 y.o. female.   This is the initial visit for this 43 year old woman at [facility] urgent care.  This patient is a member of the family of 3 the comes in for Covid testing.  The child developed diarrhea and fever 2 days ago.  He is going to a daycare center.  He has had no cough.  Mother developed loose stools today.  The child was diagnosed with an ear infection 2 weeks ago and was given amoxicillin but he spilled most of the medicine and only took it for 2 days.  Family wants to have his ears rechecked.  Patient works as med Geophysical data processor.     Past Medical History:  Diagnosis Date  . Asthma   . COPD (chronic obstructive pulmonary disease) (HCC)   . Essential hypertension 12/23/2015  . Gestational diabetes   . Hypertension     Patient Active Problem List   Diagnosis Date Noted  . Abnormal uterine bleeding (AUB) 08/22/2017  . Ganglion cyst of dorsum of right wrist 05/06/2017  . Primary hypertension 04/12/2017  . COPD (chronic obstructive pulmonary disease) (HCC) 03/12/2015  . Asthma 03/12/2015  . Tobacco use disorder 03/12/2015    Past Surgical History:  Procedure Laterality Date  . CESAREAN SECTION N/A 09/04/2015   Procedure: CESAREAN SECTION;  Surgeon: Lazaro Arms, MD;  Location: [facility] SU[facility];  Service: Obstetrics;  Laterality: N/A;  . wisdom teeth removal      OB History    Gravida  4   Para  1   Term  1   Preterm      AB  1   Living  1     SAB  1   TAB      Ectopic      Multiple  0   Live Births  1        Obstetric Comments  Water broke- infant stress- caught in pelvis- nonprogression         Home Medications    Prior to Admission medications   Medication Sig Start Date End Date Taking? Authorizing Provider  lisinopril  (PRINIVIL,ZESTRIL) 20 MG tablet Take 1 tablet (20 mg total) by mouth daily. 04/13/18   Wendee Beavers, DO  varenicline (CHANTIX STARTING MONTH PAK) 0.5 MG X 11 & 1 MG X 42 tablet Take one 0.5 mg tablet by mouth once daily for 3 days, then increase to one 0.5 mg tablet twice daily for 4 days, then increase to one 1 mg tablet twice daily. 04/13/18   Wendee Beavers, DO  albuterol (PROVENTIL HFA;VENTOLIN HFA) 108 (90 Base) MCG/ACT inhaler Inhale 2 puffs into the lungs every 4 (four) hours as needed for wheezing or shortness of breath. Patient not taking: Reported on 04/13/2018 03/29/17 01/21/19  Casey Burkitt, MD    Family History Family History  Problem Relation Age of Onset  . Diabetes Mother   . Hypertension Mother   . Breast cancer Neg Hx     Social History Social History   Tobacco Use  . Smoking status: Current Every Day Smoker    Packs/day: 1.50    Years: 30.00    Pack years: 45.00    Types: Cigarettes  . Smokeless tobacco: Never Used  . Tobacco  comment: Discussed need to quit for baby, asthma and COPD. Information given  Substance Use Topics  . Alcohol use: No  . Drug use: Yes    Types: Marijuana    Comment: patient has stopped     Allergies   Penicillins   Review of Systems Review of Systems  Gastrointestinal: Positive for diarrhea.  All other systems reviewed and are negative.    Physical Exam Triage Vital Signs ED Triage Vitals  Enc Vitals Group     BP      Pulse      Resp      Temp      Temp src      SpO2      Weight      Height      Head Circumference      Peak Flow      Pain Score      Pain Loc      Pain Edu?      Excl. in [place]?    No data found.  Updated Vital Signs BP (!) 150/84 (BP Location: Left Arm)   Pulse 91   Temp 98.8 F (37.1 C) (Oral)   Resp 18   LMP 01/07/2019   SpO2 98%    Physical Exam Vitals signs and nursing note reviewed.  Constitutional:      Appearance: Normal appearance. She is normal weight.  Eyes:      Conjunctiva/sclera: Conjunctivae normal.  Neck:     Musculoskeletal: Normal range of motion and neck supple.  Pulmonary:     Effort: Pulmonary effort is normal.  Musculoskeletal: Normal range of motion.  Skin:    General: Skin is warm and dry.  Neurological:     General: No focal deficit present.     Mental Status: She is alert.  Psychiatric:        Mood and Affect: Mood normal.        Behavior: Behavior normal.      UC Treatments / Results  Labs (all labs ordered are listed, but only abnormal results are displayed) Labs Reviewed  POC SARS CORONAVIRUS 2 AG -  ED    EKG   Radiology No results found.  Procedures Procedures (including critical care time)  Medications Ordered in UC Medications - No data to display  Initial Impression / Assessment and Plan / UC Course  I have reviewed the triage vital signs and the nursing notes.  Pertinent labs & imaging results that were available during my care of the patient were reviewed by me and considered in my medical decision making (see chart for details).    Final Clinical Impressions(s) / UC Diagnoses   Final diagnoses:  Viral syndrome  Diarrhea in adult patient     Discharge Instructions     Vitamin C 500 mg twice daily Vitamin D 5000 IU (125 mcg) daily Zinc 50 mg daily  Listerine mouthwash three times daily    ED Prescriptions    None     PDMP not reviewed this encounter.   Robyn Haber, MD 01/21/19 1606

## 2019-02-13 ENCOUNTER — Encounter: Payer: Self-pay | Admitting: Family Medicine

## 2019-02-13 ENCOUNTER — Other Ambulatory Visit: Payer: Self-pay

## 2019-02-13 ENCOUNTER — Ambulatory Visit (INDEPENDENT_AMBULATORY_CARE_PROVIDER_SITE_OTHER): Payer: BC Managed Care – PPO | Admitting: Family Medicine

## 2019-02-13 VITALS — BP 160/80 | HR 82 | Wt 145.2 lb

## 2019-02-13 DIAGNOSIS — R7989 Other specified abnormal findings of blood chemistry: Secondary | ICD-10-CM

## 2019-02-13 DIAGNOSIS — F17200 Nicotine dependence, unspecified, uncomplicated: Secondary | ICD-10-CM

## 2019-02-13 DIAGNOSIS — I10 Essential (primary) hypertension: Secondary | ICD-10-CM

## 2019-02-13 MED ORDER — LISINOPRIL 20 MG PO TABS: 20.0000 mg | ORAL_TABLET | Freq: Every day | ORAL | 3 refills | Status: DC

## 2019-02-13 MED FILL — SM NICOTINE 4 MG CHEWING GU: 4 | 10 days supply | Qty: 110 | Fill #0

## 2019-02-13 MED FILL — NICOTINE 21 MG/24HR PATCH: 21 | 14 days supply | Qty: 14 | Fill #0

## 2019-02-13 NOTE — Progress Notes (Signed)
Patient's insurance was not active and will not be until 02/18/2019.    Patient will make two appointments to come back for a nurse visit flu shot as well as labs for future.  Christine Mueller, [place]

## 2019-02-13 NOTE — Progress Notes (Signed)
   Subjective:   Patient ID: Christine Mueller    DOB: 1975/07/15, 43 y.o. female   MRN: [id number]  Christine Mueller is a 43 y.o. female with a history of HTN, asthma, COPD, tobacco use disorder here for physical exam.  HTN: BP 160/80. Currently on Lisinopril 20mg  every day but notes she hasnt been taking it. She notes she forgets to take it. Denies any chest pain, SOB, headaches or vision changes.  Asthma: Uses Albuterol PRN. Uses it seldomly.  Denies shortness of breath, cough, wheeze. Has never had PFT's but endorsers a diagnosis of COPD. Current every day smoker.  Tobacco Use: Current every day smoker of 1.5 PPD. She has set a quit date for Jan. 1. She plans to eat whenever she feels desire to smoke. She also has her son and father's dad to hold her accountable. She is interested in the patch.   Low TSH: Patient noted to have low TSH in Feb. 2020. Does not appear to have had any further follow up. Does endorse unintentional weight loss and diarrhea. Denies dry skin, hair loss, palpitations, difficulty sleeping. Denies any trouble swallowing or lumps on neck.   Health Maintenance: Due for flu vaccine  Review of Systems:  Per HPI.   [place], medications and smoking status reviewed.  Objective:   BP (!) 160/80   Pulse 82   Wt 145 lb 3.2 oz (65.9 kg)   LMP 02/08/2019 (Exact Date)   SpO2 99%   BMI 24.92 kg/m  Vitals and nursing note reviewed.  General: pleasant lady, sitting comfortably in exam chair, in no acute distress with non-toxic appearance CV: regular rate and rhythm without murmurs, rubs, or gallops, no lower extremity edema Lungs: clear to auscultation bilaterally with normal work of breathing Abdomen: soft, non-tender, non-distended, normoactive bowel sounds Skin: warm, dry Extremities: warm and well perfused MSK: gait normal  Assessment & Plan:   Primary hypertension Blood pressure elevated today. Currently asymptomatic. Noncompliant with antihypertensives.  Discussed at length importance of patient taking blood pressure medicine. She promised to start taking daily. Phone alarm created during encounter to help with compliance. Will obtain CMP to evaluate kidney function. Follow up in 3 months for blood pressure check.   Tobacco use disorder Long discussion on smoking cessation. Patient has set a quit date. Hesitant to try Chantix at this time due to "potential side effects". Also not interested in the [employer]uitNow resources. She feels "very" confident that she can cut down on her tobacco use. Interested in Nicotine patch. Nicotine patch and gum rx provided with instructions for use provided. RTC in 3 months to follow up on success.  Low TSH level Per chart review, patient has history of low TSH level without any further workup or evaluation. Appears she was lost to follow up. Does endorse some thyroid related symptoms such as unintentional weight loss and diarrhea. Will obtain repeat thyroid labs to evaluate and follow up pending results.    Health Maintenance: Flu shot obtained.   Orders Placed This Encounter  Procedures  . TSH  . T4, Free  . T3, Free  . Comprehensive metabolic panel   Meds ordered this encounter  Medications  . lisinopril (ZESTRIL) 20 MG tablet    Sig: Take 1 tablet (20 mg total) by mouth daily.    Dispense:  90 tablet    Refill:  [place], DO PGY-2, [facility] 02/19/2019 8:56 PM

## 2019-02-13 NOTE — Patient Instructions (Addendum)
I have faxed over a prescription for the nicotine patches. You can pick these up at no charge to you at the [facility]. I believe you can quit! Lets follow up in 3 months to see how things are going.   Please be sure to take your blood pressure medicine, Lisinopril every day.   Please follow up in 1 year, sooner if you need.   Take care and happy holidays!! I hope you and your son have the most amazing Christmas!!

## 2019-02-19 ENCOUNTER — Ambulatory Visit: Payer: Self-pay

## 2019-02-19 ENCOUNTER — Other Ambulatory Visit: Payer: Self-pay

## 2019-02-19 DIAGNOSIS — I10 Essential (primary) hypertension: Secondary | ICD-10-CM | POA: Diagnosis not present

## 2019-02-19 DIAGNOSIS — R7989 Other specified abnormal findings of blood chemistry: Secondary | ICD-10-CM | POA: Insufficient documentation

## 2019-02-19 NOTE — Assessment & Plan Note (Signed)
Per chart review, patient has history of low TSH level without any further workup or evaluation. Appears she was lost to follow up. Does endorse some thyroid related symptoms such as unintentional weight loss and diarrhea. Will obtain repeat thyroid labs to evaluate and follow up pending results.

## 2019-02-19 NOTE — Assessment & Plan Note (Signed)
Blood pressure elevated today. Currently asymptomatic. Noncompliant with antihypertensives. Discussed at length importance of patient taking blood pressure medicine. She promised to start taking daily. Phone alarm created during encounter to help with compliance. Will obtain CMP to evaluate kidney function. Follow up in 3 months for blood pressure check.

## 2019-02-19 NOTE — Assessment & Plan Note (Signed)
Long discussion on smoking cessation. Patient has set a quit date. Hesitant to try Chantix at this time due to "potential side effects". Also not interested in the [employer]QuitNow resources. She feels "very" confident that she can cut down on her tobacco use. Interested in Nicotine patch. Nicotine patch and gum rx provided with instructions for use provided. RTC in 3 months to follow up on success.

## 2019-02-20 LAB — COMPREHENSIVE METABOLIC PANEL
ALT: 18 IU/L (ref 0–32)
AST: 18 IU/L (ref 0–40)
Albumin/Globulin Ratio: 2.2 (ref 1.2–2.2)
Albumin: 4.4 g/dL (ref 3.8–4.8)
Alkaline Phosphatase: 110 IU/L (ref 39–117)
BUN/Creatinine Ratio: 15 (ref 9–23)
BUN: 11 mg/dL (ref 6–24)
Bilirubin Total: 0.3 mg/dL (ref 0.0–1.2)
CO2: 22 mmol/L (ref 20–29)
Calcium: 9.1 mg/dL (ref 8.7–10.2)
Chloride: 106 mmol/L (ref 96–106)
Creatinine, Ser: 0.75 mg/dL (ref 0.57–1.00)
GFR calc Af Amer: 113 mL/min/{1.73_m2} (ref >59)
GFR calc non Af Amer: 98 mL/min/{1.73_m2} (ref >59)
Globulin, Total: 2 g/dL (ref 1.5–4.5)
Glucose: 131 mg/dL — ABNORMAL HIGH (ref 65–99)
Potassium: 3.6 mmol/L (ref 3.5–5.2)
Sodium: 142 mmol/L (ref 134–144)
Total Protein: 6.4 g/dL (ref 6.0–8.5)

## 2019-02-20 LAB — T3, FREE: T3, Free: 3.1 pg/mL (ref 2.0–4.4)

## 2019-02-20 LAB — TSH: TSH: 0.324 u[IU]/mL — ABNORMAL LOW (ref 0.450–4.500)

## 2019-02-20 LAB — T4, FREE: Free T4: 1.16 ng/dL (ref 0.82–1.77)

## 2019-03-07 ENCOUNTER — Encounter: Payer: Self-pay | Admitting: Family Medicine

## 2019-03-07 ENCOUNTER — Telehealth: Payer: Self-pay | Admitting: Family Medicine

## 2019-03-07 NOTE — Telephone Encounter (Signed)
Attempted to contact patient regarding lab results. Unable to leave voicemail. No other phone numbers available to try in chart. Thyroid levels consistent with subclinical hyperthyroidism (low TSH, but normal actual thyroid hormone). Given lack of symptoms, will continue to monitor this at this time. If patient were to call back, please inform her of this.

## 2019-03-12 ENCOUNTER — Encounter: Payer: Self-pay | Admitting: Podiatry

## 2019-03-12 ENCOUNTER — Other Ambulatory Visit: Payer: Self-pay

## 2019-03-12 ENCOUNTER — Ambulatory Visit: Payer: BC Managed Care – PPO | Admitting: Podiatry

## 2019-03-12 VITALS — BP 140/83

## 2019-03-12 DIAGNOSIS — Q828 Other specified congenital malformations of skin: Secondary | ICD-10-CM

## 2019-03-12 DIAGNOSIS — M79672 Pain in left foot: Secondary | ICD-10-CM

## 2019-03-12 DIAGNOSIS — M79675 Pain in left toe(s): Secondary | ICD-10-CM

## 2019-03-12 DIAGNOSIS — L84 Corns and callosities: Secondary | ICD-10-CM | POA: Diagnosis not present

## 2019-03-12 DIAGNOSIS — M2041 Other hammer toe(s) (acquired), right foot: Secondary | ICD-10-CM | POA: Diagnosis not present

## 2019-03-12 DIAGNOSIS — M2042 Other hammer toe(s) (acquired), left foot: Secondary | ICD-10-CM

## 2019-03-12 NOTE — Patient Instructions (Signed)

## 2019-03-15 DIAGNOSIS — Z20828 Contact with and (suspected) exposure to other viral communicable diseases: Secondary | ICD-10-CM | POA: Diagnosis not present

## 2019-03-17 ENCOUNTER — Encounter: Payer: Self-pay | Admitting: Podiatry

## 2019-03-17 NOTE — Progress Notes (Signed)
Subjective: Christine Mueller presents today referred by Danna Hefty, DO for complaint of painful corn(s) left 4th digit and painful callus plantar aspect left foot. Duration >1 month. She has tried foot soaks and pedicures with no relief.  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 She is seeking professional treatment regarding her condition.  Past Medical History:  Diagnosis Date  . Asthma   . COPD (chronic obstructive pulmonary disease) ([place])   . Essential hypertension 12/23/2015  . Gestational diabetes   . Hypertension      Patient Active Problem List   Diagnosis Date Noted  . Low TSH level 02/19/2019  . Primary hypertension 04/12/2017  . COPD (chronic obstructive pulmonary disease) ([place]) 03/12/2015  . Asthma 03/12/2015  . Tobacco use disorder 03/12/2015     Past Surgical History:  Procedure Laterality Date  . CESAREAN SECTION N/A 09/04/2015   Procedure: CESAREAN SECTION;  Surgeon: Florian Buff, MD;  Location: [place];  Service: Obstetrics;  Laterality: N/A;  . wisdom teeth removal       Current Outpatient Medications on File Prior to Visit  Medication Sig Dispense Refill  . lisinopril (ZESTRIL) 20 MG tablet Take 1 tablet (20 mg total) by mouth daily. 90 tablet 3  . varenicline (CHANTIX STARTING MONTH PAK) 0.5 MG X 11 & 1 MG X 42 tablet Take one 0.5 mg tablet by mouth once daily for 3 days, then increase to one 0.5 mg tablet twice daily for 4 days, then increase to one 1 mg tablet twice daily. 53 tablet 0  . [DISCONTINUED] albuterol (PROVENTIL HFA;VENTOLIN HFA) 108 (90 Base) MCG/ACT inhaler Inhale 2 puffs into the lungs every 4 (four) hours as needed for wheezing or shortness of breath. (Patient not taking: Reported on 04/13/2018) 1 Inhaler 0   No current facility-administered medications on file prior to visit.     Allergies  Allergen Reactions  . Penicillins Swelling, Rash and Other (See Comments)    Reaction:   Facial swelling  Has patient had a PCN reaction causing immediate rash, facial/tongue/throat swelling, SOB or lightheadedness with hypotension: Yes Has patient had a PCN reaction causing severe rash involving mucus membranes or skin necrosis: No Has patient had a PCN reaction that required hospitalization No Has patient had a PCN reaction occurring within the last 10 years: No If all of the above answers are "NO", then may proceed with Cephalosporin use.      Family History  Problem Relation Age of Onset  . Diabetes Mother   . Hypertension Mother   . Breast cancer Neg Hx     Social History   Occupational History  . Not on file  Tobacco Use  . Smoking status: Former Smoker    Packs/day: 1.50    Years: 30.00    Pack years: 45.00    Types: Cigarettes    Quit date: 02/05/2019    Years since quitting: 0.1  . Smokeless tobacco: Never Used  . Tobacco comment: Discussed need to quit for baby, asthma and COPD. Information given  Substance and Sexual Activity  . Alcohol use: No  . Drug use: Yes    Types: Marijuana    Comment: patient has stopped  . Sexual activity: Yes    Birth control/protection: None    Immunization History  Administered Date(s) Administered  . Influenza,inj,Quad PF,6+ Mos 03/12/2015, 12/23/2015  . Pneumococcal Polysaccharide-23 09/05/2015  . Tdap 06/19/2015     Objective: Vitals:  03/12/19 1434  BP: 140/83   Vascular Examination:  capillary refill time to digits immediate b/l, palpable DP pulses b/l, palpable PT pulses b/l, pedal hair present b/l and skin temperature gradient within normal limits b/l  Dermatological Examination: Pedal skin with normal turgor, texture and tone bilaterally., No open wounds bilaterally. and No interdigital macerations bilaterally.  Normal nails without lesions.  Hyperkeratotic lesion dorsal aspect left 4th PIPJ. No erythema, no edema, no drainage, no flocculence noted.   Porokeratotic lesion plantarlateral aspect of  left foot with tenderness to palpation. No erythema, no edema, no drainage, no flocculence.  Musculoskeletal: normal muscle strength 5/5 to all lower extremity muscle groups bilaterally, no gross bony deformities bilaterally, no pain crepitus or joint limitation noted with ROM and hammertoes noted to the left, 4th toe  Neurological: sensation intact 5/5 intact bilaterally with 10g monofilament, vibratory sensation intact and proprioception intact bilaterally  Assessment: 1. Acquired hammertoes of both feet   2. Corns   3. Porokeratosis   4. Pain in toe of left foot   5. Left foot pain      Plan: -Discussed diagnoses with conservative vs surgical treatment options available. Discussed periodic debridement of lesions which may not be covered by insurance. We also discussed surgical correction of hammertoe which would alleviate pain from corn. Will refer her to Dr. Allena Katz for surgical consultation for hammertoe left 4th digit and porokeratotic lesion plantarlateral aspect left foot. -Corn(s) left 4th toe pared utilizing sterile scalpel blade without incident.Porokeratosis plantarlateral aspect left foot pared and enucleated with sterile scalpel blade without incident. -Patient to continue soft, supportive shoe gear daily. -Patient to report any pedal injuries to medical professional immediately. -Patient/POA to call should there be question/concern in the interim. -She may follow up with me prn.  No follow-ups on file.

## 2019-03-21 IMAGING — US US OB COMP LESS 14 WK
1 series · 15 of 28 positions shown · non-contrast
Comparison: None.

CLINICAL DATA: Bleeding and pain

EXAM:
OBSTETRIC <14 WK US AND TRANSVAGINAL OB US
TECHNIQUE: Both transabdominal and transvaginal ultrasound examinations were
performed for complete evaluation of the gestation as well as the
maternal uterus, adnexal regions, and pelvic cul-de-sac.
Transvaginal technique was performed to assess early pregnancy.

[Series 1: us ob comp less 14 wk · 15 of 31 slices shown]
[im 1/31]
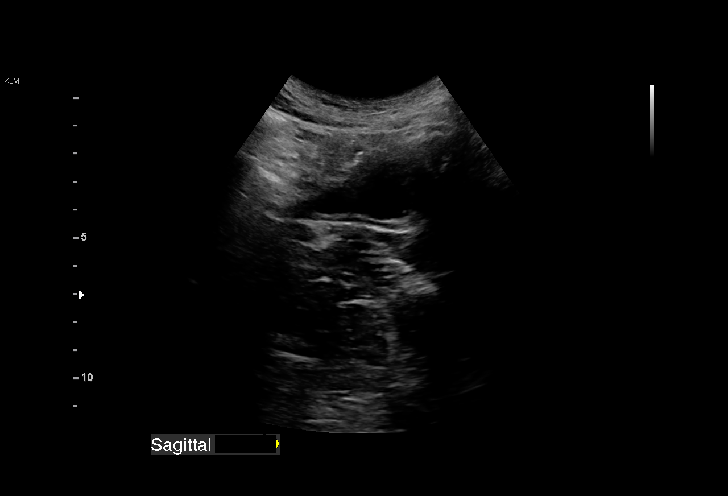
[im 3/31]
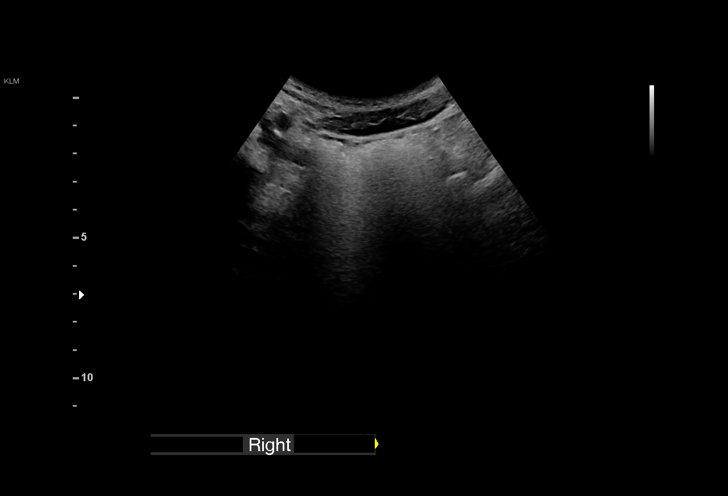
[im 5/31]
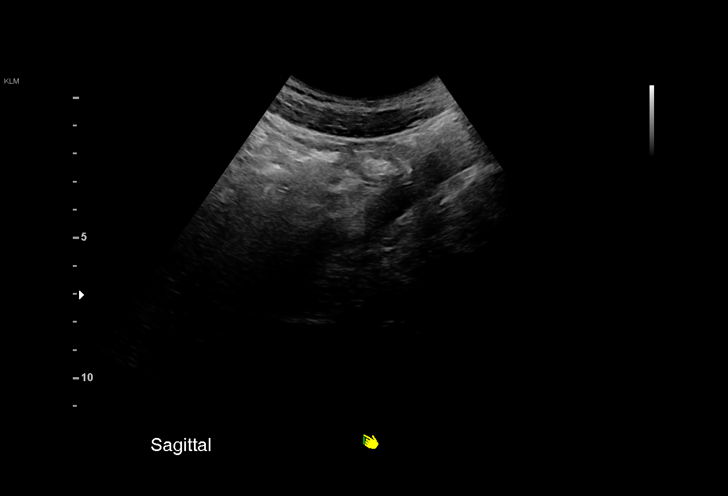
[im 7/31]
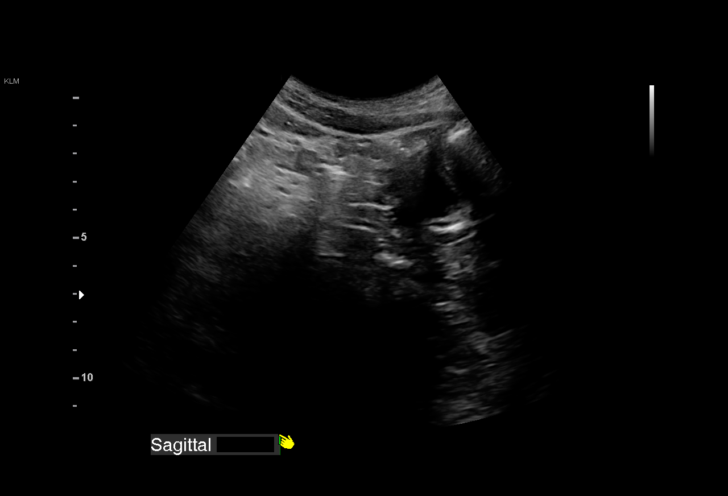
[im 9/31]
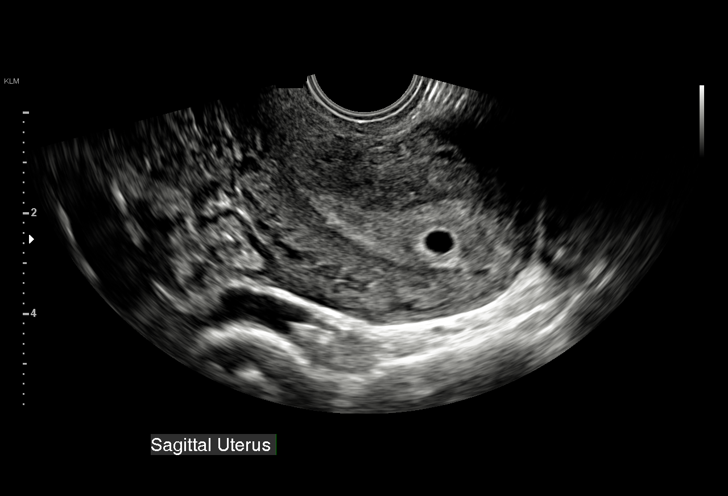
[im 12/31]
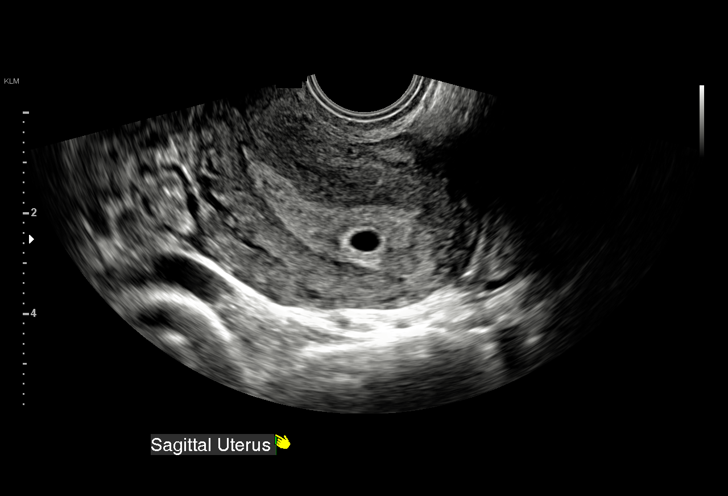
[im 14/31]
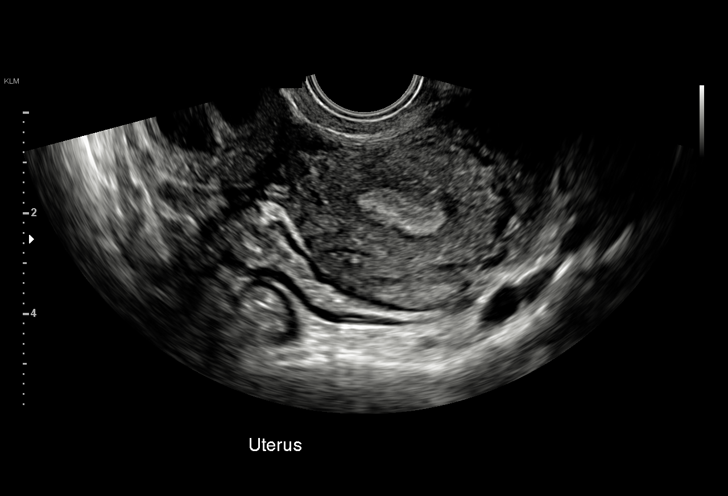
[im 16/31]
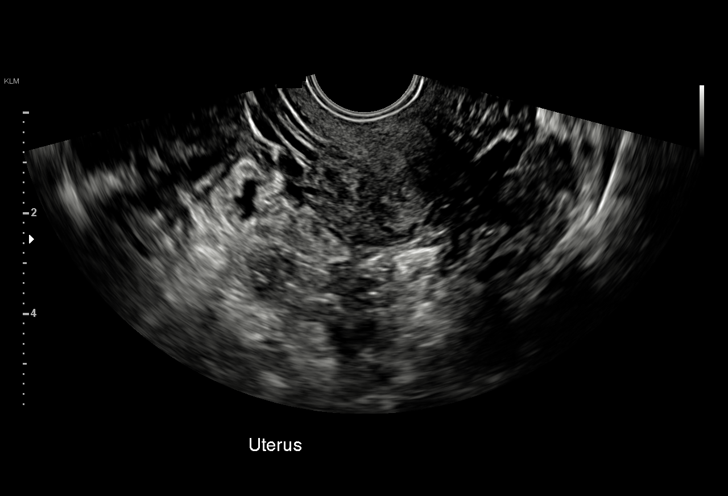
[im 17/31]
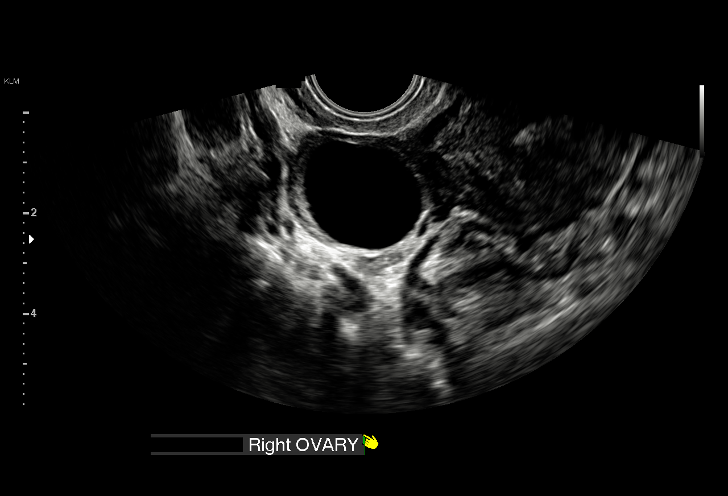
[im 19/31]
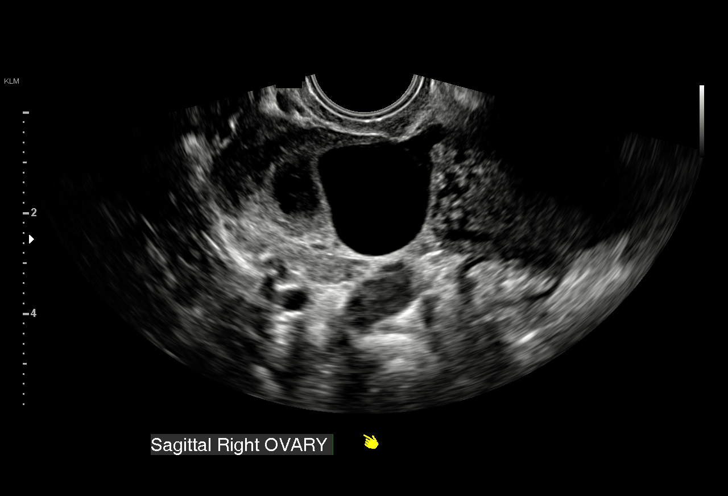
[im 22/31]
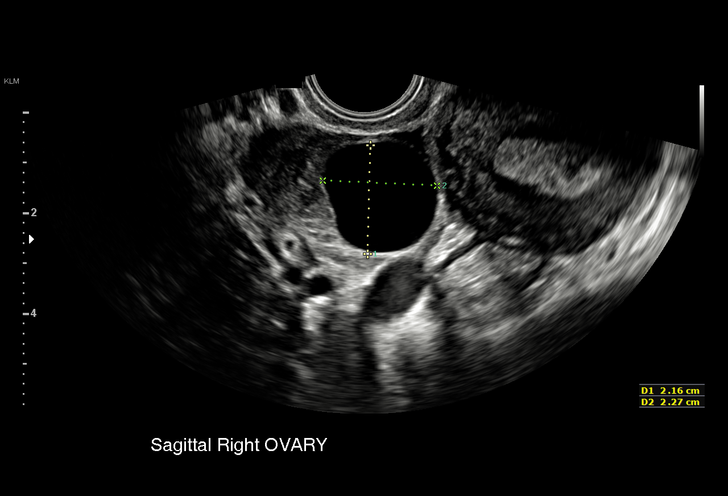
[im 24/31]
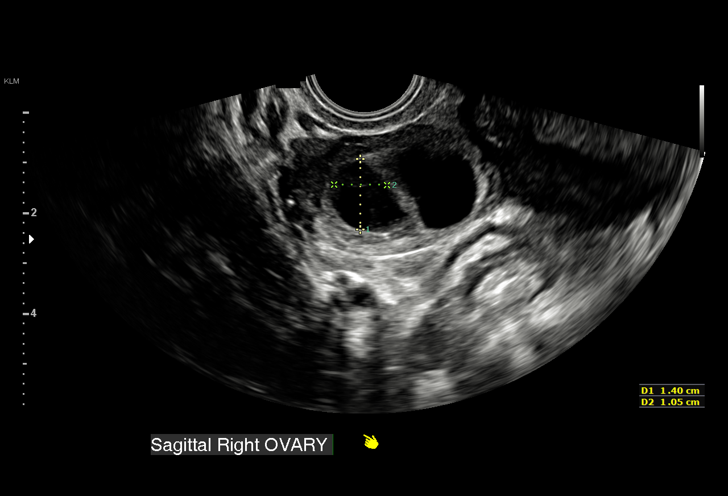
[im 26/31]
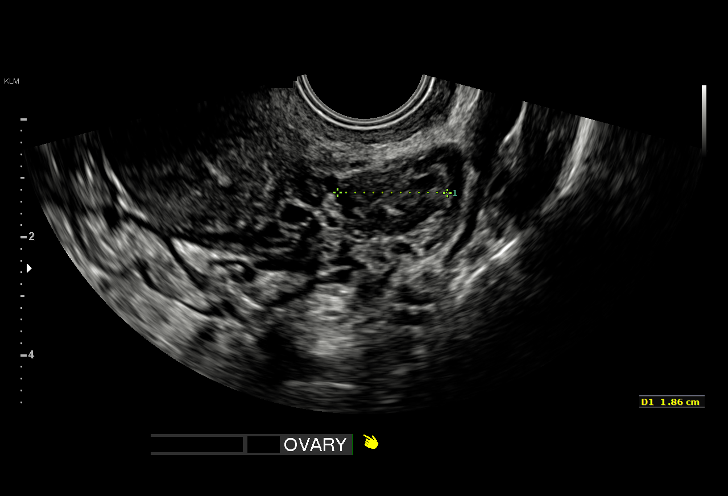
[im 28/31]
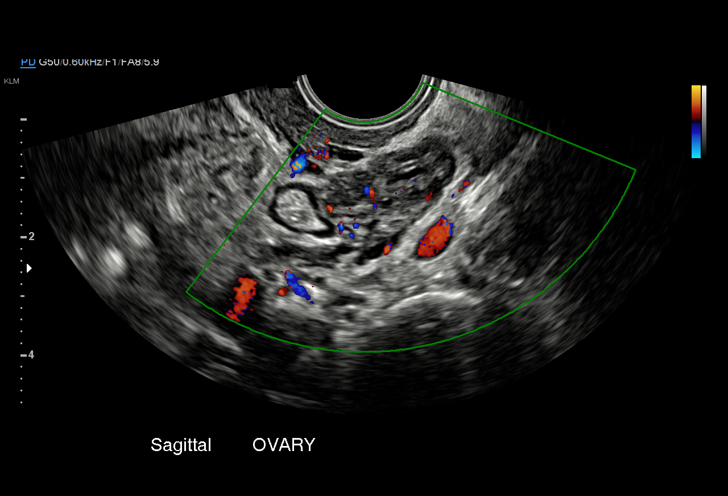
[im 31/31]
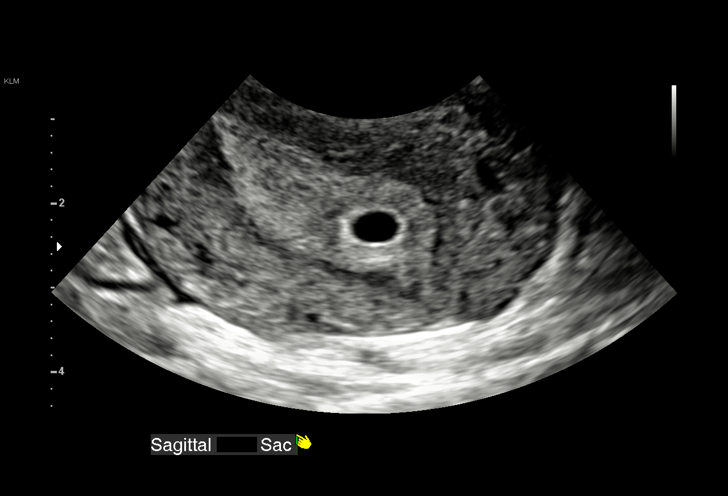

[15 of 28 positions shown; findings below may reference images not displayed]

FINDINGS: Intrauterine gestational sac: Probable intrauterine gestational sac

Yolk sac:  Not visible

Embryo:  Not visible

Cardiac Activity: Not visible

MSD: 5.5  mm   5 w   2  d

Subchorionic hemorrhage:  None visualized.

Maternal uterus/adnexae: Bilateral ovaries are within normal limits.
The left ovary measures 1.9 x 2.2 x 1.5 cm. The right ovary measures
4 x 2.6 x 2.9 cm no significant free fluid.
IMPRESSION: Probable early intrauterine gestational sac, but no yolk sac, fetal
pole, or cardiac activity yet visualized. Recommend follow-up
quantitative B-HCG levels and follow-up US in 14 days to assess
viability. This recommendation follows SRU consensus guidelines:
Diagnostic Criteria for Nonviable Pregnancy Early in the First
Trimester. N Engl J Med 7689; [DATE].

## 2019-03-29 ENCOUNTER — Encounter: Payer: Self-pay | Admitting: Family Medicine

## 2019-03-29 ENCOUNTER — Ambulatory Visit (INDEPENDENT_AMBULATORY_CARE_PROVIDER_SITE_OTHER): Payer: BC Managed Care – PPO | Admitting: Family Medicine

## 2019-03-29 ENCOUNTER — Other Ambulatory Visit: Payer: Self-pay

## 2019-03-29 VITALS — BP 172/104 | HR 66 | Wt 153.4 lb

## 2019-03-29 DIAGNOSIS — Z23 Encounter for immunization: Secondary | ICD-10-CM | POA: Diagnosis not present

## 2019-03-29 DIAGNOSIS — N390 Urinary tract infection, site not specified: Secondary | ICD-10-CM

## 2019-03-29 DIAGNOSIS — R399 Unspecified symptoms and signs involving the genitourinary system: Secondary | ICD-10-CM

## 2019-03-29 DIAGNOSIS — Z789 Other specified health status: Secondary | ICD-10-CM

## 2019-03-29 DIAGNOSIS — I10 Essential (primary) hypertension: Secondary | ICD-10-CM

## 2019-03-29 DIAGNOSIS — Z3169 Encounter for other general counseling and advice on procreation: Secondary | ICD-10-CM

## 2019-03-29 LAB — POCT URINALYSIS DIP (MANUAL ENTRY)
Bilirubin, UA: NEGATIVE
Blood, UA: NEGATIVE
Glucose, UA: NEGATIVE mg/dL
Leukocytes, UA: NEGATIVE
Nitrite, UA: NEGATIVE
Protein Ur, POC: NEGATIVE mg/dL
Spec Grav, UA: 1.025 (ref 1.010–1.025)
Urobilinogen, UA: 0.2 E.U./dL
pH, UA: 6 (ref 5.0–8.0)

## 2019-03-29 MED ORDER — CEPHALEXIN 500 MG PO CAPS: 500.0000 mg | ORAL_CAPSULE | Freq: Two times a day (BID) | ORAL | 0 refills | Status: AC

## 2019-03-29 NOTE — Patient Instructions (Signed)
Thank you for coming to see Christine Mueller today!  We spoke about your UTI symptoms and how the presented without findings of infection on your urinalysis. We used share decision making to treat with antibiotics for a 3 day course.  We also sent a refill for your blood pressure medication to the pharmacy.   Congratulations on quitting smoking!!

## 2019-03-29 NOTE — Progress Notes (Signed)
   CHIEF COMPLAINT / HPI: Urinary tract infection symptoms  Urinary Tract Infection:  - Patient has no prior past medical history of a urinary tract infection  - For the past 3-4 weeks she complains of dysuria, frequency, pungent urine and urgency. In addition, she also notes nonspecific lower back aching with similar onset of urinary symptoms.  - Denies recent constipation, vaginal discharge, fevers, chills or other joint pains  - Her last menstrual period was January 14th and was regular. She is currently sexually active with the same partner and they do not use barrier contraceptive methods - The only recent change patient endorses is that quit smoking around 1 month ago   Hypertension: - Patients blood pressure during the visit was 172/104. - Notes that she has not taken her lisinopril because she needs a refill.   - She usually checks her blood pressure at home her systolic BP is 120-130   PERTINENT  PMH / PSH: Hypertension, COPD, Asthma   OBJECTIVE: BP (!) 172/104   Pulse 66   Wt 153 lb 6.4 oz (69.6 kg)   LMP 03/08/2019 (Exact Date)   SpO2 100%   BMI 26.33 kg/m    Physical Exam  Constitutional: She is well-developed, well-nourished, and in no distress. No distress.  Cardiovascular: Normal rate and regular rhythm. Exam reveals no gallop and no friction rub.  No murmur heard. Pulmonary/Chest: Effort normal and breath sounds normal. She has no wheezes.  Abdominal: Soft. She exhibits no distension. There is no abdominal tenderness. There is no guarding.   ASSESSMENT / PLAN:  Healthcare maintenance:  - received influenza vaccination   Ex-smoker for less than 1 year Quit 30 days ago using nicotine gum  UTI (urinary tract infection) Patient's urinalysis was within normal limits. We used shared decision making and agreed to treat patient for a urinary tract infection despite the urinalysis results. Patient will undergo a 3 day course of 500 mg keflex BID.   Primary  hypertension Lisinopril refilled  Left message with pharmacy to not fill lisinopril Called patient and told to stop lisinopril.  Start amlodipine.  Encounter for preconception consultation When asking about LMP, patient mentioned that she was trying to get pregnant.  When completing chart, I realized that she was on an ACE (black box in preg) and not on a prenatal vit.  Called patient.  Sent new Rxes to pharm   Moses Manners, MD [facility]  I was either physically present and or repeated all elements of the H&PE.  Despite normal UA, we will treat for UTI based on typical symptoms.    Most importantly, she mentioned in passing that she was attempting to get pregnant.  It did not register that she was on lisinopril - black box in pregnancy, until after she left.  I called patient, switched to amlodipine, stopped lisinopril and added a prenatal vitamin.

## 2019-03-29 NOTE — Assessment & Plan Note (Signed)
Patient's urinalysis was within normal limits. We used shared decision making and agreed to treat patient for a urinary tract infection despite the urinalysis results. Patient will undergo a 3 day course of 500 mg keflex BID.

## 2019-03-29 NOTE — Assessment & Plan Note (Addendum)
Lisinopril refilled  Left message with pharmacy to not fill lisinopril Called patient and told to stop lisinopril.  Start amlodipine.

## 2019-03-29 NOTE — Assessment & Plan Note (Addendum)
Quit 30 days ago using nicotine gum

## 2019-03-30 ENCOUNTER — Encounter: Payer: Self-pay | Admitting: Family Medicine

## 2019-03-30 ENCOUNTER — Ambulatory Visit: Payer: BC Managed Care – PPO | Admitting: Podiatry

## 2019-03-30 DIAGNOSIS — Z3169 Encounter for other general counseling and advice on procreation: Secondary | ICD-10-CM | POA: Insufficient documentation

## 2019-03-30 MED ORDER — PRENATAL VITAMINS 28-0.8 MG PO TABS: 1.0000 | ORAL_TABLET | Freq: Every day | ORAL | 3 refills | Status: DC

## 2019-03-30 MED ORDER — AMLODIPINE BESYLATE 5 MG PO TABS: 5.0000 mg | ORAL_TABLET | Freq: Every day | ORAL | 3 refills | Status: DC

## 2019-03-30 NOTE — Assessment & Plan Note (Signed)
When asking about LMP, patient mentioned that she was trying to get pregnant.  When completing chart, I realized that she was on an ACE (black box in preg) and not on a prenatal vit.  Called patient.  Sent new Rxes to [employer] [facility]

## 2019-03-31 IMAGING — US US OB TRANSVAGINAL
1 series · 15 of 28 positions shown · non-contrast
Comparison: 12/31/2016

CLINICAL DATA: Pregnancy of unknown location, bleeding

EXAM:
OBSTETRIC <14 WK ULTRASOUND
TECHNIQUE: Transabdominal ultrasound was performed for evaluation of the
gestation as well as the maternal uterus and adnexal regions.

[Series 1: us ob transvaginal · 43 acquisitions, 15 frames shown]
[im 1/43]
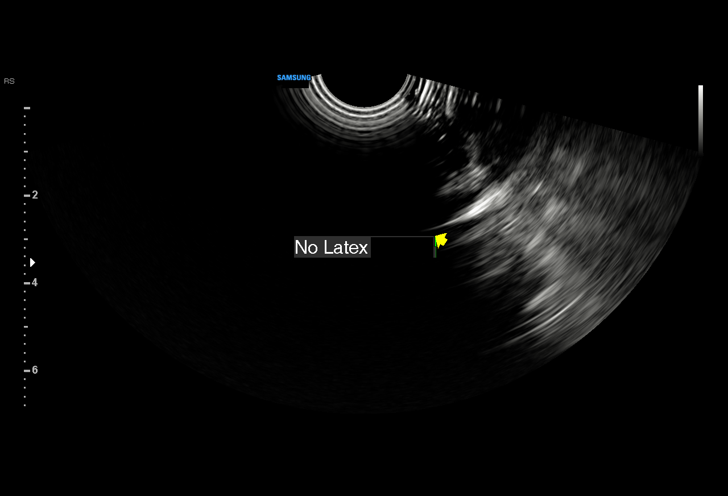
[im 4/43]
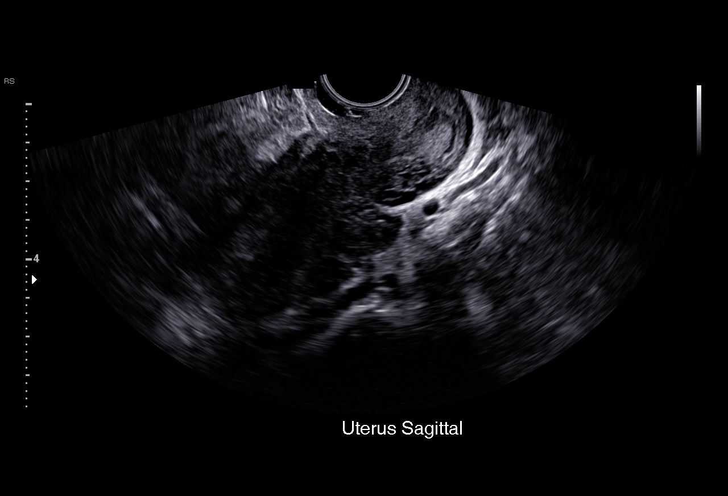
[im 7/43]
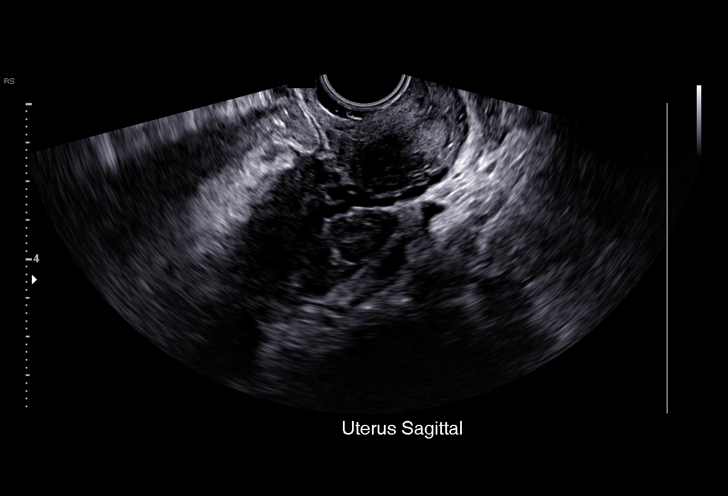
[im 10/43]
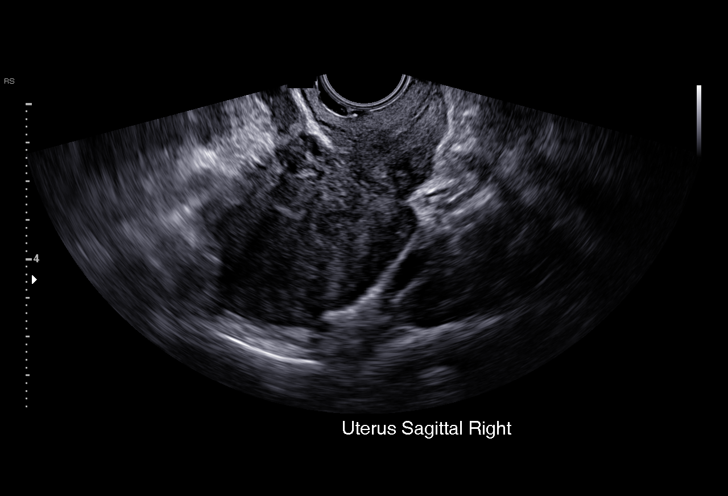
[im 13/43]
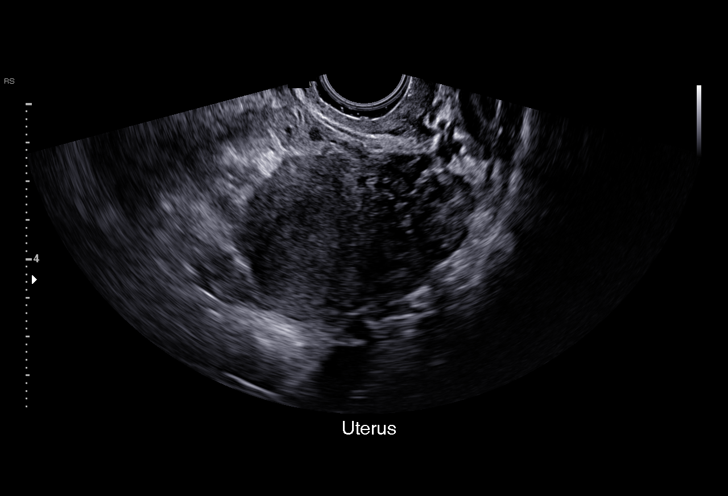
[im 16/43]
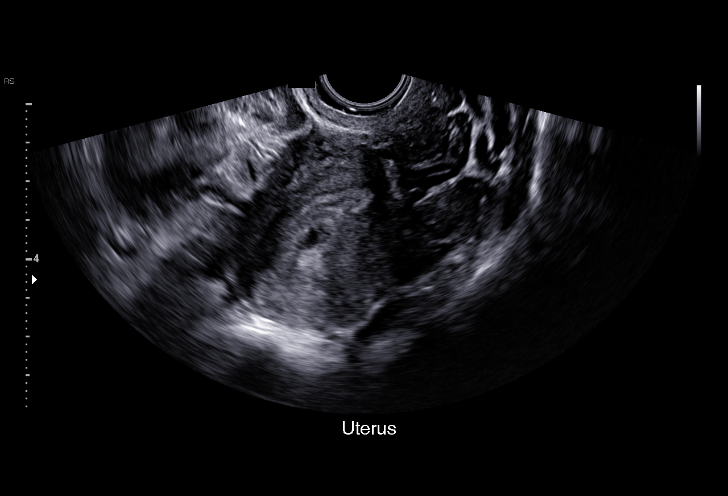
[im 19/43]
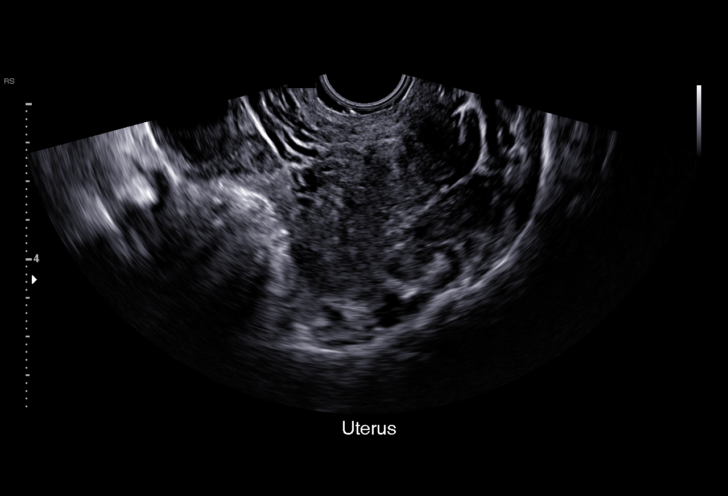
[im 22/43]
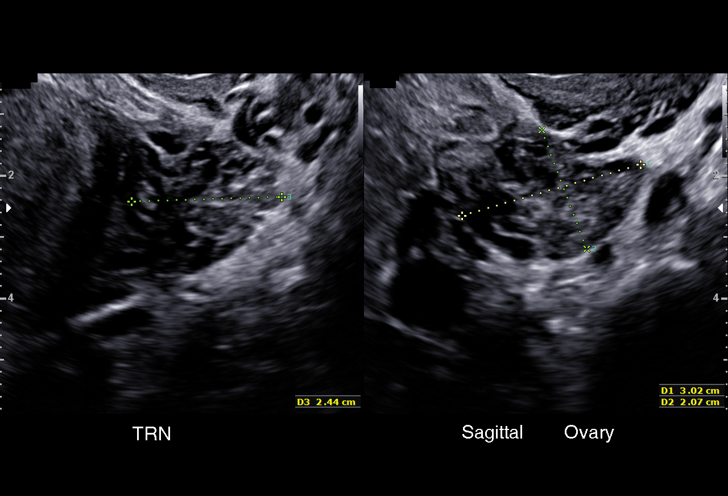
[im 24/43]
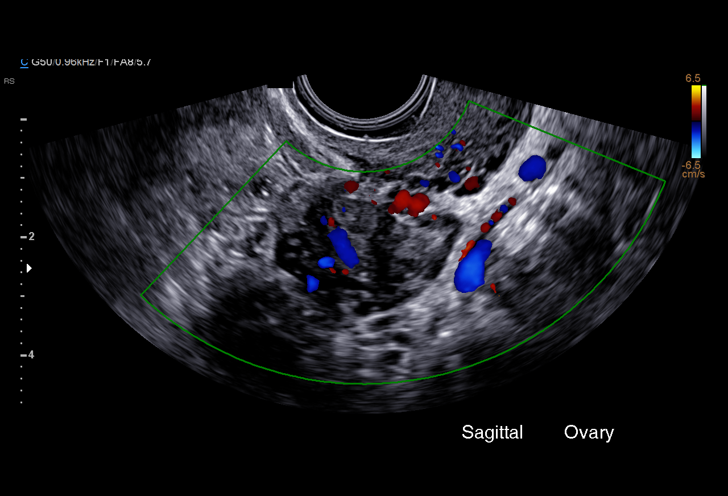
[im 27/43]
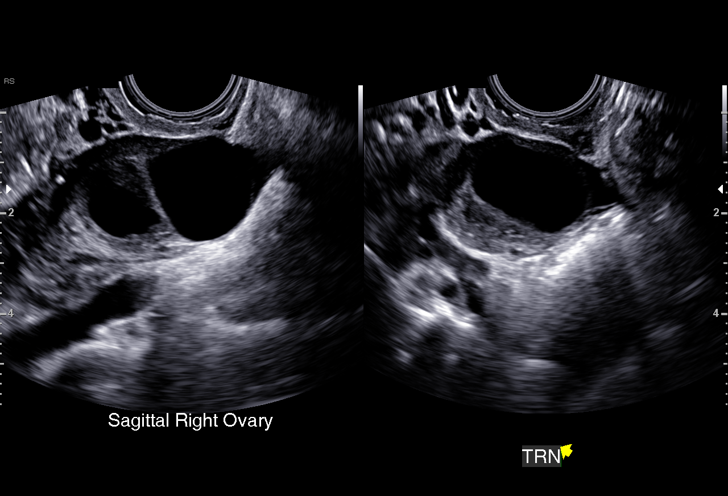
[im 30/43]
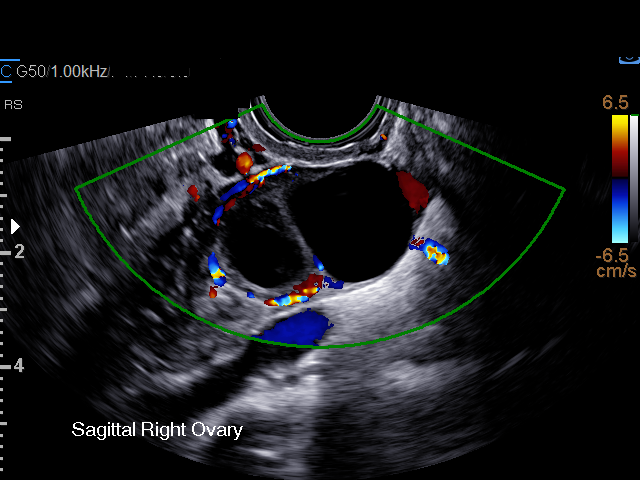
[im 33/43]
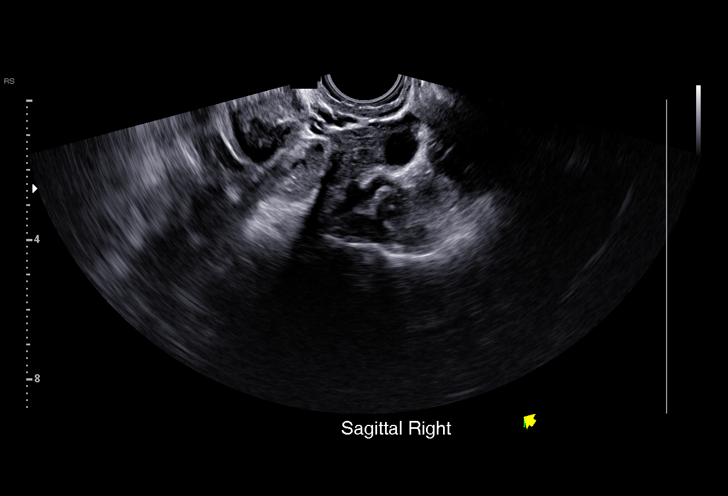
[im 36/43]
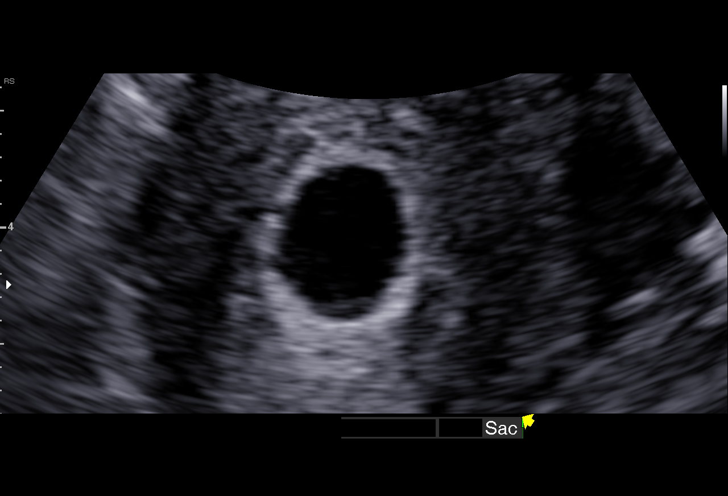
[im 39/43]
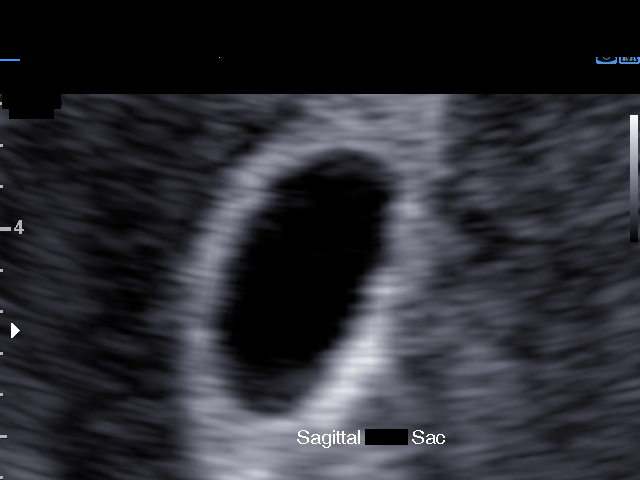
[im 43/43]
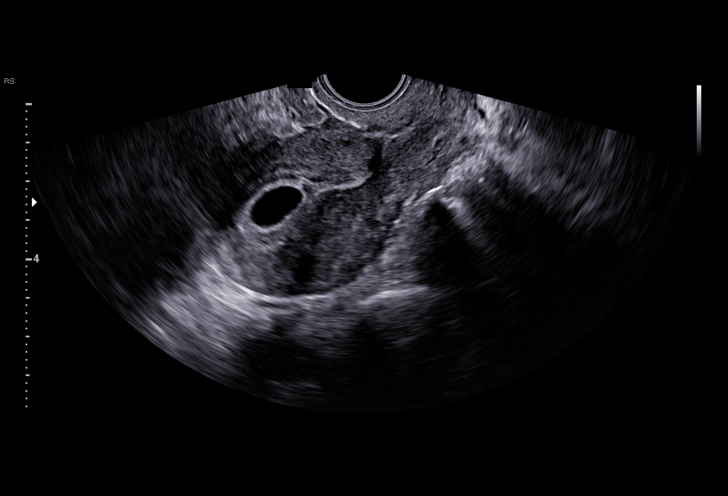

[15 of 28 positions shown; findings below may reference images not displayed]

FINDINGS: Intrauterine gestational sac: Single

Yolk sac:  Not Visualized.

Embryo:  Not Visualized.

MSD: 10.7  mm   5 w   5  d

Subchorionic hemorrhage:  None visualized.

Maternal uterus/adnexae: Suspected 10 x 7 x 10 mm intramural
posterior fundal fibroid.

Right ovary is notable for two dominant cysts/follicles measuring up
to 2.6 cm.

Left ovary is within normal limits.

Trace pelvic fluid in the right adnexa, likely physiologic.
IMPRESSION: Single intrauterine gestational sac, measuring 5 weeks 5 days by
mean sac diameter. No yolk sac or fetal pole is visualized.

Consider follow-up pelvic ultrasound in 14 days to confirm
viability, as clinically warranted.

## 2019-04-06 IMAGING — US US OB TRANSVAGINAL
1 series · 15 of 28 positions shown · non-contrast
Comparison: Pelvic ultrasound 01/10/2017.

CLINICAL DATA: Patient with vaginal bleeding.

EXAM:
TRANSVAGINAL OB ULTRASOUND
TECHNIQUE: Transvaginal ultrasound was performed for complete evaluation of the
gestation as well as the maternal uterus, adnexal regions, and
pelvic cul-de-sac.

[Series 1: us ob transvaginal · 47 acquisitions, 15 frames shown]
[im 1/47]
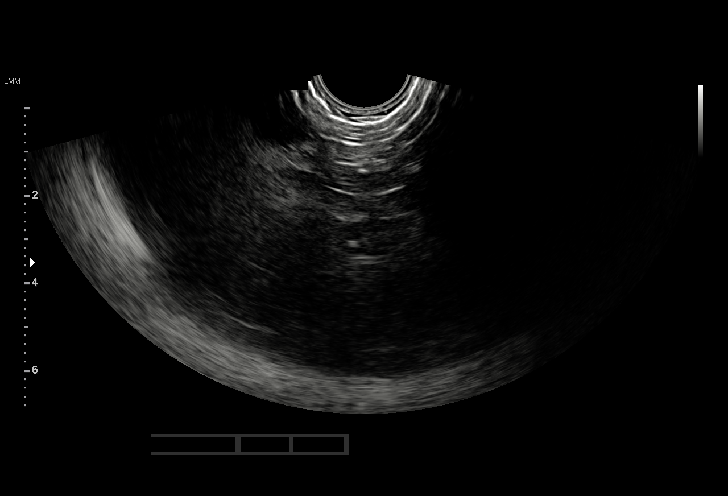
[im 4/47]
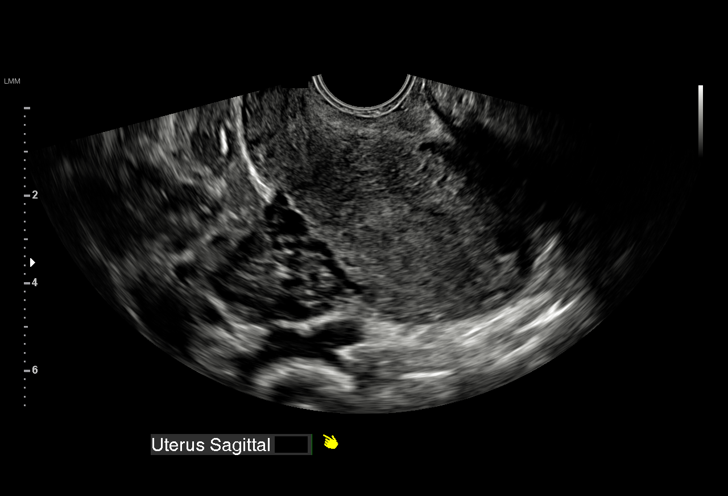
[im 7/47]
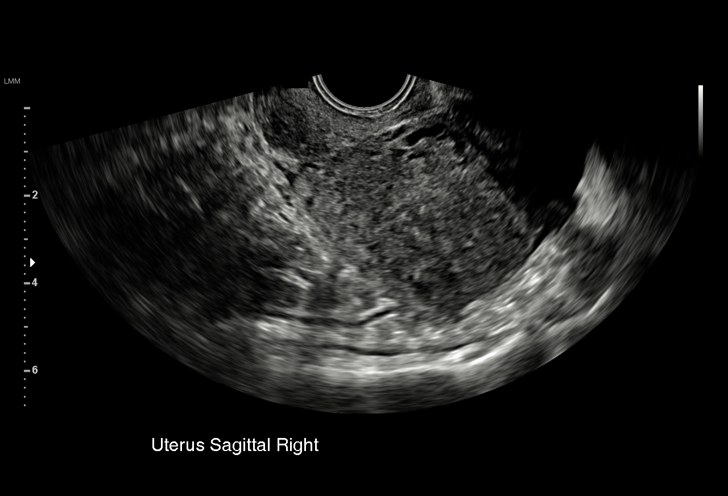
[im 11/47]
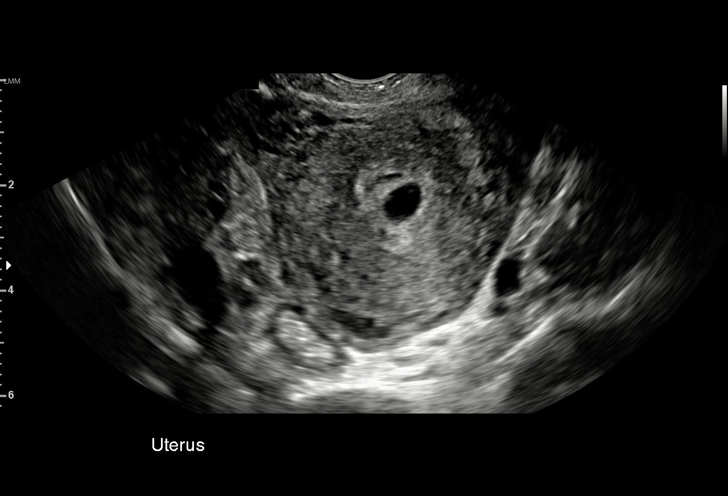
[im 14/47]
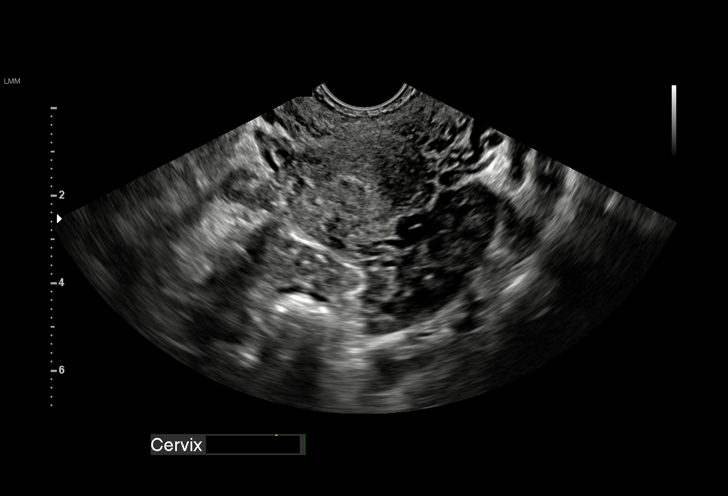
[im 18/47]
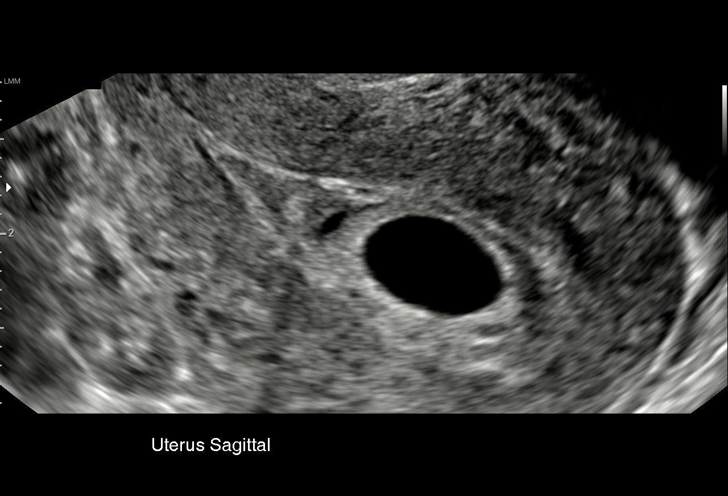
[im 21/47]
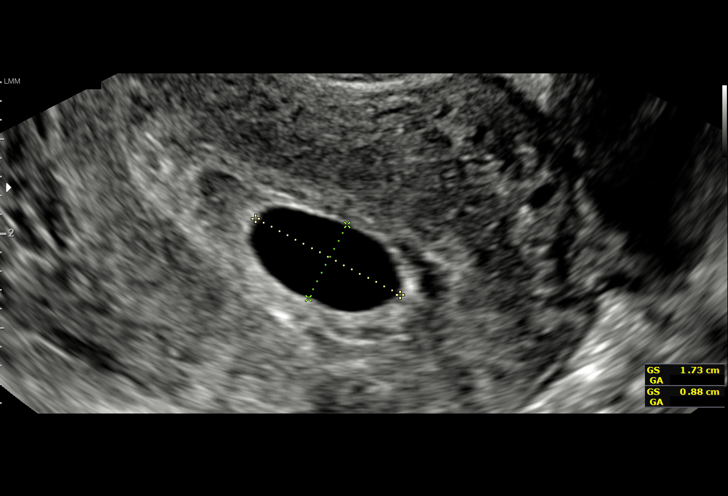
[im 24/47]
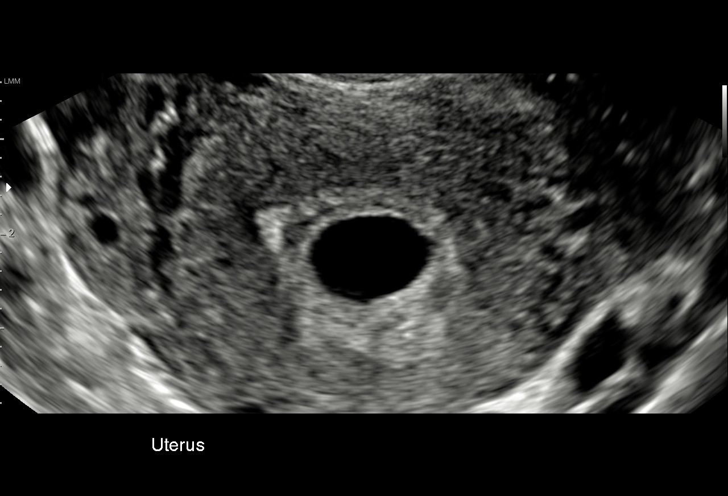
[im 26/47]
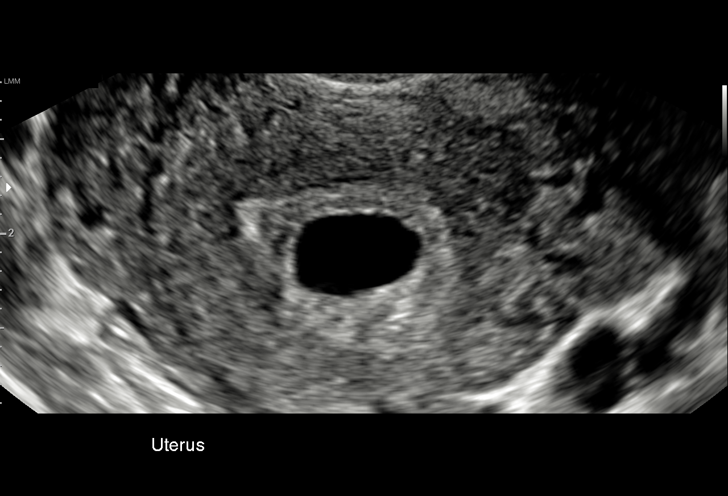
[im 29/47]
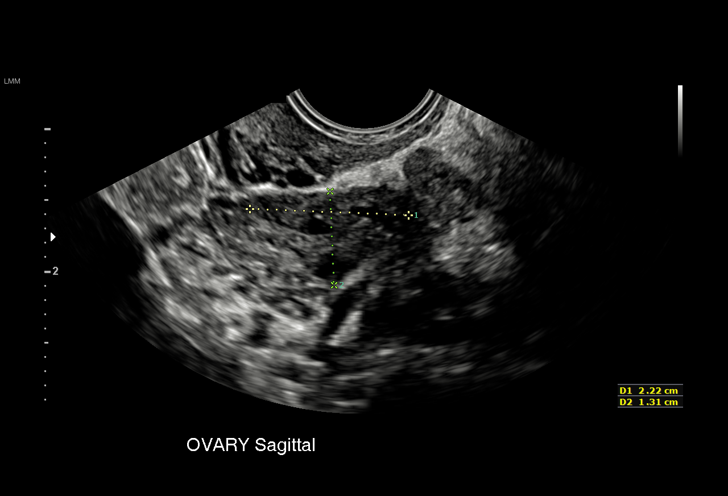
[im 33/47]
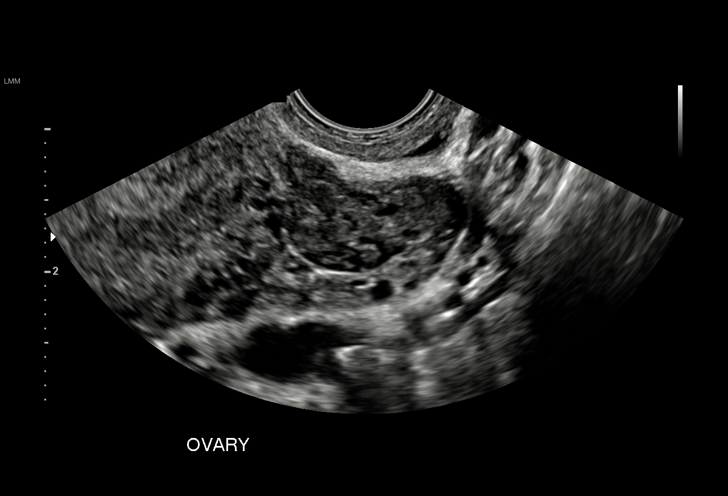
[im 36/47]
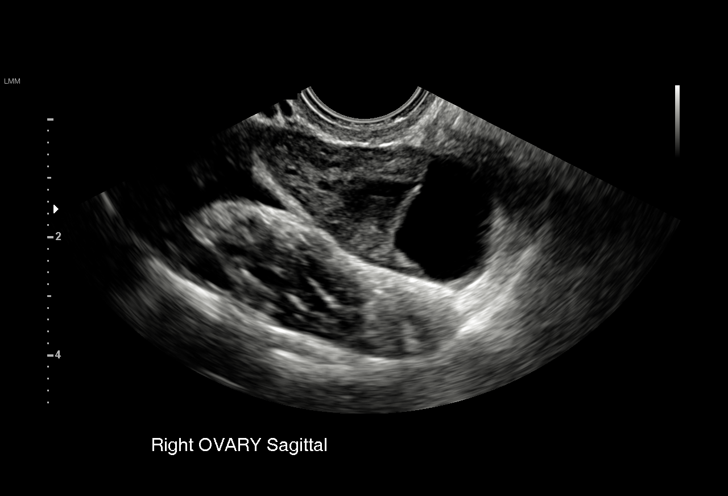
[im 40/47]
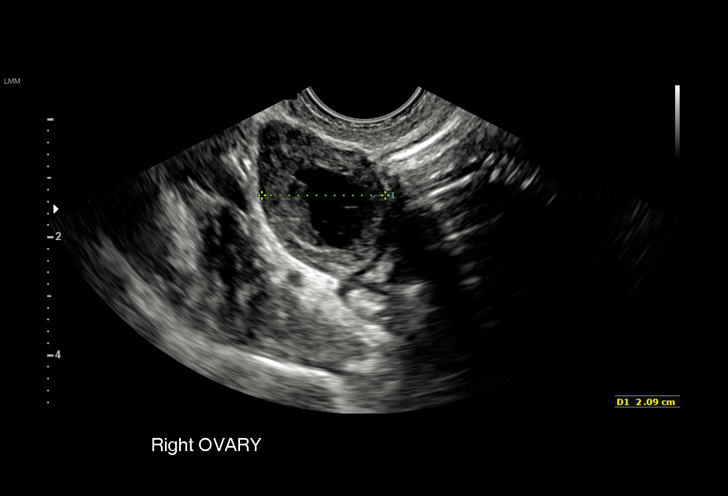
[im 43/47]
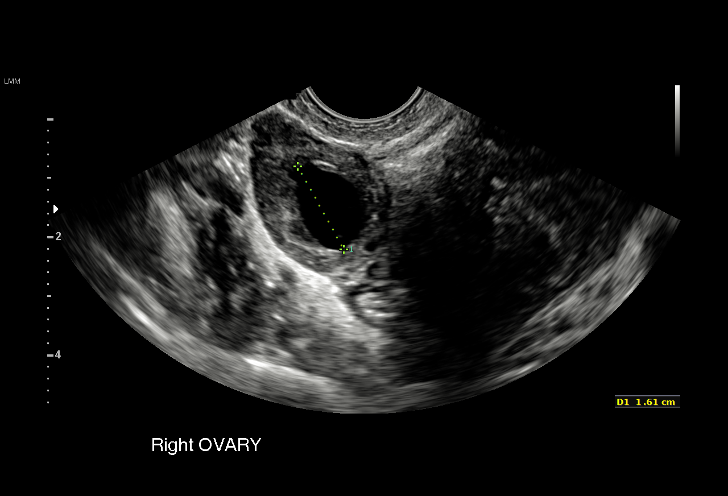
[im 47/47]
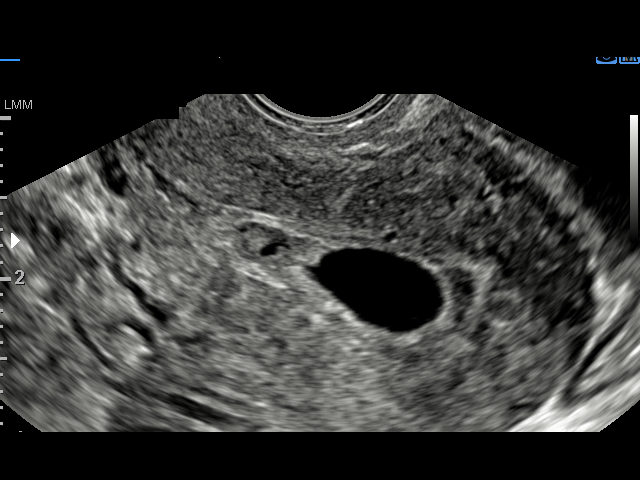

[15 of 28 positions shown; findings below may reference images not displayed]

FINDINGS: Intrauterine gestational sac: Single

Yolk sac:  Not Visualized.

Embryo:  Not Visualized.

Cardiac Activity: Not Visualized.

Heart Rate: 0 bpm

MSD: 13  mm   6 w   0  d

Subchorionic hemorrhage:  Small

Maternal uterus/adnexae: Normal right and left ovaries.
IMPRESSION: Intrauterine gestational sac with no yolk sac or fetal pole.
Recommend follow-up pelvic ultrasound in 9 days to reassess given
initial study with gestational sac and no intrauterine gestation
identified on 01/10/2017.

## 2019-04-20 ENCOUNTER — Ambulatory Visit: Payer: BC Managed Care – PPO | Admitting: Podiatry

## 2019-04-27 ENCOUNTER — Ambulatory Visit
Admission: RE | Admit: 2019-04-27 | Discharge: 2019-04-27 | Disposition: A | Discharge status: Home / Self Care | Payer: 59 | Source: Ambulatory Visit | Attending: Family Medicine | Admitting: Family Medicine

## 2019-04-27 ENCOUNTER — Other Ambulatory Visit: Payer: Self-pay

## 2019-04-27 DIAGNOSIS — Z1231 Encounter for screening mammogram for malignant neoplasm of breast: Secondary | ICD-10-CM

## 2019-05-02 ENCOUNTER — Other Ambulatory Visit: Payer: Self-pay | Admitting: Family Medicine

## 2019-05-02 DIAGNOSIS — R928 Other abnormal and inconclusive findings on diagnostic imaging of breast: Secondary | ICD-10-CM

## 2019-05-03 ENCOUNTER — Ambulatory Visit: Payer: 59 | Admitting: Family Medicine

## 2019-05-03 ENCOUNTER — Other Ambulatory Visit: Payer: Self-pay

## 2019-05-04 ENCOUNTER — Ambulatory Visit (INDEPENDENT_AMBULATORY_CARE_PROVIDER_SITE_OTHER): Payer: Self-pay | Admitting: Family Medicine

## 2019-05-04 ENCOUNTER — Encounter: Payer: Self-pay | Admitting: Family Medicine

## 2019-05-04 ENCOUNTER — Other Ambulatory Visit: Payer: Self-pay

## 2019-05-04 VITALS — BP 144/80 | HR 83 | Ht 64.0 in | Wt 160.4 lb

## 2019-05-04 DIAGNOSIS — N939 Abnormal uterine and vaginal bleeding, unspecified: Secondary | ICD-10-CM

## 2019-05-04 DIAGNOSIS — N6310 Unspecified lump in the right breast, unspecified quadrant: Secondary | ICD-10-CM

## 2019-05-04 LAB — POCT URINE PREGNANCY: Preg Test, Ur: NEGATIVE

## 2019-05-04 NOTE — Patient Instructions (Signed)
Thank you for coming to see me today. It was a pleasure to see you.   Any things cause abnormal periods including stress, too much caffeine, lack of sleep.  Recommend that we continue to monitor this for at least 77months.  If this continues to occur then please schedule follow-up appointment for further evaluation.  I have placed the orders for your breast biopsy and diagnostic mammogram. Please look out for a phone call from them.  If you have any questions or concerns, please do not hesitate to call the office at [phone].  Take Care,   Dr. Orpah Cobb, DO Resident Physician [facility] [phone]

## 2019-05-04 NOTE — Assessment & Plan Note (Signed)
Possible mass in right axilla found on most recent screening mammogram. Uncertain prognosis. Bilateral diagnostic mammogram and ultrasound with US guided core biopsy and aspiration ordered.

## 2019-05-04 NOTE — Progress Notes (Signed)
   Subjective:   Patient ID: Christine Mueller    DOB: [birth year], 44 y.o. female   MRN: [id number]  Christine Mueller is a 44 y.o. female with a history of HTN, asthma, COPD, here for "multiple periods".  Multiple Periods: Patient here today because she has two periods last month. She had a period on 2/11 that lasted 4-5 days of regular flow. She then had another period on 2/26 that lasted 4-5 days of normal flow (using about 1-2 pads a day). She is currently not bleeding. She is not on birth control. This has never happened before. Sexually active with 1 female sexual partner over the last year. They do not use protection. Denies any pelvic pain or pain with intercourse. She is unaware of the onset of menopause history in her family.   Review of Systems:  Per HPI.   PMFSH, medications and smoking status reviewed.  Objective:   BP (!) 144/80   Pulse 83   Ht 5\' 4"  (1.626 m)   Wt 160 lb 6.4 oz (72.8 kg)   LMP 04/20/2019 (Approximate)   SpO2 97%   BMI 27.53 kg/m  Vitals and nursing note reviewed.  General: pleasant younger AA female, well nourished, well developed, in no acute distress with non-toxic appearance, sitting comfortably in exam chair Resp: Breathing comfortably on room air, speaking in full sentences MSK: , gait normal Neuro: Alert and oriented, speech normal  POC Urine pregnancy test today is negative.   Assessment & Plan:   Breast mass, right Possible mass in right axilla found on most recent screening mammogram. Uncertain prognosis. Bilateral diagnostic mammogram and ultrasound with 04/22/2019 guided core biopsy and aspiration ordered.   Abnormal uterine bleeding (AUB) Acute. Uncertain prognosis. Negative pregnancy today. Low concern for symptomatic anemia at this time. She does have a history of miscarriage - uncertain but early period may have been related to miscarriage given unprotected intercourse. Patient does desire pregnancy.  - Reassurance provided. Recommended  patient monitor for three consecutive months and RTC if persists - Reviewed return precautions - recommend daily prenatal vitamin if trying to achieve pregnancy  Orders Placed This Encounter  Procedures  . POCT urine pregnancy   [place], DO PGY-2, [facility] 05/06/2019 10:49 PM

## 2019-05-06 NOTE — Assessment & Plan Note (Signed)
Acute. Uncertain prognosis. Negative pregnancy today. Low concern for symptomatic anemia at this time. She does have a history of miscarriage - uncertain but early period may have been related to miscarriage given unprotected intercourse. Patient does desire pregnancy.  - Reassurance provided. Recommended patient monitor for three consecutive months and RTC if persists - Reviewed return precautions - recommend daily prenatal vitamin if trying to achieve pregnancy

## 2019-05-17 ENCOUNTER — Other Ambulatory Visit: Payer: Self-pay

## 2019-05-23 ENCOUNTER — Ambulatory Visit
Admission: RE | Admit: 2019-05-23 | Discharge: 2019-05-23 | Disposition: A | Discharge status: Home / Self Care | Payer: Self-pay | Source: Ambulatory Visit | Attending: Family Medicine | Admitting: Family Medicine

## 2019-05-23 ENCOUNTER — Other Ambulatory Visit: Payer: Self-pay

## 2019-05-23 ENCOUNTER — Encounter: Payer: Self-pay | Admitting: Family Medicine

## 2019-05-23 DIAGNOSIS — R928 Other abnormal and inconclusive findings on diagnostic imaging of breast: Secondary | ICD-10-CM

## 2019-06-08 ENCOUNTER — Ambulatory Visit: Payer: Self-pay

## 2019-11-02 ENCOUNTER — Encounter: Payer: Self-pay | Admitting: Family Medicine

## 2019-11-02 ENCOUNTER — Ambulatory Visit (INDEPENDENT_AMBULATORY_CARE_PROVIDER_SITE_OTHER): Payer: PRIVATE HEALTH INSURANCE | Admitting: Family Medicine

## 2019-11-02 ENCOUNTER — Other Ambulatory Visit: Payer: Self-pay

## 2019-11-02 VITALS — BP 148/84 | HR 86 | Wt 162.4 lb

## 2019-11-02 DIAGNOSIS — R7989 Other specified abnormal findings of blood chemistry: Secondary | ICD-10-CM | POA: Diagnosis not present

## 2019-11-02 DIAGNOSIS — I10 Essential (primary) hypertension: Secondary | ICD-10-CM | POA: Diagnosis not present

## 2019-11-02 DIAGNOSIS — Z0000 Encounter for general adult medical examination without abnormal findings: Secondary | ICD-10-CM | POA: Diagnosis not present

## 2019-11-02 MED ORDER — HYDROCHLOROTHIAZIDE 25 MG PO TABS: 25.0000 mg | ORAL_TABLET | Freq: Every day | ORAL | 0 refills | Status: DC

## 2019-11-02 NOTE — Patient Instructions (Signed)
Today you were seen for a physical.  Overall you are doing well.   We changed your blood pressure to Hydrocholorothiazide 25 mg daily.   Follow up in 2 weeks for blood pressure check.  I will call you with abnormal blood work results. If normal I will send a letter.   Please call the clinic at [phone] if you have any concerns. It was our pleasure to serve you.  Dr. Salvadore Dom

## 2019-11-02 NOTE — Progress Notes (Signed)
    SUBJECTIVE:   Chief compliant/HPI: annual examination  GAE BIHL is a 44 y.o. who presents today for an annual exam.   History tabs reviewed and updated.   Review of systems unremarkable.    OBJECTIVE:   BP (!) 148/84   Pulse 86   Wt 162 lb 6.4 oz (73.7 kg)   SpO2 97%   BMI 27.88 kg/m   General: Appears well, no acute distress. Age appropriate. Cardiac: RRR, normal heart sounds, no murmurs Respiratory: CTAB, normal effort Abdomen: soft, nontender, nondistended Skin: Warm and dry, no rashes noted Psych: normal affect  ASSESSMENT/PLAN:   Primary hypertension Elevated today in office. Patient endorses wanting to have another child in the near future. -Start HCTZ 25 mg daily -Discontinue Lisinopril -Follow up in 2 weeks for blood pressure check  Low TSH level Asymptomatic. T4/T3 wnl. Previous TSH 0.3.  -Obtain repeat TSH   Annual Examination  See AVS for age appropriate recommendations.   PHQ score 0, reviewed and discussed.  Blood pressure reviewed and plan as above.  Asked about intimate partner violence and resources given as appropriate  The patient currently does not use contraception. Folate recommended as appropriate, minimum of 400 mcg per day.   Considered the following items based upon USPSTF recommendations: Diabetes screening: not indicated at this time Screening for elevated cholesterol: ordered HIV testing: ordered Hepatitis C: ordered Hepatitis B: not indicated Syphilis if at high risk: ordered GC/CT not ordered patient would like it done with next pap smear Cervical cancer screening: prior Pap reviewed, repeat due in 2022 Breast cancer screening: recently done, need to obtain records Colorectal cancer screening: not applicable given age.   Follow up in 1 year or sooner if indicated.    Lavonda Jumbo, DO [facility]

## 2019-11-03 LAB — HIV ANTIBODY (ROUTINE TESTING W REFLEX): HIV Screen 4th Generation wRfx: NONREACTIVE

## 2019-11-03 LAB — HCV INTERPRETATION

## 2019-11-03 LAB — RPR: RPR Ser Ql: NONREACTIVE

## 2019-11-03 LAB — LIPID PANEL
Chol/HDL Ratio: 2.7 ratio (ref 0.0–4.4)
Cholesterol, Total: 162 mg/dL (ref 100–199)
HDL: 59 mg/dL (ref >39)
LDL Chol Calc (NIH): 87 mg/dL (ref 0–99)
Triglycerides: 85 mg/dL (ref 0–149)
VLDL Cholesterol Cal: 16 mg/dL (ref 5–40)

## 2019-11-03 LAB — TSH: TSH: 0.329 u[IU]/mL — ABNORMAL LOW (ref 0.450–4.500)

## 2019-11-03 LAB — HCV AB W REFLEX TO QUANT PCR: HCV Ab: <0.1 s/co ratio (ref 0.0–0.9)

## 2019-11-07 NOTE — Assessment & Plan Note (Signed)
Asymptomatic. T4/T3 wnl. Previous TSH 0.3.  -Obtain repeat TSH

## 2019-11-07 NOTE — Assessment & Plan Note (Signed)
Elevated today in office. Patient endorses wanting to have another child in the near future. -Start HCTZ 25 mg daily -Discontinue Lisinopril -Follow up in 2 weeks for blood pressure check

## 2019-12-18 ENCOUNTER — Ambulatory Visit (INDEPENDENT_AMBULATORY_CARE_PROVIDER_SITE_OTHER): Payer: PRIVATE HEALTH INSURANCE

## 2019-12-18 ENCOUNTER — Other Ambulatory Visit: Payer: Self-pay

## 2019-12-18 DIAGNOSIS — Z23 Encounter for immunization: Secondary | ICD-10-CM | POA: Diagnosis not present

## 2019-12-18 NOTE — Progress Notes (Signed)
Patient presents to nurse clinic for yearly flu vaccination. Administered in RD, site unremarkable, tolerated injection well. Provided patient with updated immunization record and note for work.   Veronda Prude, RN

## 2020-04-11 ENCOUNTER — Other Ambulatory Visit: Payer: Self-pay | Admitting: Family Medicine

## 2020-04-11 DIAGNOSIS — Z1231 Encounter for screening mammogram for malignant neoplasm of breast: Secondary | ICD-10-CM

## 2020-05-30 ENCOUNTER — Ambulatory Visit
Admission: RE | Admit: 2020-05-30 | Discharge: 2020-05-30 | Disposition: A | Discharge status: Home / Self Care | Payer: PRIVATE HEALTH INSURANCE | Source: Ambulatory Visit | Attending: Family Medicine | Admitting: Family Medicine

## 2020-05-30 ENCOUNTER — Other Ambulatory Visit: Payer: Self-pay

## 2020-05-30 DIAGNOSIS — Z1231 Encounter for screening mammogram for malignant neoplasm of breast: Secondary | ICD-10-CM

## 2020-06-02 ENCOUNTER — Encounter: Payer: PRIVATE HEALTH INSURANCE | Admitting: Family Medicine

## 2020-06-18 ENCOUNTER — Other Ambulatory Visit: Payer: Self-pay | Admitting: Family Medicine

## 2020-06-18 DIAGNOSIS — I10 Essential (primary) hypertension: Secondary | ICD-10-CM

## 2020-06-19 NOTE — Telephone Encounter (Signed)
Please have patient follow up with PCP for blood pressure follow up

## 2020-06-20 NOTE — Telephone Encounter (Signed)
I called the number listed in patients chart but did not leave a voicemail due to it being someone with a different name. I am not sure if the number listed is correct.

## 2020-07-01 ENCOUNTER — Other Ambulatory Visit: Payer: Self-pay | Admitting: Family Medicine

## 2020-07-01 DIAGNOSIS — I10 Essential (primary) hypertension: Secondary | ICD-10-CM

## 2020-07-01 NOTE — Telephone Encounter (Signed)
Patient LVM on nurse line to request rx refill on BP medications.   Veronda Prude, RN

## 2020-07-02 NOTE — Telephone Encounter (Signed)
Please inform patient that her Lisinopril was discontinued and she was transitioned to Hydrochlorothiazide. I will provide a refill of this medication however it was recommended she follow up for a BP check shortly after to ensure good blood pressure control with this medication and she never did. Please have her schedule a follow up visit with PCP to check. Thank you!

## 2020-07-02 NOTE — Telephone Encounter (Signed)
Left message with patient's mother to discontinue Lisinopril if she has anymore.  Her HCTZ has been refilled.  Informed her that she needs to schedule an appointment for follow up on her Blood pressure.  Patient's mother verbalized understanding and promises that she will pass it along.  Glennie Hawk, CMA

## 2020-09-01 ENCOUNTER — Other Ambulatory Visit: Payer: Self-pay

## 2020-09-01 ENCOUNTER — Emergency Department (HOSPITAL_COMMUNITY)
Admission: EM | Admit: 2020-09-01 | Discharge: 2020-09-02 | Discharge status: Against Medical Advice | Payer: Medicaid Other | Attending: Emergency Medicine | Admitting: Emergency Medicine

## 2020-09-01 ENCOUNTER — Encounter (HOSPITAL_COMMUNITY): Payer: Self-pay

## 2020-09-01 DIAGNOSIS — R112 Nausea with vomiting, unspecified: Secondary | ICD-10-CM | POA: Insufficient documentation

## 2020-09-01 DIAGNOSIS — I10 Essential (primary) hypertension: Secondary | ICD-10-CM | POA: Insufficient documentation

## 2020-09-01 DIAGNOSIS — Z79899 Other long term (current) drug therapy: Secondary | ICD-10-CM | POA: Insufficient documentation

## 2020-09-01 DIAGNOSIS — R1031 Right lower quadrant pain: Secondary | ICD-10-CM | POA: Diagnosis not present

## 2020-09-01 DIAGNOSIS — J45909 Unspecified asthma, uncomplicated: Secondary | ICD-10-CM | POA: Diagnosis not present

## 2020-09-01 DIAGNOSIS — J449 Chronic obstructive pulmonary disease, unspecified: Secondary | ICD-10-CM | POA: Insufficient documentation

## 2020-09-01 DIAGNOSIS — Z5321 Procedure and treatment not carried out due to patient leaving prior to being seen by health care provider: Secondary | ICD-10-CM | POA: Insufficient documentation

## 2020-09-01 DIAGNOSIS — Z87891 Personal history of nicotine dependence: Secondary | ICD-10-CM | POA: Insufficient documentation

## 2020-09-01 NOTE — ED Provider Notes (Cosign Needed)
Emergency Medicine Provider Triage Evaluation Note  Christine Mueller , a 45 y.o. female  was evaluated in triage.  Pt complains of abd pain that started 2 days ago. .  Review of Systems  Positive: Abd pain, nv Negative: Diarrhea, constipation, fever  Physical Exam  Ht 5\' 4"  (1.626 m)   Wt 72.6 kg   BMI 27.46 kg/m  Gen:   Awake, no distress   Resp:  Normal effort  MSK:   Moves extremities without difficulty  Other:    Medical Decision Making  Medically screening exam initiated at 10:54 PM.  Appropriate orders placed.  was informed that the remainder of the evaluation will be completed by another provider, this initial triage assessment does not replace that evaluation, and the importance of remaining in the ED until their evaluation is complete.     Daneil Dan, PA-C 09/01/20 2255

## 2020-09-01 NOTE — ED Triage Notes (Signed)
Pt complains of right lower quadrant pain, nausea, and vomiting that started today.

## 2020-09-01 NOTE — ED Notes (Signed)
Pt not in room to draw labs

## 2020-09-02 ENCOUNTER — Emergency Department (HOSPITAL_COMMUNITY)
Admission: EM | Admit: 2020-09-02 | Discharge: 2020-09-03 | Disposition: A | Discharge status: Home / Self Care | Payer: Medicaid Other | Attending: Emergency Medicine | Admitting: Emergency Medicine

## 2020-09-02 ENCOUNTER — Encounter (HOSPITAL_COMMUNITY): Payer: Self-pay

## 2020-09-02 ENCOUNTER — Encounter (HOSPITAL_COMMUNITY): Payer: Self-pay | Admitting: Emergency Medicine

## 2020-09-02 ENCOUNTER — Emergency Department (HOSPITAL_COMMUNITY)
Admission: EM | Admit: 2020-09-02 | Discharge: 2020-09-02 | Disposition: A | Discharge status: Home / Self Care | Payer: Medicaid Other | Source: Home / Self Care | Attending: Emergency Medicine | Admitting: Emergency Medicine

## 2020-09-02 ENCOUNTER — Other Ambulatory Visit: Payer: Self-pay

## 2020-09-02 DIAGNOSIS — J45909 Unspecified asthma, uncomplicated: Secondary | ICD-10-CM | POA: Insufficient documentation

## 2020-09-02 DIAGNOSIS — Z87891 Personal history of nicotine dependence: Secondary | ICD-10-CM | POA: Insufficient documentation

## 2020-09-02 DIAGNOSIS — R0789 Other chest pain: Secondary | ICD-10-CM | POA: Diagnosis not present

## 2020-09-02 DIAGNOSIS — E876 Hypokalemia: Secondary | ICD-10-CM | POA: Diagnosis not present

## 2020-09-02 DIAGNOSIS — R110 Nausea: Secondary | ICD-10-CM | POA: Diagnosis not present

## 2020-09-02 DIAGNOSIS — R112 Nausea with vomiting, unspecified: Secondary | ICD-10-CM

## 2020-09-02 DIAGNOSIS — R030 Elevated blood-pressure reading, without diagnosis of hypertension: Secondary | ICD-10-CM

## 2020-09-02 DIAGNOSIS — I10 Essential (primary) hypertension: Secondary | ICD-10-CM | POA: Diagnosis not present

## 2020-09-02 DIAGNOSIS — R109 Unspecified abdominal pain: Secondary | ICD-10-CM | POA: Insufficient documentation

## 2020-09-02 DIAGNOSIS — J449 Chronic obstructive pulmonary disease, unspecified: Secondary | ICD-10-CM | POA: Insufficient documentation

## 2020-09-02 DIAGNOSIS — Z79899 Other long term (current) drug therapy: Secondary | ICD-10-CM | POA: Insufficient documentation

## 2020-09-02 DIAGNOSIS — R231 Pallor: Secondary | ICD-10-CM | POA: Diagnosis not present

## 2020-09-02 DIAGNOSIS — R079 Chest pain, unspecified: Secondary | ICD-10-CM | POA: Diagnosis not present

## 2020-09-02 DIAGNOSIS — D72829 Elevated white blood cell count, unspecified: Secondary | ICD-10-CM | POA: Insufficient documentation

## 2020-09-02 LAB — CBC WITH DIFFERENTIAL/PLATELET
Abs Immature Granulocytes: 0.05 10*3/uL (ref 0.00–0.07)
Basophils Absolute: 0.1 10*3/uL (ref 0.0–0.1)
Basophils Relative: 1 %
Eosinophils Absolute: 0 10*3/uL (ref 0.0–0.5)
Eosinophils Relative: 0 %
HCT: 40.5 % (ref 36.0–46.0)
Hemoglobin: 13.4 g/dL (ref 12.0–15.0)
Immature Granulocytes: 0 %
Lymphocytes Relative: 9 %
Lymphs Abs: 1.1 10*3/uL (ref 0.7–4.0)
MCH: 31.2 pg (ref 26.0–34.0)
MCHC: 33.1 g/dL (ref 30.0–36.0)
MCV: 94.4 fL (ref 80.0–100.0)
Monocytes Absolute: 0.6 10*3/uL (ref 0.1–1.0)
Monocytes Relative: 5 %
Neutro Abs: 10.2 10*3/uL — ABNORMAL HIGH (ref 1.7–7.7)
Neutrophils Relative %: 85 %
Platelets: 292 10*3/uL (ref 150–400)
RBC: 4.29 MIL/uL (ref 3.87–5.11)
RDW: 12.2 % (ref 11.5–15.5)
WBC: 12 10*3/uL — ABNORMAL HIGH (ref 4.0–10.5)
nRBC: 0 % (ref 0.0–0.2)

## 2020-09-02 LAB — URINALYSIS, ROUTINE W REFLEX MICROSCOPIC
Bilirubin Urine: NEGATIVE
Glucose, UA: 150 mg/dL — AB
Hgb urine dipstick: NEGATIVE
Ketones, ur: NEGATIVE mg/dL
Leukocytes,Ua: NEGATIVE
Nitrite: NEGATIVE
Protein, ur: NEGATIVE mg/dL
Specific Gravity, Urine: 1.015 (ref 1.005–1.030)
pH: 8 (ref 5.0–8.0)

## 2020-09-02 LAB — COMPREHENSIVE METABOLIC PANEL
ALT: 25 U/L (ref 0–44)
AST: 27 U/L (ref 15–41)
Albumin: 4.9 g/dL (ref 3.5–5.0)
Alkaline Phosphatase: 93 U/L (ref 38–126)
Anion gap: 12 (ref 5–15)
BUN: 13 mg/dL (ref 6–20)
CO2: 24 mmol/L (ref 22–32)
Calcium: 9.8 mg/dL (ref 8.9–10.3)
Chloride: 103 mmol/L (ref 98–111)
Creatinine, Ser: 0.88 mg/dL (ref 0.44–1.00)
GFR, Estimated: >60 mL/min (ref >60)
Glucose, Bld: 204 mg/dL — ABNORMAL HIGH (ref 70–99)
Potassium: 3.3 mmol/L — ABNORMAL LOW (ref 3.5–5.1)
Sodium: 139 mmol/L (ref 135–145)
Total Bilirubin: 0.6 mg/dL (ref 0.3–1.2)
Total Protein: 8.3 g/dL — ABNORMAL HIGH (ref 6.5–8.1)

## 2020-09-02 LAB — I-STAT BETA HCG BLOOD, ED (MC, WL, AP ONLY): I-stat hCG, quantitative: <5 m[IU]/mL (ref <5)

## 2020-09-02 LAB — LIPASE, BLOOD: Lipase: 25 U/L (ref 11–51)

## 2020-09-02 MED ORDER — CAPSAICIN 0.025 % EX CREA
TOPICAL_CREAM | Freq: Two times a day (BID) | CUTANEOUS | Status: DC
  Filled 2020-09-02: qty 60

## 2020-09-02 MED ORDER — ONDANSETRON 8 MG PO TBDP
8.0000 mg | ORAL_TABLET | Freq: Once | ORAL | Status: AC
  Administered 2020-09-02: 8 mg via ORAL
  Filled 2020-09-02: qty 1

## 2020-09-02 MED ORDER — ONDANSETRON HCL 4 MG PO TABS: 4.0000 mg | ORAL_TABLET | Freq: Four times a day (QID) | ORAL | 0 refills | Status: DC

## 2020-09-02 MED ORDER — SODIUM CHLORIDE 0.9 % IV BOLUS
1000.0000 mL | Freq: Once | INTRAVENOUS | Status: AC
  Administered 2020-09-02: 1000 mL via INTRAVENOUS

## 2020-09-02 MED ORDER — LORAZEPAM 2 MG/ML IJ SOLN
1.0000 mg | Freq: Once | INTRAMUSCULAR | Status: AC
  Administered 2020-09-02: 1 mg via INTRAVENOUS
  Filled 2020-09-02: qty 1

## 2020-09-02 NOTE — ED Triage Notes (Signed)
Patient arrives with same complaints of nausea and hypertension. Patient with difficulty staying awake during triage, difficult to get history.

## 2020-09-02 NOTE — Discharge Instructions (Signed)
You can take the Zofran up to every 6 hours as needed for nausea and vomiting.  Your blood pressure was high in the ED, please make sure you follow a schedule appointment with your PCP regarding your medication.  You should also have a recheck of blood pressure within the next week.

## 2020-09-02 NOTE — ED Notes (Signed)
Pt calling friend for ride home.

## 2020-09-02 NOTE — ED Provider Notes (Signed)
[facility]-EMERGENCY DEPT Provider Note   CSN: [id number] Arrival date & time: 09/02/20  0708     History Chief Complaint  Patient presents with   Emesis   Hypertension    Christine Mueller is a 45 y.o. female.  HPI  Patient presents with intractable vomiting since 2 AM.  Greater than 10 episodes.  No alleviating or aggravating factors that she notes.  States she is also having abdominal cramping during the episodes of emesis.  She has not had any fevers or chills.  States she smokes marijuana daily and also vapes.  Her last menstrual cycle was ended 3 days ago.  She has not had any diarrhea or constipation.  Patient is hypertensive in triage to 227/103.  States she was switched to a new blood pressure medicine 2 months ago.  States she took it this morning at 6 AM and did not vomited up.  She is having a headache, but denies any vision changes.  Past Medical History:  Diagnosis Date   Asthma    COPD (chronic obstructive pulmonary disease) (HCC)    Essential hypertension 12/23/2015   Gestational diabetes    Hypertension     Patient Active Problem List   Diagnosis Date Noted   Breast mass, right 05/04/2019   Low TSH level 02/19/2019   Abnormal uterine bleeding (AUB) 08/22/2017   Primary hypertension 04/12/2017   COPD (chronic obstructive pulmonary disease) (HCC) 03/12/2015   Asthma 03/12/2015   Ex-smoker for less than 1 year 03/12/2015    Past Surgical History:  Procedure Laterality Date   CESAREAN SECTION N/A 09/04/2015   Procedure: CESAREAN SECTION;  Surgeon: Lazaro Arms, MD;  Location: [facility];  Service: Obstetrics;  Laterality: N/A;   wisdom teeth removal       OB History     Gravida  4   Para  1   Term  1   Preterm      AB  1   Living  1      SAB  1   IAB      Ectopic      Multiple  0   Live Births  1        Obstetric Comments  Water broke- infant stress- caught in pelvis- nonprogression          Family History  Problem Relation Age of Onset   Diabetes Mother    Hypertension Mother    Breast cancer Neg Hx     Social History   Tobacco Use   Smoking status: Former    Packs/day: 1.50    Years: 30.00    Pack years: 45.00    Types: Cigarettes    Quit date: 02/05/2019    Years since quitting: 1.5   Smokeless tobacco: Never   Tobacco comments:    Discussed need to quit for baby, asthma and COPD. Information given  Vaping Use   Vaping Use: Every day   Substances: Nicotine, THC, Flavoring  Substance Use Topics   Alcohol use: No   Drug use: Yes    Types: Marijuana    Home Medications Prior to Admission medications   Medication Sig Start Date End Date Taking? Authorizing Provider  ondansetron (ZOFRAN) 4 MG tablet Take 1 tablet (4 mg total) by mouth every 6 (six) hours. 09/02/20  Yes Theron Arista, PA-C  hydrochlorothiazide (HYDRODIURIL) 25 MG tablet TAKE 1 TABLET(25 MG) BY MOUTH DAILY 07/02/20   Mullis, Dara Lords, DO  Prenatal Vit-Fe  Fumarate-FA (PRENATAL VITAMINS) 28-0.8 MG TABS Take 1 tablet by mouth daily. 03/30/19   Moses Manners, MD  albuterol (PROVENTIL HFA;VENTOLIN HFA) 108 (90 Base) MCG/ACT inhaler Inhale 2 puffs into the lungs every 4 (four) hours as needed for wheezing or shortness of breath. Patient not taking: Reported on 04/13/2018 03/29/17 01/21/19  Casey Burkitt, MD    Allergies    Penicillins  Review of Systems   Review of Systems  Gastrointestinal:  Positive for abdominal pain, nausea and vomiting.  Genitourinary:  Negative for dysuria, pelvic pain and vaginal pain.  Neurological:  Positive for headaches.   Physical Exam Updated Vital Signs BP (!) 193/96   Pulse 71   Temp 98.6 F (37 C) (Oral)   Resp 18   Ht 5\' 4"  (1.626 m)   Wt 72.6 kg   LMP 08/30/2020 (Approximate)   SpO2 98%   BMI 27.46 kg/m   Physical Exam Vitals and nursing note reviewed. Exam conducted with a chaperone present.  Constitutional:      Appearance: Normal  appearance.  HENT:     Head: Normocephalic and atraumatic.  Eyes:     General: No scleral icterus.       Right eye: No discharge.        Left eye: No discharge.     Extraocular Movements: Extraocular movements intact.     Pupils: Pupils are equal, round, and reactive to light.  Cardiovascular:     Rate and Rhythm: Normal rate and regular rhythm.     Pulses: Normal pulses.     Heart sounds: Normal heart sounds. No murmur heard.   No friction rub. No gallop.  Pulmonary:     Effort: Pulmonary effort is normal. No respiratory distress.     Breath sounds: Normal breath sounds.  Abdominal:     General: Abdomen is flat. Bowel sounds are normal. There is no distension.     Palpations: Abdomen is soft.     Tenderness: There is abdominal tenderness. There is no guarding.     Hernia: No hernia is present.     Comments: Diffuse tenderness to palpation without guarding or rigidity.  Skin:    General: Skin is warm and dry.     Coloration: Skin is not jaundiced.  Neurological:     Mental Status: She is alert. Mental status is at baseline.     Coordination: Coordination normal.    ED Results / Procedures / Treatments   Labs (all labs ordered are listed, but only abnormal results are displayed) Labs Reviewed  COMPREHENSIVE METABOLIC PANEL - Abnormal; Notable for the following components:      Result Value   Potassium 3.3 (*)    Glucose, Bld 204 (*)    Total Protein 8.3 (*)    All other components within normal limits  URINALYSIS, ROUTINE W REFLEX MICROSCOPIC - Abnormal; Notable for the following components:   Color, Urine STRAW (*)    Glucose, UA 150 (*)    All other components within normal limits  CBC WITH DIFFERENTIAL/PLATELET - Abnormal; Notable for the following components:   WBC 12.0 (*)    Neutro Abs 10.2 (*)    All other components within normal limits  LIPASE, BLOOD  I-STAT BETA HCG BLOOD, ED ([facility], [facility], [facility] ONLY)    EKG None  Radiology No results  found.  Procedures Procedures   Medications Ordered in ED Medications  sodium chloride 0.9 % bolus 1,000 mL (1,000 mLs Intravenous New Bag/Given 09/02/20 0815)  ondansetron (ZOFRAN-ODT) disintegrating tablet 8 mg (8 mg Oral Given 09/02/20 0818)  LORazepam (ATIVAN) injection 1 mg (1 mg Intravenous Given 09/02/20 2297)    ED Course  I have reviewed the triage vital signs and the nursing notes.  Pertinent labs & imaging results that were available during my care of the patient were reviewed by me and considered in my medical decision making (see chart for details).  Clinical Course as of 09/02/20 0933  Tue Sep 02, 2020  9892 I-Stat beta hCG blood, ED Not ectopic  [HS]  0922 Comprehensive metabolic panel(!) No AKI, electrolyte derangement of LTF elevations  [HS]  0923 Lipase: 25 Doubt pancreatiits - no 3x upper limit of normal  [HS]  0923 Urinalysis, Routine w reflex microscopic Urine, Clean Catch(!) No UTI, no blood suggestive of kidney stone. Doubt pyelo [HS]  0923 CBC with Differential(!) No anemia, no elevated WBC concerning for infectious etiology  [HS]  0924 BP has improved since initial intake. Doubt HTN urgency given no kidney injury  [HS]  0926 Patient reports abdominal cramps and nausea have resolved.  She is resting comfortably without any active vomiting. [HS]    Clinical Course User Index [HS] Theron Arista, PA-C   MDM Rules/Calculators/A&P                          Please see ED course for additional labs and further MDM.  Patient reports improvement of her nausea and is no longer actively vomiting.  Her blood pressure has improved from initial intake.  I suspect it is typically elevated, but there were no signs of endorgan damage based on this work-up.  I do not think she needs to be admitted for blood pressure control.  I have advised her to follow-up with her PCP within the next few days.  I suspect the vomiting is due to cannabis hyperemesis. I do not see signs of  emergent pathology based on this workup. She is appropriate for discharge at this time. Discussed plan with patient who voices understanding and agreement with the plan.   Final Clinical Impression(s) / ED Diagnoses Final diagnoses:  Non-intractable vomiting with nausea, unspecified vomiting type  Hypertension, unspecified type    Rx / DC Orders ED Discharge Orders          Ordered    ondansetron (ZOFRAN) 4 MG tablet  Every 6 hours        09/02/20 0925             Theron Arista, PA-C 09/02/20 0933    Terrilee Files, MD 09/02/20 1728

## 2020-09-02 NOTE — ED Triage Notes (Signed)
Patient c/o vomiting at 0500 today. Patient also hypertensive in triage-227/103.

## 2020-09-03 ENCOUNTER — Encounter (HOSPITAL_COMMUNITY): Payer: Self-pay | Admitting: Student

## 2020-09-03 LAB — COMPREHENSIVE METABOLIC PANEL
ALT: 25 U/L (ref 0–44)
AST: 31 U/L (ref 15–41)
Albumin: 5.3 g/dL — ABNORMAL HIGH (ref 3.5–5.0)
Alkaline Phosphatase: 114 U/L (ref 38–126)
Anion gap: 14 (ref 5–15)
BUN: 13 mg/dL (ref 6–20)
CO2: 28 mmol/L (ref 22–32)
Calcium: 10 mg/dL (ref 8.9–10.3)
Chloride: 98 mmol/L (ref 98–111)
Creatinine, Ser: 0.85 mg/dL (ref 0.44–1.00)
GFR, Estimated: >60 mL/min (ref >60)
Glucose, Bld: 157 mg/dL — ABNORMAL HIGH (ref 70–99)
Potassium: 3 mmol/L — ABNORMAL LOW (ref 3.5–5.1)
Sodium: 140 mmol/L (ref 135–145)
Total Bilirubin: 0.5 mg/dL (ref 0.3–1.2)
Total Protein: 9.1 g/dL — ABNORMAL HIGH (ref 6.5–8.1)

## 2020-09-03 LAB — CBC WITH DIFFERENTIAL/PLATELET
Abs Immature Granulocytes: 0.07 10*3/uL (ref 0.00–0.07)
Basophils Absolute: 0.1 10*3/uL (ref 0.0–0.1)
Basophils Relative: 1 %
Eosinophils Absolute: 0 10*3/uL (ref 0.0–0.5)
Eosinophils Relative: 0 %
HCT: 44.9 % (ref 36.0–46.0)
Hemoglobin: 15.2 g/dL — ABNORMAL HIGH (ref 12.0–15.0)
Immature Granulocytes: 0 %
Lymphocytes Relative: 10 %
Lymphs Abs: 1.8 10*3/uL (ref 0.7–4.0)
MCH: 31.5 pg (ref 26.0–34.0)
MCHC: 33.9 g/dL (ref 30.0–36.0)
MCV: 93 fL (ref 80.0–100.0)
Monocytes Absolute: 1.6 10*3/uL — ABNORMAL HIGH (ref 0.1–1.0)
Monocytes Relative: 9 %
Neutro Abs: 14.2 10*3/uL — ABNORMAL HIGH (ref 1.7–7.7)
Neutrophils Relative %: 80 %
Platelets: 321 10*3/uL (ref 150–400)
RBC: 4.83 MIL/uL (ref 3.87–5.11)
RDW: 12.2 % (ref 11.5–15.5)
WBC: 17.7 10*3/uL — ABNORMAL HIGH (ref 4.0–10.5)
nRBC: 0 % (ref 0.0–0.2)

## 2020-09-03 LAB — I-STAT BETA HCG BLOOD, ED (MC, WL, AP ONLY): I-stat hCG, quantitative: <5 m[IU]/mL (ref <5)

## 2020-09-03 LAB — LIPASE, BLOOD: Lipase: 33 U/L (ref 11–51)

## 2020-09-03 MED ORDER — SODIUM CHLORIDE 0.9 % IV BOLUS
1000.0000 mL | Freq: Once | INTRAVENOUS | Status: AC
  Administered 2020-09-03: 1000 mL via INTRAVENOUS

## 2020-09-03 MED ORDER — POTASSIUM CHLORIDE 10 MEQ/100ML IV SOLN
10.0000 meq | Freq: Once | INTRAVENOUS | Status: AC
  Administered 2020-09-03: 10 meq via INTRAVENOUS
  Filled 2020-09-03: qty 100

## 2020-09-03 MED ORDER — HALOPERIDOL LACTATE 5 MG/ML IJ SOLN
2.0000 mg | Freq: Once | INTRAMUSCULAR | Status: AC
  Administered 2020-09-03: 2 mg via INTRAVENOUS
  Filled 2020-09-03: qty 1

## 2020-09-03 MED ORDER — POTASSIUM CHLORIDE CRYS ER 20 MEQ PO TBCR: 20.0000 meq | EXTENDED_RELEASE_TABLET | Freq: Every day | ORAL | 0 refills | Status: DC

## 2020-09-03 MED ORDER — LORAZEPAM 2 MG/ML IJ SOLN
1.0000 mg | Freq: Once | INTRAMUSCULAR | Status: AC
  Administered 2020-09-03: 1 mg via INTRAVENOUS
  Filled 2020-09-03: qty 1

## 2020-09-03 MED ORDER — POTASSIUM CHLORIDE CRYS ER 20 MEQ PO TBCR
40.0000 meq | EXTENDED_RELEASE_TABLET | Freq: Once | ORAL | Status: DC
  Filled 2020-09-03: qty 2

## 2020-09-03 MED ORDER — ONDANSETRON HCL 4 MG/2ML IJ SOLN
4.0000 mg | Freq: Once | INTRAMUSCULAR | Status: AC
  Administered 2020-09-03: 4 mg via INTRAVENOUS
  Filled 2020-09-03: qty 2

## 2020-09-03 MED ORDER — HYDROCHLOROTHIAZIDE 12.5 MG PO CAPS
25.0000 mg | ORAL_CAPSULE | Freq: Once | ORAL | Status: AC
  Administered 2020-09-03: 25 mg via ORAL
  Filled 2020-09-03: qty 2

## 2020-09-03 NOTE — ED Notes (Signed)
Patient has been drinking fluids without any problems.  Patient seems to be in a hurry to get out of the ER.  Falls asleep quickly. Discharge instructions have been given to the patient and verbalizes understanding

## 2020-09-03 NOTE — Discharge Instructions (Addendum)
It was a pleasure taking care of you today. As discussed, your labs were reassuring today. Your white blood cell count was elevated and your potassium was low. Please have them rechecked in 1 week by your PCP. I am sending you home with potassium supplements. Take as prescribed. The provider yesterday sent you home with nausea medication, take as needed for nausea. Your blood pressure was elevated today. You were given you home dose of HCTZ. Have your blood pressure rechecked by your PCP in 1 week. Return to the ER for new or worsening symptoms.

## 2020-09-03 NOTE — ED Provider Notes (Signed)
[facility]-EMERGENCY DEPT Provider Note   CSN: [id number] Arrival date & time: 09/02/20  2236     History Chief Complaint  Patient presents with   Nausea   Hypertension    Christine Mueller is a 45 y.o. female with a history of hypertension, asthma, and COPD who returns to the emergency department with complaints of nausea and vomiting.  Patient states she was seen in the emergency department earlier today for nausea and vomiting that started the day prior, she felt better with interventions in the ED. after leaving the emergency department she began to feel nauseated again, she went to pick up her prescription but it was not ready yet per her report, she states that she subsequently had 2 more episodes of emesis.  She states that her blood pressure was also elevated earlier and she would like this rechecked.  She did not keep down her blood pressure medication today.  No alleviating or aggravating factors to her symptoms. She was having abdominal pain at last visit- none currently.  She denies any fever, hematemesis, melena, hematochezia, diarrhea, dysuria, vaginal bleeding, or vaginal discharge.  She additionally denies headache, change in vision, numbness, weakness, or dizziness.  She smokes marijuana, most recently yesterday.  HPI     Past Medical History:  Diagnosis Date   Asthma    COPD (chronic obstructive pulmonary disease) (HCC)    Essential hypertension 12/23/2015   Gestational diabetes    Hypertension     Patient Active Problem List   Diagnosis Date Noted   Breast mass, right 05/04/2019   Low TSH level 02/19/2019   Abnormal uterine bleeding (AUB) 08/22/2017   Primary hypertension 04/12/2017   COPD (chronic obstructive pulmonary disease) (HCC) 03/12/2015   Asthma 03/12/2015   Ex-smoker for less than 1 year 03/12/2015    Past Surgical History:  Procedure Laterality Date   CESAREAN SECTION N/A 09/04/2015   Procedure: CESAREAN SECTION;  Surgeon:  Lazaro Arms, MD;  Location: [facility];  Service: Obstetrics;  Laterality: N/A;   wisdom teeth removal       OB History     Gravida  4   Para  1   Term  1   Preterm      AB  1   Living  1      SAB  1   IAB      Ectopic      Multiple  0   Live Births  1        Obstetric Comments  Water broke- infant stress- caught in pelvis- nonprogression         Family History  Problem Relation Age of Onset   Diabetes Mother    Hypertension Mother    Breast cancer Neg Hx     Social History   Tobacco Use   Smoking status: Former    Packs/day: 1.50    Years: 30.00    Pack years: 45.00    Types: Cigarettes    Quit date: 02/05/2019    Years since quitting: 1.5   Smokeless tobacco: Never   Tobacco comments:    Discussed need to quit for baby, asthma and COPD. Information given  Vaping Use   Vaping Use: Every day   Substances: Nicotine, THC, Flavoring  Substance Use Topics   Alcohol use: No   Drug use: Yes    Types: Marijuana    Home Medications Prior to Admission medications   Medication Sig Start Date End Date  Taking? Authorizing Provider  hydrochlorothiazide (HYDRODIURIL) 25 MG tablet TAKE 1 TABLET(25 MG) BY MOUTH DAILY 07/02/20   Mullis, Kiersten P, DO  ondansetron (ZOFRAN) 4 MG tablet Take 1 tablet (4 mg total) by mouth every 6 (six) hours. 09/02/20   Theron Arista, PA-C  Prenatal Vit-Fe Fumarate-FA (PRENATAL VITAMINS) 28-0.8 MG TABS Take 1 tablet by mouth daily. 03/30/19   Moses Manners, MD  albuterol (PROVENTIL HFA;VENTOLIN HFA) 108 (90 Base) MCG/ACT inhaler Inhale 2 puffs into the lungs every 4 (four) hours as needed for wheezing or shortness of breath. Patient not taking: Reported on 04/13/2018 03/29/17 01/21/19  Casey Burkitt, MD    Allergies    Penicillins  Review of Systems   Review of Systems  Constitutional:  Negative for chills and fever.  Eyes:  Negative for visual disturbance.  Respiratory:  Negative for shortness of  breath.   Cardiovascular:  Negative for chest pain.  Gastrointestinal:  Positive for nausea and vomiting. Negative for abdominal pain, blood in stool, constipation and diarrhea.  Genitourinary:  Negative for dysuria, vaginal bleeding and vaginal discharge.  Neurological:  Negative for dizziness, syncope, speech difficulty, weakness, numbness and headaches.  All other systems reviewed and are negative.  Physical Exam Updated Vital Signs BP (!) 211/117 (BP Location: Left Arm)   Pulse 76   Temp 98.1 F (36.7 C) (Oral)   Resp 15   Ht 5\' 4"  (1.626 m)   Wt 72.6 kg   LMP 08/30/2020 (Approximate)   SpO2 98%   BMI 27.46 kg/m   Physical Exam Vitals and nursing note reviewed.  Constitutional:      General: She is not in acute distress.    Appearance: She is well-developed. She is not toxic-appearing.  HENT:     Head: Normocephalic and atraumatic.  Eyes:     General:        Right eye: No discharge.        Left eye: No discharge.     Conjunctiva/sclera: Conjunctivae normal.  Cardiovascular:     Rate and Rhythm: Normal rate and regular rhythm.  Pulmonary:     Effort: Pulmonary effort is normal. No respiratory distress.     Breath sounds: Normal breath sounds. No wheezing, rhonchi or rales.  Abdominal:     General: There is no distension.     Palpations: Abdomen is soft.     Tenderness: There is no abdominal tenderness. There is no guarding or rebound.  Musculoskeletal:     Cervical back: Neck supple.  Skin:    General: Skin is warm and dry.     Findings: No rash.  Neurological:     Mental Status: She is alert.     Comments: Clear speech.  CN III through XII grossly intact.  Sensation grossly intact bilateral upper and lower extremities.  5-5 symmetric grip strength.  5 out of 5 strength with plantar dorsiflexion bilaterally.  Intact finger-nose.  Negative pronator drift.  Psychiatric:        Behavior: Behavior normal.    ED Results / Procedures / Treatments   Labs (all labs  ordered are listed, but only abnormal results are displayed) Labs Reviewed - No data to display  EKG None  Radiology No results found.  Procedures Procedures   Medications Ordered in ED Medications - No data to display  ED Course  I have reviewed the triage vital signs and the nursing notes.  Pertinent labs & imaging results that were available during my care of the  patient were reviewed by me and considered in my medical decision making (see chart for details).    MDM Rules/Calculators/A&P                          Patient presents to the ED with complaints of N/V & hypertension.  BP elevated. No focal neuro deficits or headache, no chest pain. Will obtain EKG and plan for abdominal labs. IVFs and zofran ordered.   Additional history obtained:  Additional history obtained from chart review & nursing note review.   Following zofran patient began vomiting- ativan ordered.   EKG: no STEMI.   Lab Tests:  I Ordered, reviewed, and interpreted labs, which included:  CBC: Leukocytosis felt to be nonspecific at this time.  CMP: Hypokalemia & elevated proteins.  Lipase: WNL Preg test: Negative.   ED Course:  Following Ativan patient has not had further vomiting, she states she continues to feel very nauseated, she continues to deny headache or abdominal pain and remains with a nontender abdominal exam without peritoneal signs as well as a normal neurologic exam and is ambulatory.  Presentation does not seem consistent with hypertensive emergency at this time.  Will trial Haldol and reassess, if able to tolerate PO anticipate discharge home. Findings and plan of care discussed with supervising physician Dr. Bebe Shaggy who is in agreement.   06:38: Patient care signed out to [facility] at change of shift pending PO challenge & disposition.   Portions of this note were generated with Scientist, clinical (histocompatibility and immunogenetics). Dictation errors may occur despite best attempts at  proofreading.  Final Clinical Impression(s) / ED Diagnoses Final diagnoses:  None    Rx / DC Orders ED Discharge Orders     None        Cherly Anderson, PA-C 09/03/20 3825    Zadie Rhine, MD 09/03/20 [phone]

## 2020-09-03 NOTE — ED Notes (Signed)
Pt given water for fluid challenge 

## 2020-09-03 NOTE — ED Notes (Signed)
Pt ambulated to restroom without assistance.

## 2020-09-03 NOTE — ED Provider Notes (Signed)
Assumed care from [facility], PA-C at shift change pending Halodol and po challenge.  See her note for full HPI.  In short, patient is a 45 year old female who presents to the ED due to nausea and vomiting.  Patient admits to a few episodes of nonbloody, nonbilious emesis.  Patient evaluated earlier in the ED and was discharged due to symptomatic relief however, patient notes she has been unable to pick up her Zofran and her symptoms returned.  She admits to smoking marijuana frequently.  Denies current abdominal pain.  Emesis is nonbloody, nonbilious.   Plan from previous provider: Reassess patient after haldol. If able to pass po challenge, patient may be discharged home with symptomatic treatment. Patient with leukocytosis of 17,000. Patient has no current abdominal pain. Previous provider had low suspicion for acute abdomen.   Physical Exam  BP (!) 206/106   Pulse 95   Temp 98.1 F (36.7 C) (Oral)   Resp 16   Ht 5\' 4"  (1.626 m)   Wt 72.6 kg   LMP 08/30/2020 (Approximate)   SpO2 100%   BMI 27.46 kg/m   Physical Exam Vitals and nursing note reviewed.  Constitutional:      General: She is not in acute distress.    Appearance: She is not ill-appearing.  HENT:     Head: Normocephalic.  Eyes:     Pupils: Pupils are equal, round, and reactive to light.  Cardiovascular:     Rate and Rhythm: Normal rate and regular rhythm.     Pulses: Normal pulses.     Heart sounds: Normal heart sounds. No murmur heard.   No friction rub. No gallop.  Pulmonary:     Effort: Pulmonary effort is normal.     Breath sounds: Normal breath sounds.  Abdominal:     General: Abdomen is flat. There is no distension.     Palpations: Abdomen is soft.     Tenderness: There is no abdominal tenderness. There is no guarding or rebound.     Comments: Abdomen soft, nondistended, nontender to palpation in all quadrants without guarding or peritoneal signs. No rebound.   Musculoskeletal:        General:  Normal range of motion.     Cervical back: Neck supple.  Skin:    General: Skin is warm and dry.  Neurological:     General: No focal deficit present.     Mental Status: She is alert.  Psychiatric:        Mood and Affect: Mood normal.        Behavior: Behavior normal.    ED Course/Procedures     Procedures  MDM  Assumed care from [facility], PA-C at shift change pending Halodol and po challenge.  See her note for full MDM.  45 year old female presents to the ED due to nausea and vomiting.  Patient evaluated in the ED earlier today however, was unable to get her Zofran and return to the ED due to persistent nausea and vomiting. No abdominal pain.  Upon arrival, patient's BP 211/117 with otherwise normal vitals.  Patient's BP improved during her ED stay to 178/104.  Patient appears to have chronically elevated blood pressure.  She denies headache, blurry vision, chest pain.  Low suspicion for hypertensive emergency/urgency.  Patient given home dose of HCTZ. I have reviewed all labs from previous provider.  CBC significant for leukocytosis at 17.7.  Abdomen soft, nondistended, nontender.  Low suspicion for acute abdomen.  CMP significant for hypokalemia at 3,  hyperglycemia 157.  No anion gap.  Doubt DKA.  Potassium repleted here in the ED.  Pregnancy test negative.  Lipase normal at 33.  Low suspicion for pancreatitis.  8:00 AM reassessed patient at bedside after Haldol and IV fluids.  Patient admits to resolution in symptoms.  She is able to tolerate p.o. without difficulty.  Abdomen soft, nondistended, nontender.  Low suspicion for acute abdomen. Patient states she feels better and is ready to go home.  Patient discharged with potassium per previous provider.  Instructed patient to have potassium and WBC rechecked in 1 week by PCP. Her BP slightly improved while here in the ED. Elevated BP appears chronic. Advised patient to have BP rechecked in 1 week by PCP. patient prescribed  zofran from provider yesterday. She notes she will pick it up on the way home. Strict ED precautions discussed with patient. Patient states understanding and agrees to plan. Patient discharged home in no acute distress and stable vitals.  Patient evaluated by previous attending, Dr. Bebe Shaggy, who agrees with assessment and plan.       Jesusita Oka 09/03/20 5093    Zadie Rhine, MD 09/03/20 2318

## 2020-09-04 ENCOUNTER — Telehealth: Payer: Self-pay

## 2020-09-04 ENCOUNTER — Other Ambulatory Visit: Payer: Self-pay | Admitting: Family Medicine

## 2020-09-04 DIAGNOSIS — I10 Essential (primary) hypertension: Secondary | ICD-10-CM

## 2020-09-04 NOTE — Telephone Encounter (Signed)
This is appropriate management. If patient has BP 180/110 with symptoms such as headache or vision changes and cannot be seen in our clinic, then she needs to be evaluated in ED. Do not take more HCTZ, but follow up in clinic, scheduled 4 days from now.  Shirlean Mylar, MD [facility], PGY-3

## 2020-09-04 NOTE — Telephone Encounter (Signed)
Mother calls nurse line reporting high BP in patient. Mother reports her BP was 170/110 which promoted her to call EMS. Mother reports EMS came out and evaluated her and left. Mother states they gave her the "option" to go to ED, which she refused. Mother states she took HCTZ this am and would like to know if she can take another one. Patient endorses a headache, however denies chest pain, sob, or vision changes. Patient has an apt on 7/18. Strict ED precautions given to patient and mother. Please advise.

## 2020-09-05 ENCOUNTER — Telehealth (INDEPENDENT_AMBULATORY_CARE_PROVIDER_SITE_OTHER): Payer: PRIVATE HEALTH INSURANCE | Admitting: Family Medicine

## 2020-09-05 DIAGNOSIS — I10 Essential (primary) hypertension: Secondary | ICD-10-CM

## 2020-09-05 MED ORDER — LISINOPRIL 10 MG PO TABS: 10.0000 mg | ORAL_TABLET | Freq: Every day | ORAL | 0 refills | Status: DC

## 2020-09-05 NOTE — Assessment & Plan Note (Signed)
BP at home 124/96. Pt requesting to start back on Lisinopril. She feels like her BP was better controlled on Lisinopril. She is no longer TTC. Recommend stopping HCTZ. Have prescribed 10mg  Lisinopril, PCP can increase dose as necessary. Follow up with [facility] on 09/08/20 with Dr 09/10/20 for HTN follow up. Recommend home BP monitoring and BMP 1-2 weeks after starting Lisinopril.

## 2020-09-05 NOTE — Progress Notes (Signed)
[facility] Telemedicine Visit  Patient consented to have virtual visit and was identified by name and date of birth. Method of visit: Video attempted   Encounter participants: Patient: Christine Mueller - located at home Provider: Towanda Octave - located at home   Chief Complaint: medication refill   HPI:  HTN Pt admitted to ER due to severe HTN, nausea and vomiting on 7/12 and 7/13. BP 211/117. Sx likely 2/2 cannabis hyperemesis and chronic HTN. She was given her usual home dose of HCTZ and zofran and discharged. Takes 25mg  HCTZ. Pt has a BP cuff at home. BP took her BP at home which was 124/96. Pt would like a refill of HCTZ.   ROS: per HPI  Pertinent PMHx: COPD, HTN, AUB   Exam:  LMP 08/30/2020 (Approximate)   Respiratory: speaking in full sentences   Assessment/Plan:  Primary hypertension BP at home 124/96. Pt requesting to start back on Lisinopril. She feels like her BP was better controlled on Lisinopril. She is no longer TTC. Recommend stopping HCTZ. Have prescribed 10mg  Lisinopril, PCP can increase dose as necessary. Follow up with [facility] on 09/08/20 with Dr [facility] for HTN follow up. Recommend home BP monitoring and BMP 1-2 weeks after starting Lisinopril.    Time spent during visit with patient: 15 minutes

## 2020-09-06 ENCOUNTER — Other Ambulatory Visit: Payer: Self-pay

## 2020-09-06 ENCOUNTER — Emergency Department (HOSPITAL_COMMUNITY): Payer: Medicaid Other

## 2020-09-06 ENCOUNTER — Encounter (HOSPITAL_COMMUNITY): Payer: Self-pay | Admitting: Emergency Medicine

## 2020-09-06 ENCOUNTER — Emergency Department (HOSPITAL_COMMUNITY)
Admission: EM | Admit: 2020-09-06 | Discharge: 2020-09-06 | Disposition: A | Discharge status: Home / Self Care | Payer: Medicaid Other | Attending: Emergency Medicine | Admitting: Emergency Medicine

## 2020-09-06 DIAGNOSIS — Z87891 Personal history of nicotine dependence: Secondary | ICD-10-CM | POA: Insufficient documentation

## 2020-09-06 DIAGNOSIS — Z79899 Other long term (current) drug therapy: Secondary | ICD-10-CM | POA: Insufficient documentation

## 2020-09-06 DIAGNOSIS — R0789 Other chest pain: Secondary | ICD-10-CM | POA: Diagnosis not present

## 2020-09-06 DIAGNOSIS — R072 Precordial pain: Secondary | ICD-10-CM | POA: Insufficient documentation

## 2020-09-06 DIAGNOSIS — R079 Chest pain, unspecified: Secondary | ICD-10-CM | POA: Diagnosis not present

## 2020-09-06 DIAGNOSIS — R000 Tachycardia, unspecified: Secondary | ICD-10-CM | POA: Diagnosis not present

## 2020-09-06 DIAGNOSIS — J45909 Unspecified asthma, uncomplicated: Secondary | ICD-10-CM | POA: Diagnosis not present

## 2020-09-06 DIAGNOSIS — R1110 Vomiting, unspecified: Secondary | ICD-10-CM | POA: Insufficient documentation

## 2020-09-06 DIAGNOSIS — G4489 Other headache syndrome: Secondary | ICD-10-CM | POA: Diagnosis not present

## 2020-09-06 DIAGNOSIS — R42 Dizziness and giddiness: Secondary | ICD-10-CM | POA: Insufficient documentation

## 2020-09-06 DIAGNOSIS — R109 Unspecified abdominal pain: Secondary | ICD-10-CM | POA: Insufficient documentation

## 2020-09-06 DIAGNOSIS — I10 Essential (primary) hypertension: Secondary | ICD-10-CM | POA: Insufficient documentation

## 2020-09-06 DIAGNOSIS — J449 Chronic obstructive pulmonary disease, unspecified: Secondary | ICD-10-CM | POA: Diagnosis not present

## 2020-09-06 DIAGNOSIS — I959 Hypotension, unspecified: Secondary | ICD-10-CM | POA: Diagnosis not present

## 2020-09-06 DIAGNOSIS — R1013 Epigastric pain: Secondary | ICD-10-CM | POA: Diagnosis not present

## 2020-09-06 LAB — CBC WITH DIFFERENTIAL/PLATELET
Abs Immature Granulocytes: 0.03 10*3/uL (ref 0.00–0.07)
Basophils Absolute: 0.1 10*3/uL (ref 0.0–0.1)
Basophils Relative: 1 %
Eosinophils Absolute: 0.2 10*3/uL (ref 0.0–0.5)
Eosinophils Relative: 2 %
HCT: 46 % (ref 36.0–46.0)
Hemoglobin: 15.9 g/dL — ABNORMAL HIGH (ref 12.0–15.0)
Immature Granulocytes: 0 %
Lymphocytes Relative: 32 %
Lymphs Abs: 2.8 10*3/uL (ref 0.7–4.0)
MCH: 31.1 pg (ref 26.0–34.0)
MCHC: 34.6 g/dL (ref 30.0–36.0)
MCV: 90 fL (ref 80.0–100.0)
Monocytes Absolute: 1.2 10*3/uL — ABNORMAL HIGH (ref 0.1–1.0)
Monocytes Relative: 13 %
Neutro Abs: 4.6 10*3/uL (ref 1.7–7.7)
Neutrophils Relative %: 52 %
Platelets: 301 10*3/uL (ref 150–400)
RBC: 5.11 MIL/uL (ref 3.87–5.11)
RDW: 11.8 % (ref 11.5–15.5)
WBC: 8.8 10*3/uL (ref 4.0–10.5)
nRBC: 0 % (ref 0.0–0.2)

## 2020-09-06 LAB — I-STAT BETA HCG BLOOD, ED (MC, WL, AP ONLY): I-stat hCG, quantitative: <5 m[IU]/mL (ref <5)

## 2020-09-06 LAB — COMPREHENSIVE METABOLIC PANEL
ALT: 27 U/L (ref 0–44)
AST: 42 U/L — ABNORMAL HIGH (ref 15–41)
Albumin: 4.4 g/dL (ref 3.5–5.0)
Alkaline Phosphatase: 85 U/L (ref 38–126)
Anion gap: 12 (ref 5–15)
BUN: 20 mg/dL (ref 6–20)
CO2: 25 mmol/L (ref 22–32)
Calcium: 9.8 mg/dL (ref 8.9–10.3)
Chloride: 95 mmol/L — ABNORMAL LOW (ref 98–111)
Creatinine, Ser: 1.45 mg/dL — ABNORMAL HIGH (ref 0.44–1.00)
GFR, Estimated: 46 mL/min — ABNORMAL LOW (ref >60)
Glucose, Bld: 134 mg/dL — ABNORMAL HIGH (ref 70–99)
Potassium: 4.1 mmol/L (ref 3.5–5.1)
Sodium: 132 mmol/L — ABNORMAL LOW (ref 135–145)
Total Bilirubin: 1.8 mg/dL — ABNORMAL HIGH (ref 0.3–1.2)
Total Protein: 7.3 g/dL (ref 6.5–8.1)

## 2020-09-06 LAB — TROPONIN I (HIGH SENSITIVITY)
Troponin I (High Sensitivity): 46 ng/L — ABNORMAL HIGH (ref <18)
Troponin I (High Sensitivity): 43 ng/L — ABNORMAL HIGH (ref <18)

## 2020-09-06 LAB — LIPASE, BLOOD: Lipase: 37 U/L (ref 11–51)

## 2020-09-06 MED ORDER — ONDANSETRON 4 MG PO TBDP: 4.0000 mg | ORAL_TABLET | Freq: Three times a day (TID) | ORAL | 0 refills | Status: DC | PRN

## 2020-09-06 MED ORDER — FAMOTIDINE IN NACL 20-0.9 MG/50ML-% IV SOLN
20.0000 mg | Freq: Once | INTRAVENOUS | Status: AC
  Administered 2020-09-06: 20 mg via INTRAVENOUS
  Filled 2020-09-06: qty 50

## 2020-09-06 MED ORDER — SODIUM CHLORIDE 0.9 % IV BOLUS
1000.0000 mL | Freq: Once | INTRAVENOUS | Status: AC
  Administered 2020-09-06: 1000 mL via INTRAVENOUS

## 2020-09-06 MED ORDER — PROMETHAZINE HCL 25 MG RE SUPP: 25.0000 mg | Freq: Four times a day (QID) | RECTAL | 0 refills | Status: DC | PRN

## 2020-09-06 NOTE — ED Provider Notes (Signed)
[facility] EMERGENCY DEPARTMENT Provider Note   CSN: [id number] Arrival date & time: 09/06/20  1244     History Chief Complaint  Patient presents with   Chest Pain    Christine Mueller is a 45 y.o. female.  She is brought in by EMS for evaluation of chest pressure nausea vomiting feeling generally weak.  Blood pressure was reportedly low.  Patient was given 1 sublingual nitro by EMS.  Recently started new blood pressure medicine.  Was seen 5 days ago for nausea and vomiting.  She said she has not vomited since yesterday.  The history is provided by the patient.  Chest Pain Pain location:  Substernal area Pain quality: tightness   Pain radiates to:  Epigastrium Pain severity:  Moderate Duration:  4 hours Timing:  Constant Progression:  Improving Chronicity:  New Relieved by:  None tried Worsened by:  Nothing Ineffective treatments:  None tried Associated symptoms: abdominal pain and nausea   Associated symptoms: no cough, no fever, no headache, no lower extremity edema and no shortness of breath       Past Medical History:  Diagnosis Date   Asthma    COPD (chronic obstructive pulmonary disease) (HCC)    Essential hypertension 12/23/2015   Gestational diabetes    Hypertension     Patient Active Problem List   Diagnosis Date Noted   Breast mass, right 05/04/2019   Low TSH level 02/19/2019   Abnormal uterine bleeding (AUB) 08/22/2017   Primary hypertension 04/12/2017   COPD (chronic obstructive pulmonary disease) (HCC) 03/12/2015   Asthma 03/12/2015   Ex-smoker for less than 1 year 03/12/2015    Past Surgical History:  Procedure Laterality Date   CESAREAN SECTION N/A 09/04/2015   Procedure: CESAREAN SECTION;  Surgeon: Lazaro Arms, MD;  [facility];  Service: Obstetrics;  Laterality: N/A;   wisdom teeth removal       OB History     Gravida  4   Para  1   Term  1   Preterm      AB  1   Living  1      SAB  1    IAB      Ectopic      Multiple  0   Live Births  1        Obstetric Comments  Water broke- infant stress- caught in pelvis- nonprogression         Family History  Problem Relation Age of Onset   Diabetes Mother    Hypertension Mother    Breast cancer Neg Hx     Social History   Tobacco Use   Smoking status: Former    Packs/day: 1.50    Years: 30.00    Pack years: 45.00    Types: Cigarettes    Quit date: 02/05/2019    Years since quitting: 1.5   Smokeless tobacco: Never   Tobacco comments:    Discussed need to quit for baby, asthma and COPD. Information given  Vaping Use   Vaping Use: Every day   Substances: Nicotine, THC, Flavoring  Substance Use Topics   Alcohol use: No   Drug use: Yes    Types: Marijuana    Home Medications Prior to Admission medications   Medication Sig Start Date End Date Taking? Authorizing Provider  hydrochlorothiazide (HYDRODIURIL) 25 MG tablet TAKE 1 TABLET(25 MG) BY MOUTH DAILY 07/02/20   Mullis, Kiersten P, DO  lisinopril (ZESTRIL) 10 MG tablet  Take 1 tablet (10 mg total) by mouth at bedtime. 09/05/20 10/05/20  Towanda Octave, MD  ondansetron (ZOFRAN) 4 MG tablet Take 1 tablet (4 mg total) by mouth every 6 (six) hours. 09/02/20   Theron Arista, PA-C  potassium chloride SA (KLOR-CON) 20 MEQ tablet Take 1 tablet (20 mEq total) by mouth daily. 09/03/20   Petrucelli, Pleas Koch, PA-C  Prenatal Vit-Fe Fumarate-FA (PRENATAL VITAMINS) 28-0.8 MG TABS Take 1 tablet by mouth daily. 03/30/19   Moses Manners, MD  albuterol (PROVENTIL HFA;VENTOLIN HFA) 108 (90 Base) MCG/ACT inhaler Inhale 2 puffs into the lungs every 4 (four) hours as needed for wheezing or shortness of breath. Patient not taking: Reported on 04/13/2018 03/29/17 01/21/19  Casey Burkitt, MD    Allergies    Penicillins  Review of Systems   Review of Systems  Constitutional:  Negative for fever.  HENT:  Negative for sore throat.   Eyes:  Negative for visual disturbance.   Respiratory:  Negative for cough and shortness of breath.   Cardiovascular:  Positive for chest pain.  Gastrointestinal:  Positive for abdominal pain and nausea.  Genitourinary:  Negative for dysuria.  Musculoskeletal:  Negative for neck pain.  Skin:  Negative for rash.  Neurological:  Negative for headaches.   Physical Exam Updated Vital Signs BP 93/71   Pulse (!) 102   Temp 98.1 F (36.7 C)   Resp 13   Ht 5\' 4"  (1.626 m)   Wt 54.4 kg   LMP 08/30/2020 (Approximate)   SpO2 92%   BMI 20.60 kg/m   Physical Exam Vitals and nursing note reviewed.  Constitutional:      General: She is not in acute distress.    Appearance: She is well-developed.  HENT:     Head: Normocephalic and atraumatic.  Eyes:     Conjunctiva/sclera: Conjunctivae normal.  Cardiovascular:     Rate and Rhythm: Normal rate and regular rhythm.     Heart sounds: Normal heart sounds. No murmur heard. Pulmonary:     Effort: Pulmonary effort is normal. No respiratory distress.     Breath sounds: Normal breath sounds.  Abdominal:     Palpations: Abdomen is soft. There is no mass.     Tenderness: There is no abdominal tenderness.  Musculoskeletal:        General: Normal range of motion.     Cervical back: Neck supple.     Right lower leg: No tenderness. No edema.     Left lower leg: No tenderness. No edema.  Skin:    General: Skin is warm and dry.  Neurological:     General: No focal deficit present.     Mental Status: She is alert.    ED Results / Procedures / Treatments   Labs (all labs ordered are listed, but only abnormal results are displayed) Labs Reviewed  COMPREHENSIVE METABOLIC PANEL - Abnormal; Notable for the following components:      Result Value   Sodium 132 (*)    Chloride 95 (*)    Glucose, Bld 134 (*)    Creatinine, Ser 1.45 (*)    AST 42 (*)    Total Bilirubin 1.8 (*)    GFR, Estimated 46 (*)    All other components within normal limits  CBC WITH DIFFERENTIAL/PLATELET -  Abnormal; Notable for the following components:   Hemoglobin 15.9 (*)    Monocytes Absolute 1.2 (*)    All other components within normal limits  TROPONIN I (HIGH SENSITIVITY) -  Abnormal; Notable for the following components:   Troponin I (High Sensitivity) 46 (*)    All other components within normal limits  LIPASE, BLOOD  I-STAT BETA HCG BLOOD, ED ([facility], [facility], [facility] ONLY)  TROPONIN I (HIGH SENSITIVITY)    EKG EKG Interpretation  Date/Time:  Saturday September 06 2020 12:48:37 EDT Ventricular Rate:  111 PR Interval:  123 QRS Duration: 72 QT Interval:  356 QTC Calculation: 484 R Axis:   11 Text Interpretation: Sinus tachycardia Biatrial enlargement Consider anterior infarct No significant change since last tracing Confirmed by Melene Plan [phone]) on 09/06/2020 3:05:38 PM  Radiology DG Chest Port 1 View  Result Date: 09/06/2020 CLINICAL DATA:  Chest pain EXAM: PORTABLE CHEST 1 VIEW COMPARISON:  None. FINDINGS: The lungs are clear and negative for focal airspace consolidation, pulmonary edema or suspicious pulmonary nodule. No pleural effusion or pneumothorax. Cardiac and mediastinal contours are within normal limits. No acute fracture or lytic or blastic osseous lesions. The visualized upper abdominal bowel gas pattern is unremarkable. IMPRESSION: Negative chest x-ray. Electronically Signed   By: Malachy Moan M.D.   On: 09/06/2020 13:46    Procedures Procedures   Medications Ordered in ED Medications  sodium chloride 0.9 % bolus 1,000 mL (0 mLs Intravenous Stopped 09/06/20 1423)  famotidine (PEPCID) IVPB 20 mg premix (0 mg Intravenous Stopped 09/06/20 1524)    ED Course  I have reviewed the triage vital signs and the nursing notes.  Pertinent labs & imaging results that were available during my care of the patient were reviewed by me and considered in my medical decision making (see chart for details).  Clinical Course as of 09/06/20 1654  Sat Sep 06, 2020  1319 EKG not crossing into  MUSE.  Sinus tachycardia normal intervals no acute ST-T changes. [MB]    Clinical Course User Index [MB] Terrilee Files, MD   MDM Rules/Calculators/A&P                         This patient complains of chest tightness abdominal tightness nausea lightheadedness; this involves an extensive number of treatment Options and is a complaint that carries with it a high risk of complications and Morbidity. The differential includes ACS, cholelithiasis, cholecystitis, peptic ulcer disease, hypovolemia, hypotension  I ordered, reviewed and interpreted labs, which included CBC with normal white count hemoglobin stable, chemistries fairly unremarkable other than mild AKI and elevated T bili isolated, troponins elevated will need to be trended, pregnancy test negative, lipase I ordered medication IV fluids IV Pepcid I ordered imaging studies which included chest x-ray and I independently    visualized and interpreted imaging which showed no acute findings Previous records obtained and reviewed in epic including recent ED visits for nausea vomiting abdominal pain  After the interventions stated above, I reevaluated the patient and found patient symptoms to be improving.  She satting 100% on room air.  Blood pressure is normalized.  Plan is to follow-up on delta troponin.  If not significantly rising and patient's symptoms controlled. patient's care signed out to oncoming provider Dr. Adela Lank   Final Clinical Impression(s) / ED Diagnoses Final diagnoses:  Nonspecific chest pain  Epigastric pain    Rx / DC Orders ED Discharge Orders     None        Terrilee Files, MD 09/06/20 1657

## 2020-09-06 NOTE — ED Triage Notes (Signed)
Pt BIB [facility] c/o chest pressure with N/V/weakness. Pt states chest pressure started at 9 this AM. Pt had EKG changes en route with EMS. Pt given 324 ASA and 1 SL NTG, pressure unrelieved by meds. Pt started lisinopril today.

## 2020-09-06 NOTE — ED Provider Notes (Signed)
Received the patient in signout from Dr. Charm Barges, briefly the patient is a 45 year old female with severe chest pain after vomiting.  Patient has been seen in the ED recently a couple times for retractable vomiting and thought to be possibly secondary to cannabinoid hyperemesis syndrome.  She is awaiting her second troponin as her first 1 was elevated though her symptoms were very atypical of cardiac disease.  Repeat troponin is slightly lower than the initial.  I reassessed the patient and she feels much better.  I did offer CT imaging which she is declining.  She will follow-up with her family doctor.  Given GI follow-up if needed.     Melene Plan, DO 09/06/20 1755

## 2020-09-06 NOTE — Discharge Instructions (Addendum)
Try pepcid or tagamet up to twice a day.  Try to avoid things that may make this worse, most commonly these are spicy foods tomato based products fatty foods chocolate and peppermint.  Alcohol and tobacco can also make this worse.  Return to the emergency department for sudden worsening pain fever or inability to eat or drink.  

## 2020-09-06 NOTE — ED Notes (Signed)
Pt verbalizes understanding of discharge instructions. Opportunity for questions and answers were provided. Pt discharged from the ED.   ?

## 2020-09-08 ENCOUNTER — Ambulatory Visit (INDEPENDENT_AMBULATORY_CARE_PROVIDER_SITE_OTHER): Payer: Self-pay | Admitting: Student

## 2020-09-08 ENCOUNTER — Other Ambulatory Visit: Payer: Self-pay

## 2020-09-08 ENCOUNTER — Encounter: Payer: Self-pay | Admitting: Student

## 2020-09-08 VITALS — BP 98/60 | HR 70 | Ht 65.0 in | Wt 157.5 lb

## 2020-09-08 DIAGNOSIS — I10 Essential (primary) hypertension: Secondary | ICD-10-CM

## 2020-09-08 NOTE — Assessment & Plan Note (Signed)
BP recheck today was 98/60. Started lisinopril on 7/15. Told patient to split 10 mg lisinopril and take only 5 mg a day and monitor home BPs as she had readings around 90s/80s at home and was feeling low. Checking BMP today as Cr on 7/16 was 1.45 from 0.85 on 7/13. PCP to decide dosage of lisinopril at future visit.

## 2020-09-08 NOTE — Patient Instructions (Signed)
It was great to see you! Thank you for allowing me to participate in your care!   I recommend that you always bring your medications to each appointment as this makes it easy to ensure we are on the correct medications and helps [place] not miss when refills are needed.  Our plans for today:  - We are going to check you kidney levels today and make sure it is alright - Please split your 10 mg tablets (take 5 mg) of lisinopril a day. - Please check your home blood pressures and contact us if there are any headaches, vision changes or chest pain.  We are checking some labs today, I will call you if they are abnormal will send you a MyChart message or a letter if they are normal.  If you do not hear about your labs in the next 2 weeks please let us know.  Take care and seek immediate care sooner if you develop any concerns.   Levin Erp, MD [facility]

## 2020-09-08 NOTE — Progress Notes (Signed)
    SUBJECTIVE:   CHIEF COMPLAINT / HPI: Hospital follow up  Hypertension -BP today 98/60 -Home blood pressures-- 90/86s, changed to lisinopril on 7/15 and feeling a little too low. Not taking HCTZ. No dizziness or falls. -Has slight headache since Saturday in temples that is relieved with prn tylenol but no vision changes -She is drinking lots of water and electrolyte drinks.  Chest pain - none since hospitalization - taking Pepcid for acid reflux and it works for her  Vomiting - none since hospitalization - patient stopped using marijuana - no nausea, vomiting, fevers, hematochezia, melena  PERTINENT  PMH / PSH: copd, htn, abnormal uterine bleeding, ex- cigarette smoker 1 year  OBJECTIVE:   Vitals:   09/08/20 1045  BP: 98/60  Pulse: 70  SpO2: 98%    BP 98/60   Pulse 70   Ht 5\' 5"  (1.651 m)   Wt 157 lb 8 oz (71.4 kg)   LMP 08/30/2020 (Approximate)   SpO2 98%   BMI 26.21 kg/m   Physical Exam HENT:     Head: Normocephalic and atraumatic.  Cardiovascular:     Rate and Rhythm: Normal rate and regular rhythm.     Heart sounds: No murmur heard.   No friction rub. No gallop.  Pulmonary:     Effort: Pulmonary effort is normal.     Breath sounds: Normal breath sounds. No wheezing, rhonchi or rales.  Abdominal:     General: Bowel sounds are normal. There is no distension.     Palpations: Abdomen is soft.     Tenderness: There is no abdominal tenderness. There is no guarding.  Musculoskeletal:     Right lower leg: No edema.     Left lower leg: No edema.  Skin:    General: Skin is warm and dry.  Neurological:     General: No focal deficit present.     Mental Status: She is alert.  Psychiatric:        Mood and Affect: Mood normal.    PHQ-9 0 No SI  ASSESSMENT/PLAN:   Primary hypertension BP recheck today was 98/60. Started lisinopril on 7/15. Told patient to split 10 mg lisinopril and take only 5 mg a day and monitor home BPs as she had readings around  90s/80s at home and was feeling low. Checking BMP today as Cr on 7/16 was 1.45 from 0.85 on 7/13. PCP to decide dosage of lisinopril at future visit.    Hx of Intractable Vomiting Patient quit using marijuana since these episodes and I encouraged her to continue her abstinence with this in order to prevent the vomiting episodes.  8/13, MD [facility]

## 2020-09-09 ENCOUNTER — Encounter: Payer: Self-pay | Admitting: Student

## 2020-09-09 LAB — BASIC METABOLIC PANEL
BUN/Creatinine Ratio: 13 (ref 9–23)
BUN: 12 mg/dL (ref 6–24)
CO2: 25 mmol/L (ref 20–29)
Calcium: 9.6 mg/dL (ref 8.7–10.2)
Chloride: 96 mmol/L (ref 96–106)
Creatinine, Ser: 0.89 mg/dL (ref 0.57–1.00)
Glucose: 83 mg/dL (ref 65–99)
Potassium: 3.4 mmol/L — ABNORMAL LOW (ref 3.5–5.2)
Sodium: 137 mmol/L (ref 134–144)
eGFR: 82 mL/min/{1.73_m2} (ref >59)

## 2020-10-19 ENCOUNTER — Other Ambulatory Visit: Payer: Self-pay | Admitting: Family Medicine

## 2020-10-20 ENCOUNTER — Other Ambulatory Visit: Payer: Self-pay | Admitting: Family Medicine

## 2020-10-20 DIAGNOSIS — I10 Essential (primary) hypertension: Secondary | ICD-10-CM

## 2020-10-20 MED ORDER — LISINOPRIL 5 MG PO TABS: 5.0000 mg | ORAL_TABLET | Freq: Every day | ORAL | 0 refills | Status: DC

## 2020-10-20 NOTE — Progress Notes (Signed)
Pt requested refill of lisinopril 10 mg, but had low bp at last visit. Will give 1 refill of lisinopril 5 mg, needs appt for future refills.  Shirlean Mylar, MD [facility], PGY-3

## 2020-11-29 ENCOUNTER — Other Ambulatory Visit: Payer: Self-pay | Admitting: Family Medicine

## 2020-11-29 DIAGNOSIS — I10 Essential (primary) hypertension: Secondary | ICD-10-CM

## 2020-12-10 ENCOUNTER — Ambulatory Visit: Payer: Medicaid Other

## 2020-12-16 ENCOUNTER — Encounter: Payer: Self-pay | Admitting: Podiatry

## 2020-12-16 ENCOUNTER — Other Ambulatory Visit: Payer: Self-pay

## 2020-12-16 ENCOUNTER — Ambulatory Visit (INDEPENDENT_AMBULATORY_CARE_PROVIDER_SITE_OTHER): Payer: Medicaid Other | Admitting: Podiatry

## 2020-12-16 DIAGNOSIS — Q828 Other specified congenital malformations of skin: Secondary | ICD-10-CM

## 2020-12-16 DIAGNOSIS — M79671 Pain in right foot: Secondary | ICD-10-CM | POA: Diagnosis not present

## 2020-12-16 DIAGNOSIS — M79672 Pain in left foot: Secondary | ICD-10-CM | POA: Diagnosis not present

## 2020-12-18 ENCOUNTER — Other Ambulatory Visit: Payer: Self-pay

## 2020-12-18 ENCOUNTER — Ambulatory Visit (INDEPENDENT_AMBULATORY_CARE_PROVIDER_SITE_OTHER): Payer: Medicaid Other

## 2020-12-18 DIAGNOSIS — Z23 Encounter for immunization: Secondary | ICD-10-CM | POA: Diagnosis not present

## 2020-12-18 NOTE — Progress Notes (Signed)
Patient presents to nurse clinic for flu vaccination. Administered in LD, site unremarkable, tolerated injection well.   Lucianne Smestad C Carsynn Bethune, RN   

## 2020-12-22 NOTE — Progress Notes (Signed)
  Subjective:  Patient ID: Christine Mueller, female    DOB: 1975-11-18,  MRN: [id number]  Christine Mueller presents to clinic today for painful porokeratotic lesions b/l feet.  Pain prevent comfortable ambulation. Aggravating factor is weightbearing with or without shoegear.     PCP is Shirlean Mylar, MD , and last visit was 10/20/2020.  Allergies  Allergen Reactions   Penicillins Swelling, Rash and Other (See Comments)    Reaction:  Facial swelling  Has patient had a PCN reaction causing immediate rash, facial/tongue/throat swelling, SOB or lightheadedness with hypotension: Yes Has patient had a PCN reaction causing severe rash involving mucus membranes or skin necrosis: No Has patient had a PCN reaction that required hospitalization No Has patient had a PCN reaction occurring within the last 10 years: No If all of the above answers are "NO", then may proceed with Cephalosporin use.     Review of Systems: Negative except as noted in the HPI. Objective:   Constitutional Christine Mueller is a pleasant 45 y.o. African American female, WD, WN in NAD. AAO x 3.   Vascular Capillary refill time to digits immediate b/l. Palpable DP pulse(s) b/l lower extremities Palpable PT pulse(s) b/l lower extremities Pedal hair sparse. Lower extremity skin temperature gradient within normal limits. No pain with calf compression b/l. No cyanosis or clubbing noted.  Neurologic Normal speech. Oriented to person, place, and time. Protective sensation intact 5/5 intact bilaterally with 10g monofilament b/l.  Dermatologic Pedal integument with normal turgor, texture and tone BLE. No open wounds b/l LE. No interdigital macerations b/l lower extremities. Hyperkeratotic lesion(s) R 2nd toe and plantar aspect b/l heel pads.  No erythema, no edema, no drainage, no fluctuance.  Orthopedic: Normal muscle strength 5/5 to all lower extremity muscle groups bilaterally. Hammertoe(s) noted to the L 4th toe.    Radiographs: None Assessment:   1. Porokeratosis   2. Pain in both feet    Plan:  Patient was evaluated and treated and all questions answered. Consent given for treatment as described below: -Medicaid ABN signed for this year. Patient consents for services of corn/callus paring  today. Copy has been placed in patient chart. -Painful porokeratotic lesion(s) left foot, right foot, L 4th toe, and posteromedial heel right foot pared and enucleated with sterile scalpel blade without incident. Total number of lesions debrided=3. -Iatrogenic laceration sustained during R 2nd toe.  Treated with Lumicain Hemostatic Solution and alcohol. Patient instructed to apply Nesoporin to R 2nd toe once daily for 7 days. -Patient/POA to call should there be question/concern in the interim.  Return in about 3 months (around 03/18/2021).  Freddie Breech, DPM

## 2021-03-25 ENCOUNTER — Ambulatory Visit (INDEPENDENT_AMBULATORY_CARE_PROVIDER_SITE_OTHER): Payer: Medicaid Other | Admitting: Podiatry

## 2021-03-25 DIAGNOSIS — Z91199 Patient's noncompliance with other medical treatment and regimen due to unspecified reason: Secondary | ICD-10-CM

## 2021-03-27 ENCOUNTER — Other Ambulatory Visit: Payer: Self-pay | Admitting: Family Medicine

## 2021-03-27 DIAGNOSIS — I10 Essential (primary) hypertension: Secondary | ICD-10-CM

## 2021-04-01 NOTE — Progress Notes (Signed)
   Complete physical exam  Patient: Christine Mueller   DOB: 12/12/1998   46 y.o. Female  MRN: [id number]  Subjective:    No chief complaint on file.   Christine Mueller is a 46 y.o. female who presents today for a complete physical exam. She reports consuming a {diet types:17450} diet. {types:19826} She generally feels {DESC; WELL/FAIRLY WELL/POORLY:18703}. She reports sleeping {DESC; WELL/FAIRLY WELL/POORLY:18703}. She {does/does not:200015} have additional problems to discuss today.    Most recent fall risk assessment:    08/19/2021   10:42 AM  Fall Risk   Falls in the past year? 0  Number falls in past yr: 0  Injury with Fall? 0  Risk for fall due to : No Fall Risks  Follow up Falls evaluation completed     Most recent depression screenings:    08/19/2021   10:42 AM 07/10/2020   10:46 AM  PHQ 2/9 Scores  PHQ - 2 Score 0 0  PHQ- 9 Score 5     {VISON DENTAL STD PSA (Optional):27386}  {History (Optional):23778}  Patient Care Team: Jessup, Joy, NP as PCP - General (Nurse Practitioner)   Outpatient Medications Prior to Visit  Medication Sig   fluticasone (FLONASE) 50 MCG/ACT nasal spray Place 2 sprays into both nostrils in the morning and at bedtime. After 7 days, reduce to once daily.   norgestimate-ethinyl estradiol (SPRINTEC 28) 0.25-35 MG-MCG tablet Take 1 tablet by mouth daily.   Nystatin POWD Apply liberally to affected area 2 times per day   spironolactone (ALDACTONE) 100 MG tablet Take 1 tablet (100 mg total) by mouth daily.   No facility-administered medications prior to visit.    ROS        Objective:     There were no vitals taken for this visit. {Vitals History (Optional):23777}  Physical Exam   No results found for any visits on 09/24/21. {Show previous labs (optional):23779}    Assessment & Plan:    Routine Health Maintenance and Physical Exam  Immunization History  Administered Date(s) Administered   DTaP 02/25/1999, 04/23/1999,  07/02/1999, 03/17/2000, 10/01/2003   Hepatitis A 07/28/2007, 08/02/2008   Hepatitis B 12/13/1998, 01/20/1999, 07/02/1999   HiB (PRP-OMP) 02/25/1999, 04/23/1999, 07/02/1999, 03/17/2000   IPV 02/25/1999, 04/23/1999, 12/21/1999, 10/01/2003   Influenza,inj,Quad PF,6+ Mos 11/02/2013   Influenza-Unspecified 02/02/2012   MMR 12/20/2000, 10/01/2003   Meningococcal Polysaccharide 08/02/2011   Pneumococcal Conjugate-13 03/17/2000   Pneumococcal-Unspecified 07/02/1999, 09/15/1999   Tdap 08/02/2011   Varicella 12/21/1999, 07/28/2007    Health Maintenance  Topic Date Due   HIV Screening  Never done   Hepatitis C Screening  Never done   INFLUENZA VACCINE  09/22/2021   PAP-Cervical Cytology Screening  09/24/2021 (Originally 12/12/2019)   PAP SMEAR-Modifier  09/24/2021 (Originally 12/12/2019)   TETANUS/TDAP  09/24/2021 (Originally 08/01/2021)   HPV VACCINES  Discontinued   COVID-19 Vaccine  Discontinued    Discussed health benefits of physical activity, and encouraged her to engage in regular exercise appropriate for her age and condition.  Problem List Items Addressed This Visit   None Visit Diagnoses     Annual physical exam    -  Primary   Cervical cancer screening       Need for Tdap vaccination          No follow-ups on file.     Joy Jessup, NP   

## 2021-04-20 ENCOUNTER — Ambulatory Visit (INDEPENDENT_AMBULATORY_CARE_PROVIDER_SITE_OTHER): Payer: Medicaid Other | Admitting: Family Medicine

## 2021-04-20 DIAGNOSIS — Z91199 Patient's noncompliance with other medical treatment and regimen due to unspecified reason: Secondary | ICD-10-CM

## 2021-04-20 NOTE — Progress Notes (Signed)
Patient has no-showed to multiple appointments in a 6-month period. Per our no-show policy, a letter has been routed to [facility] Admin to be mailed to patient regarding likely dismissal for repeat no show.   

## 2021-04-23 ENCOUNTER — Encounter: Payer: Medicaid Other | Admitting: Student

## 2021-04-23 NOTE — Patient Instructions (Incomplete)
It was great to see you! Thank you for allowing me to participate in your care! ? ?I recommend that you always bring your medications to each appointment as this makes it easy to ensure we are on the correct medications and helps [place] not miss when refills are needed. We saw you today to discuss your blood pressure.  ? ?Our plans for today:  ?-  ?-  ? ?We are checking some labs today, I will call you if they are abnormal will send you a MyChart message or a letter if they are normal.  If you do not hear about your labs in the next 2 weeks please let us know.*** ? ?Take care and seek immediate care sooner if you develop any concerns.  ? ?Dr. Bess Kinds, MD ?[facility] ? ?

## 2021-04-23 NOTE — Progress Notes (Deleted)
?  SUBJECTIVE:  ? ?CHIEF COMPLAINT / HPI:  ? ?BP check and physical ? ?BP ?BP Readings from Last 3 Encounters:  ?09/08/20 98/60  ?09/06/20 (!) 141/91  ?09/03/20 (!) 191/100  ?Today: ?Meds: Lisinopril 5 mg, HCTZ 25 ??? ?Symptoms: Headache, blurred vision, chest pain, SOB, light headedness  ? ? ?Health Maintenance: ?-PHQ9 ?-Colonoscopy ?-Hgb A1c - last A1c 2019 ?-Lipid panel - last lipid 2021 ?-Pap smear - Last Pap 2019, normal ?-Mammogram - BRCA hx? ?-Sexually Active: ?-Hep C screen ?-Hep B screen ? ? ?PERTINENT  PMH / PSH:  ? ?Past Medical History:  ?Diagnosis Date  ? Asthma   ? COPD (chronic obstructive pulmonary disease) ([place])   ? Essential hypertension 12/23/2015  ? Gestational diabetes   ? Hypertension   ? ? ?Past Surgical History:  ?Procedure Laterality Date  ? CESAREAN SECTION N/A 09/04/2015  ? Procedure: CESAREAN SECTION;  Surgeon: Florian Buff, MD;  Location: [facility];  Service: Obstetrics;  Laterality: N/A;  ? wisdom teeth removal    ? ? ?OBJECTIVE:  ?There were no vitals taken for this visit. ? ?General: NAD, pleasant, able to participate in exam ?Cardiac: RRR, no murmurs auscultated. ?Respiratory: CTAB, normal effort, no wheezes, rales or rhonchi ?Abdomen: soft, non-tender, non-distended, normoactive bowel sounds ?Extremities: warm and well perfused, no edema or cyanosis. ?Skin: warm and dry, no rashes noted ?Neuro: alert, no obvious focal deficits, speech normal ?Psych: Normal affect and mood ? ?ASSESSMENT/PLAN:  ?No problem-specific Assessment & Plan notes found for this encounter. ?  ?No orders of the defined types were placed in this encounter. ? ?No orders of the defined types were placed in this encounter. ? ?No follow-ups on file. ?$RemoveB'@SIGNNOTE'WExrRBQm$ @ ?{    This will disappear when note is signed, click to select method of visit    :1} ?

## 2021-04-24 ENCOUNTER — Ambulatory Visit (INDEPENDENT_AMBULATORY_CARE_PROVIDER_SITE_OTHER): Payer: Medicaid Other | Admitting: Family Medicine

## 2021-04-24 ENCOUNTER — Encounter: Payer: Self-pay | Admitting: Family Medicine

## 2021-04-24 ENCOUNTER — Other Ambulatory Visit: Payer: Self-pay

## 2021-04-24 VITALS — BP 150/84 | HR 72 | Wt 164.8 lb

## 2021-04-24 DIAGNOSIS — Z1211 Encounter for screening for malignant neoplasm of colon: Secondary | ICD-10-CM

## 2021-04-24 DIAGNOSIS — I10 Essential (primary) hypertension: Secondary | ICD-10-CM

## 2021-04-24 MED ORDER — LISINOPRIL 5 MG PO TABS: 5.0000 mg | ORAL_TABLET | Freq: Every day | ORAL | 2 refills | Status: DC

## 2021-04-24 NOTE — Progress Notes (Signed)
? ? ?  SUBJECTIVE:  ? ?Chief compliant/HPI: annual examination ? ?Christine Mueller is a 46 y.o. who presents today for an annual exam.  ? ?Patient has no specific concerns other than wanting to get her blood pressure medication refilled.  She notes that she was only given enough pills to try and get to the appointment but has been out of them for the last 2 to 3 days.  She otherwise was previously compliant with her medications. ? ?Patient is also wanting to know when she needs to start thinking about getting colonoscopy. ? ?Patient also reports that her mother has a history of breast cancer and is now in remission.  Patient has been getting her mammograms regularly and is going to be due for her next one soon. ? ?Updated history tabs and problem list.  ? ?OBJECTIVE:  ? ?BP (!) 150/84   Pulse 72   Wt 164 lb 12.8 oz (74.8 kg)   LMP 04/23/2021   SpO2 99%   BMI 27.42 kg/m?   ?Gen: well-appearing, NAD ?CV: RRR, no m/r/g appreciated, no peripheral edema ?Pulm: CTAB, no wheezes/crackles ?GI: soft, non-tender, non-distended ? ?ASSESSMENT/PLAN:  ? ?Primary hypertension ?Patient has been out of Lisinopril x3 days but required an appointment before refill.  BP 150/84.  Currently only on lisinopril 5 mg daily due to hypotensive episodes in the last year. ?- Refilled lisinopril 5 mg daily ?- Patient instructed to take blood pressure at least once daily and keep a log ?- If BP consistently > 130/80, patient to call clinic (may need to consider increasing the dose) ?  ? ?Annual Examination  ?See AVS for age appropriate recommendations.  ? ?PHQ score 0, reviewed and discussed. ?Blood pressure reviewed above goal ?Asked about intimate partner violence and patient reports: None.  ?The patient currently uses condoms for contraception. ?Advanced directives  ? ? ?Considered the following items based upon USPSTF recommendations: ?HIV testing: discussed ?Hepatitis C: discussed ?Hepatitis B: discussed ?Syphilis if at high risk:  discussed ?GC/CT not at high risk and not ordered. ?Lipid panel (nonfasting or fasting) discussed based upon AHA recommendations and not ordered.  Consider repeat every 4-6 years.  ?Reviewed risk factors for latent tuberculosis and not indicated ? ?Discussed family history, patient has a history of breast cancer. Mammogram is due soon, she knows how to make the appointment.  ?Cervical cancer screening: Due, but patient would like to delay to next visit and also complete STD testing at that time.  ?Immunizations: none given today  ? ?Follow up in 1 year or sooner if indicated. Plan for pap smear within the next month. ? ? ?Ambriel Gorelick, DO ?[facility]  ? ?

## 2021-04-24 NOTE — Patient Instructions (Signed)
It was so great seeing you today! Today we discussed the following: ? ?-We are going to get a lab to check your kidneys today. ? ?-I am placing a referral for a colonoscopy, please make sure to either call your insurance or double check with the GI office that the colonoscopy will covered by your insurance. ? ?-I have refilled your lisinopril, please make sure to check your blood pressures at least once a day over the next several days to make sure that you are not still having elevations.  Your goal blood pressure is around 130/80. ? ? ?Please make sure to bring any medications you take to your appointments. If you have any questions or concerns please call the office at [phone].  ? ?If any tests were collected today, I will notify you of results via MyChart, phone call, or letter. Please contact the office if you have not heard back about results within 2 weeks.  ? ?

## 2021-04-24 NOTE — Assessment & Plan Note (Signed)
Patient has been out of Lisinopril x3 days but required an appointment before refill.  BP 150/84.  Currently only on lisinopril 5 mg daily due to hypotensive episodes in the last year. ?- Refilled lisinopril 5 mg daily ?- Patient instructed to take blood pressure at least once daily and keep a log ?- If BP consistently > 130/80, patient to call clinic (may need to consider increasing the dose) ?

## 2021-04-25 LAB — BASIC METABOLIC PANEL
BUN/Creatinine Ratio: 11 (ref 9–23)
BUN: 9 mg/dL (ref 6–24)
CO2: 21 mmol/L (ref 20–29)
Calcium: 9.1 mg/dL (ref 8.7–10.2)
Chloride: 105 mmol/L (ref 96–106)
Creatinine, Ser: 0.84 mg/dL (ref 0.57–1.00)
Glucose: 92 mg/dL (ref 70–99)
Potassium: 4.7 mmol/L (ref 3.5–5.2)
Sodium: 141 mmol/L (ref 134–144)
eGFR: 87 mL/min/{1.73_m2} (ref >59)

## 2021-04-27 ENCOUNTER — Encounter: Payer: Self-pay | Admitting: Family Medicine

## 2021-04-29 ENCOUNTER — Emergency Department (HOSPITAL_COMMUNITY): Payer: Medicaid Other

## 2021-04-29 ENCOUNTER — Inpatient Hospital Stay (HOSPITAL_COMMUNITY)
Admission: EM | Admit: 2021-04-29 | Discharge: 2021-05-02 | DRG: 392 | Disposition: A | Discharge status: Home / Self Care | Payer: Medicaid Other | Attending: Internal Medicine | Admitting: Internal Medicine

## 2021-04-29 DIAGNOSIS — R1110 Vomiting, unspecified: Principal | ICD-10-CM

## 2021-04-29 DIAGNOSIS — I160 Hypertensive urgency: Secondary | ICD-10-CM | POA: Diagnosis present

## 2021-04-29 DIAGNOSIS — Z79899 Other long term (current) drug therapy: Secondary | ICD-10-CM

## 2021-04-29 DIAGNOSIS — I701 Atherosclerosis of renal artery: Secondary | ICD-10-CM | POA: Diagnosis present

## 2021-04-29 DIAGNOSIS — Z87891 Personal history of nicotine dependence: Secondary | ICD-10-CM

## 2021-04-29 DIAGNOSIS — Z20822 Contact with and (suspected) exposure to covid-19: Secondary | ICD-10-CM | POA: Diagnosis present

## 2021-04-29 DIAGNOSIS — J449 Chronic obstructive pulmonary disease, unspecified: Secondary | ICD-10-CM | POA: Diagnosis present

## 2021-04-29 DIAGNOSIS — Z880 Allergy status to penicillin: Secondary | ICD-10-CM

## 2021-04-29 DIAGNOSIS — R112 Nausea with vomiting, unspecified: Principal | ICD-10-CM | POA: Diagnosis present

## 2021-04-29 DIAGNOSIS — Z9114 Patient's other noncompliance with medication regimen: Secondary | ICD-10-CM

## 2021-04-29 DIAGNOSIS — I10 Essential (primary) hypertension: Secondary | ICD-10-CM | POA: Diagnosis present

## 2021-04-29 DIAGNOSIS — F12988 Cannabis use, unspecified with other cannabis-induced disorder: Secondary | ICD-10-CM | POA: Diagnosis present

## 2021-04-29 DIAGNOSIS — R519 Headache, unspecified: Secondary | ICD-10-CM | POA: Diagnosis present

## 2021-04-29 DIAGNOSIS — R001 Bradycardia, unspecified: Secondary | ICD-10-CM | POA: Diagnosis present

## 2021-04-29 DIAGNOSIS — Z8249 Family history of ischemic heart disease and other diseases of the circulatory system: Secondary | ICD-10-CM

## 2021-04-29 LAB — URINALYSIS, ROUTINE W REFLEX MICROSCOPIC
Bilirubin Urine: NEGATIVE
Glucose, UA: NEGATIVE mg/dL
Hgb urine dipstick: NEGATIVE
Ketones, ur: NEGATIVE mg/dL
Leukocytes,Ua: NEGATIVE
Nitrite: NEGATIVE
Protein, ur: NEGATIVE mg/dL
Specific Gravity, Urine: 1.016 (ref 1.005–1.030)
pH: 7 (ref 5.0–8.0)

## 2021-04-29 LAB — RAPID URINE DRUG SCREEN, HOSP PERFORMED
Amphetamines: NOT DETECTED
Barbiturates: NOT DETECTED
Benzodiazepines: NOT DETECTED
Cocaine: NOT DETECTED
Opiates: NOT DETECTED
Tetrahydrocannabinol: POSITIVE — AB

## 2021-04-29 LAB — CBC WITH DIFFERENTIAL/PLATELET
Abs Immature Granulocytes: 0.05 10*3/uL (ref 0.00–0.07)
Basophils Absolute: 0.1 10*3/uL (ref 0.0–0.1)
Basophils Relative: 1 %
Eosinophils Absolute: 0 10*3/uL (ref 0.0–0.5)
Eosinophils Relative: 0 %
HCT: 42.1 % (ref 36.0–46.0)
Hemoglobin: 13.5 g/dL (ref 12.0–15.0)
Immature Granulocytes: 0 %
Lymphocytes Relative: 10 %
Lymphs Abs: 1.4 10*3/uL (ref 0.7–4.0)
MCH: 30.5 pg (ref 26.0–34.0)
MCHC: 32.1 g/dL (ref 30.0–36.0)
MCV: 95.2 fL (ref 80.0–100.0)
Monocytes Absolute: 0.5 10*3/uL (ref 0.1–1.0)
Monocytes Relative: 4 %
Neutro Abs: 11.9 10*3/uL — ABNORMAL HIGH (ref 1.7–7.7)
Neutrophils Relative %: 85 %
Platelets: 328 10*3/uL (ref 150–400)
RBC: 4.42 MIL/uL (ref 3.87–5.11)
RDW: 12.1 % (ref 11.5–15.5)
WBC: 14 10*3/uL — ABNORMAL HIGH (ref 4.0–10.5)
nRBC: 0 % (ref 0.0–0.2)

## 2021-04-29 LAB — COMPREHENSIVE METABOLIC PANEL
ALT: 19 U/L (ref 0–44)
AST: 26 U/L (ref 15–41)
Albumin: 4.9 g/dL (ref 3.5–5.0)
Alkaline Phosphatase: 101 U/L (ref 38–126)
Anion gap: 14 (ref 5–15)
BUN: 11 mg/dL (ref 6–20)
CO2: 22 mmol/L (ref 22–32)
Calcium: 9.5 mg/dL (ref 8.9–10.3)
Chloride: 104 mmol/L (ref 98–111)
Creatinine, Ser: 0.74 mg/dL (ref 0.44–1.00)
GFR, Estimated: >60 mL/min (ref >60)
Glucose, Bld: 191 mg/dL — ABNORMAL HIGH (ref 70–99)
Potassium: 3.5 mmol/L (ref 3.5–5.1)
Sodium: 140 mmol/L (ref 135–145)
Total Bilirubin: 0.6 mg/dL (ref 0.3–1.2)
Total Protein: 8.4 g/dL — ABNORMAL HIGH (ref 6.5–8.1)

## 2021-04-29 LAB — I-STAT BETA HCG BLOOD, ED (MC, WL, AP ONLY): I-stat hCG, quantitative: <5 m[IU]/mL (ref <5)

## 2021-04-29 LAB — LIPASE, BLOOD: Lipase: 26 U/L (ref 11–51)

## 2021-04-29 MED ORDER — DROPERIDOL 2.5 MG/ML IJ SOLN
1.2500 mg | Freq: Once | INTRAMUSCULAR | Status: AC
  Administered 2021-04-29: 1.25 mg via INTRAVENOUS
  Filled 2021-04-29: qty 2

## 2021-04-29 MED ORDER — NITROGLYCERIN 2 % TD OINT
1.0000 [in_us] | TOPICAL_OINTMENT | Freq: Once | TRANSDERMAL | Status: AC
  Administered 2021-04-29: 1 [in_us] via TOPICAL
  Filled 2021-04-29 (×2): qty 1

## 2021-04-29 MED ORDER — ONDANSETRON HCL 4 MG/2ML IJ SOLN
INTRAMUSCULAR | Status: AC
  Filled 2021-04-29: qty 2

## 2021-04-29 MED ORDER — NITROGLYCERIN IN D5W 200-5 MCG/ML-% IV SOLN
0.0000 ug/min | INTRAVENOUS | Status: DC
  Administered 2021-04-29: 5 ug/min via INTRAVENOUS
  Filled 2021-04-29: qty 250

## 2021-04-29 MED ORDER — LISINOPRIL 10 MG PO TABS
10.0000 mg | ORAL_TABLET | Freq: Once | ORAL | Status: AC
Start: 2021-04-29 — End: 2021-04-29
  Administered 2021-04-29: 10 mg via ORAL
  Filled 2021-04-29: qty 1

## 2021-04-29 MED ORDER — LACTATED RINGERS IV BOLUS
1000.0000 mL | Freq: Once | INTRAVENOUS | Status: AC
  Administered 2021-04-29: 1000 mL via INTRAVENOUS

## 2021-04-29 NOTE — ED Triage Notes (Signed)
Pt states she has abdominal pain that started this morning. Pt also reports vomiting today. ?

## 2021-04-29 NOTE — ED Provider Notes (Signed)
[facility]-EMERGENCY DEPT Provider Note   CSN: [id number] Arrival date & time: 04/29/21  1656     History Chief Complaint  Patient presents with   Abdominal Pain    Nicanor BakeDalila L Sharlet SalinaBenjamin is a 46 y.o. female with h/o HTN presents to the ED for eval of nausea and vomiting (> 20 episodes) since around 1500 today. The patient reports that she had Popeye's around 30 minutes before and then started vomiting. She reports diffuse abdominal pain only when vomiting. Reports 1 episode of loose stools. She denies any headache, chest pain, SOB, dysuria, hematuria, coffee ground emesis, hematemesis, fever, unilateral weakness, or recent illness. The patient reports that she took her Lisinopril this morning. Daily marijuana smoker.    Abdominal Pain Associated symptoms: diarrhea, nausea and vomiting   Associated symptoms: no chest pain, no chills, no dysuria, no fever, no hematuria and no shortness of breath       Home Medications Prior to Admission medications   Medication Sig Start Date End Date Taking? Authorizing Provider  acetaminophen (TYLENOL) 500 MG tablet Take 500 mg by mouth every 6 (six) hours as needed for moderate pain.    [provider]  lisinopril (ZESTRIL) 5 MG tablet Take 1 tablet (5 mg total) by mouth daily. 04/24/21   Lilland, Alana, DO  Prenatal Vit-Fe Fumarate-FA (PRENATAL VITAMINS) 28-0.8 MG TABS Take 1 tablet by mouth daily. Patient not taking: Reported on 09/06/2020 03/30/19   Moses MannersHensel, William A, MD  albuterol (PROVENTIL HFA;VENTOLIN HFA) 108 (90 Base) MCG/ACT inhaler Inhale 2 puffs into the lungs every 4 (four) hours as needed for wheezing or shortness of breath. Patient not taking: Reported on 04/13/2018 03/29/17 01/21/19  Casey BurkittFitzgerald, Hillary Moen, MD      Allergies    Penicillins    Review of Systems   Review of Systems  Constitutional:  Negative for chills and fever.  Respiratory:  Negative for shortness of breath.   Cardiovascular:  Negative for  chest pain.  Gastrointestinal:  Positive for abdominal pain, diarrhea, nausea and vomiting.  Genitourinary:  Negative for dysuria and hematuria.  Neurological:  Negative for weakness, light-headedness and headaches.   Physical Exam Updated Vital Signs BP (!) 234/111 (BP Location: Left Arm)    Pulse (!) 54    Temp 97.7 F (36.5 C) (Oral)    Resp 18    Ht 5\' 7"  (1.702 m)    Wt 72.6 kg    LMP 04/23/2021    SpO2 98%    BMI 25.06 kg/m  Physical Exam Vitals and nursing note reviewed.  Constitutional:      Appearance: She is ill-appearing. She is not diaphoretic.     Comments: Actively dry-heaving/retching   HENT:     Mouth/Throat:     Mouth: Mucous membranes are dry.  Cardiovascular:     Rate and Rhythm: Bradycardia present.  Pulmonary:     Effort: Pulmonary effort is normal. No respiratory distress.     Breath sounds: Normal breath sounds.  Abdominal:     General: Bowel sounds are normal. There is no distension.     Palpations: Abdomen is soft.     Tenderness: There is no abdominal tenderness. There is no right CVA tenderness, left CVA tenderness, guarding or rebound. Negative signs include Murphy's sign.     Comments: No overlying skin changes noted. NBS. Soft. Non-tender to palpation. No guarding or rebound.   Skin:    General: Skin is dry.  Neurological:     General:  No focal deficit present.     Mental Status: She is alert.     Cranial Nerves: No cranial nerve deficit.     Comments: Normal finger to nose. Cranial nerves II-XII intact. Sensation intact. Moving all extremities spontaneously.     ED Results / Procedures / Treatments   Labs (all labs ordered are listed, but only abnormal results are displayed) Labs Reviewed  CBC WITH DIFFERENTIAL/PLATELET - Abnormal; Notable for the following components:      Result Value   WBC 14.0 (*)    Neutro Abs 11.9 (*)    All other components within normal limits  COMPREHENSIVE METABOLIC PANEL - Abnormal; Notable for the following  components:   Glucose, Bld 191 (*)    Total Protein 8.4 (*)    All other components within normal limits  URINALYSIS, ROUTINE W REFLEX MICROSCOPIC - Abnormal; Notable for the following components:   APPearance CLOUDY (*)    All other components within normal limits  RAPID URINE DRUG SCREEN, HOSP PERFORMED - Abnormal; Notable for the following components:   Tetrahydrocannabinol POSITIVE (*)    All other components within normal limits  LIPASE, BLOOD  I-STAT BETA HCG BLOOD, ED ([facility], [facility], [facility] ONLY)  TROPONIN I (HIGH SENSITIVITY)    EKG EKG Interpretation  Date/Time:  Wednesday April 29 2021 20:04:58 EST Ventricular Rate:  50 PR Interval:  164 QRS Duration: 90 QT Interval:  477 QTC Calculation: 435 R Axis:   67 Text Interpretation: Sinus rhythm Consider left ventricular hypertrophy Confirmed by Gloris Manchester ([id number]) on 04/29/2021 9:20:39 PM  Radiology CT Head Wo Contrast  Result Date: 04/29/2021 CLINICAL DATA:  Chronic headaches EXAM: CT HEAD WITHOUT CONTRAST TECHNIQUE: Contiguous axial images were obtained from the base of the skull through the vertex without intravenous contrast. RADIATION DOSE REDUCTION: This exam was performed according to the departmental dose-optimization program which includes automated exposure control, adjustment of the mA and/or kV according to patient size and/or use of iterative reconstruction technique. COMPARISON:  None. FINDINGS: Brain: No evidence of acute infarction, hemorrhage, hydrocephalus, extra-axial collection or mass lesion/mass effect. Vascular: No hyperdense vessel or unexpected calcification. Skull: Normal. Negative for fracture or focal lesion. Sinuses/Orbits: No acute finding. Other: None. IMPRESSION: No acute intracranial abnormality noted. Electronically Signed   By: Alcide Clever M.D.   On: 04/29/2021 22:25    Procedures Procedures   Medications Ordered in ED Medications  nitroGLYCERIN (NITROGLYN) 2 % ointment 1 inch (has no administration in  time range)  lisinopril (ZESTRIL) tablet 10 mg (has no administration in time range)  droperidol (INAPSINE) 2.5 MG/ML injection 1.25 mg (1.25 mg Intravenous Given 04/29/21 2124)  lactated ringers bolus 1,000 mL (1,000 mLs Intravenous New Bag/Given 04/29/21 2125)    ED Course/ Medical Decision Making/ A&P                           Medical Decision Making Amount and/or Complexity of Data Reviewed Radiology: ordered.  Risk Prescription drug management.   46 y/o F presents to the ED for evaluation of nausea and vomiting. Differential diagnosis includes but is not limited to cannaboid induced hyperemesis syndrome, gastroeneteritis, viral illness, SBO, cholecystitis, cholelithiasis, choledocholithiasis, cholangitis, ICH, SAH.  Vital signs show significant hypertension at 227/111, borderline bradycardia, afebrile, satting well on room air without any increased work of breathing. Physical exam shows a ill-appearing female that is actively retching/dry heaving.  Cranial nerves II through XII intact.  PERRLA.  Finger-nose normal.  Oriented x3.  She is a nontender, soft abdomen.  Additional information obtained from mother at bedside and chart review.  The patient was seen for initial like this previously around July 2022 where she was hypertensive with nausea and vomiting.  EKG obtained to rule out prolongation of the QT.  Patient was given droperidol and 1 L of LR.  After discussion with my attending, we ordered Nitropaste and double dose of her at home lisinopril (10 mg). Additionally, he ordered a head CT given the high blood pressure.   I independently reviewed and interpreted the patient's labs and imaging.  Urinalysis shows cloudy urine however is not concentrated.  UDS positive for THC.  CMP shows mildly elevated glucose at 191 and mildly increased protein 8.4.  Normal GFR.  Normal anion gap.  CBC shows elevated white blood cell count of 14.0 with a left shift, likely due to stress from her vomiting.   No signs of anemia.  Lipase normal.  I-STAT beta-hCG negative.  I discussed this case with my attending physician who cosigned this note including patient's presenting symptoms, physical exam, and planned diagnostics and interventions. Attending physician stated agreement with plan or made changes to plan which were implemented.   Attending physician assessed patient at bedside.  Care of Daneil Dan transferred to PA Rhea Bleacher at the end of my shift as the patient will require reassessment once labs/imaging have resulted. Patient presentation, ED course, and plan of care discussed with review of all pertinent labs and imaging. Please see his/her note for further details regarding further ED course and disposition. Plan at time of handoff is Follow up on labs and imaging. Further evaluate the patient's HTN. PO challenge. This may be altered or completely changed at the discretion of the oncoming team pending results of further workup.  Final Clinical Impression(s) / ED Diagnoses Final diagnoses:  None    Rx / DC Orders ED Discharge Orders     None         Achille Rich, Cordelia Poche 04/29/21 2321    Gloris Manchester, MD 05/02/21 [phone]

## 2021-04-29 NOTE — ED Provider Triage Note (Signed)
Emergency Medicine Provider Triage Evaluation Note ? ?Christine Mueller , a 46 y.o. female  was evaluated in triage.  Pt complains of nausea, vomiting, generalized abdominal pain greatest suprapubic region.  She denies any vaginal discharge, pelvic pain, no concerns for STD.  No dysuria, hematuria.  Multiple episodes of NBNB emesis today.  Does occasionally drink EtOH however not daily, does use marijuana.  Recently started on blood pressure medication, first dose this morning.  Significantly hypertensive here.  No headache, blurred vision, chest pain, numbness or weakness ? ?Review of Systems  ?Positive: Nausea, vomiting, abdominal pain ?Negative: Headache, blurred vision, numbness, weakness, chest pain, shortness of breath ? ?Physical Exam  ?BP (!) 227/111 (BP Location: Left Arm)   Pulse 62   Temp 98.3 ?F (36.8 ?C) (Oral)   Resp 18   Ht 5\' 5"  (1.651 m)   Wt 72.6 kg   LMP 04/23/2021   SpO2 100%   BMI 26.63 kg/m?  ?Gen:   Awake, no distress   ?Resp:  Normal effort  ?MSK:   Moves extremities without difficulty  ?ABD:  Diffuse abd pain ?Other:   ? ?Medical Decision Making  ?Medically screening exam initiated at 5:20 PM.  Appropriate orders placed.  06/23/2021 was informed that the remainder of the evaluation will be completed by another provider, this initial triage assessment does not replace that evaluation, and the importance of remaining in the ED until their evaluation is complete. ? ?Abd pain, N/V ?  ?Ricardo Kayes A, PA-C ?04/29/21 1721 ? ?

## 2021-04-29 NOTE — ED Notes (Signed)
Attempted lab draw x 1 but unsuccessful. Pt. Refused a second lab draw. RN made aware. ?

## 2021-04-30 ENCOUNTER — Encounter (HOSPITAL_COMMUNITY): Payer: Self-pay | Admitting: Family Medicine

## 2021-04-30 ENCOUNTER — Other Ambulatory Visit: Payer: Self-pay

## 2021-04-30 DIAGNOSIS — R112 Nausea with vomiting, unspecified: Secondary | ICD-10-CM | POA: Diagnosis not present

## 2021-04-30 DIAGNOSIS — F1290 Cannabis use, unspecified, uncomplicated: Secondary | ICD-10-CM | POA: Diagnosis not present

## 2021-04-30 DIAGNOSIS — I160 Hypertensive urgency: Secondary | ICD-10-CM

## 2021-04-30 LAB — BASIC METABOLIC PANEL
Anion gap: 16 — ABNORMAL HIGH (ref 5–15)
BUN: 8 mg/dL (ref 6–20)
CO2: 17 mmol/L — ABNORMAL LOW (ref 22–32)
Calcium: 8.9 mg/dL (ref 8.9–10.3)
Chloride: 102 mmol/L (ref 98–111)
Creatinine, Ser: 0.69 mg/dL (ref 0.44–1.00)
GFR, Estimated: >60 mL/min (ref >60)
Glucose, Bld: 221 mg/dL — ABNORMAL HIGH (ref 70–99)
Potassium: 3.4 mmol/L — ABNORMAL LOW (ref 3.5–5.1)
Sodium: 135 mmol/L (ref 135–145)

## 2021-04-30 LAB — CBC
HCT: 41.4 % (ref 36.0–46.0)
Hemoglobin: 13.7 g/dL (ref 12.0–15.0)
MCH: 30.9 pg (ref 26.0–34.0)
MCHC: 33.1 g/dL (ref 30.0–36.0)
MCV: 93.2 fL (ref 80.0–100.0)
Platelets: 295 10*3/uL (ref 150–400)
RBC: 4.44 MIL/uL (ref 3.87–5.11)
RDW: 12.3 % (ref 11.5–15.5)
WBC: 15.2 10*3/uL — ABNORMAL HIGH (ref 4.0–10.5)
nRBC: 0 % (ref 0.0–0.2)

## 2021-04-30 LAB — RESP PANEL BY RT-PCR (FLU A&B, COVID) ARPGX2
Influenza A by PCR: NEGATIVE
Influenza B by PCR: NEGATIVE
SARS Coronavirus 2 by RT PCR: NEGATIVE

## 2021-04-30 LAB — HIV ANTIBODY (ROUTINE TESTING W REFLEX): HIV Screen 4th Generation wRfx: NONREACTIVE

## 2021-04-30 LAB — TSH: TSH: 0.709 u[IU]/mL (ref 0.350–4.500)

## 2021-04-30 LAB — TROPONIN I (HIGH SENSITIVITY)
Troponin I (High Sensitivity): 4 ng/L (ref <18)
Troponin I (High Sensitivity): 6 ng/L (ref <18)

## 2021-04-30 MED ORDER — METOPROLOL TARTRATE 25 MG PO TABS: 12.5000 mg | ORAL_TABLET | Freq: Two times a day (BID) | ORAL | Status: DC

## 2021-04-30 MED ORDER — HYDRALAZINE HCL 20 MG/ML IJ SOLN
10.0000 mg | Freq: Once | INTRAMUSCULAR | Status: AC
  Administered 2021-04-30: 03:00:00 10 mg via INTRAVENOUS
  Filled 2021-04-30: qty 1

## 2021-04-30 MED ORDER — ONDANSETRON HCL 4 MG PO TABS
4.0000 mg | ORAL_TABLET | Freq: Four times a day (QID) | ORAL | Status: DC | PRN
  Administered 2021-05-02: 4 mg via ORAL
  Filled 2021-04-30: qty 1

## 2021-04-30 MED ORDER — DILTIAZEM HCL 25 MG/5ML IV SOLN
10.0000 mg | Freq: Once | INTRAVENOUS | Status: DC
Start: 2021-04-30 — End: 2021-04-30
  Filled 2021-04-30: qty 5

## 2021-04-30 MED ORDER — AMLODIPINE BESYLATE 10 MG PO TABS
10.0000 mg | ORAL_TABLET | Freq: Every day | ORAL | Status: DC
  Administered 2021-04-30 – 2021-05-02 (×3): 10 mg via ORAL
  Filled 2021-04-30 (×2): qty 1
  Filled 2021-04-30: qty 2

## 2021-04-30 MED ORDER — SODIUM BICARBONATE 650 MG PO TABS
1300.0000 mg | ORAL_TABLET | Freq: Two times a day (BID) | ORAL | Status: DC
  Administered 2021-04-30 – 2021-05-02 (×5): 1300 mg via ORAL
  Filled 2021-04-30 (×6): qty 2

## 2021-04-30 MED ORDER — CAPSAICIN 0.075 % EX CREA
TOPICAL_CREAM | Freq: Once | CUTANEOUS | Status: AC
  Filled 2021-04-30: qty 57

## 2021-04-30 MED ORDER — POTASSIUM CHLORIDE CRYS ER 20 MEQ PO TBCR
40.0000 meq | EXTENDED_RELEASE_TABLET | Freq: Once | ORAL | Status: AC
  Administered 2021-04-30: 16:00:00 40 meq via ORAL
  Filled 2021-04-30: qty 2

## 2021-04-30 MED ORDER — DIAZEPAM 5 MG/ML IJ SOLN
5.0000 mg | Freq: Once | INTRAMUSCULAR | Status: AC
  Administered 2021-04-30: 02:00:00 5 mg via INTRAVENOUS
  Filled 2021-04-30: qty 2

## 2021-04-30 MED ORDER — ACETAMINOPHEN 650 MG RE SUPP: 650.0000 mg | Freq: Four times a day (QID) | RECTAL | Status: DC | PRN

## 2021-04-30 MED ORDER — ACETAMINOPHEN 325 MG PO TABS
650.0000 mg | ORAL_TABLET | Freq: Four times a day (QID) | ORAL | Status: DC | PRN
  Administered 2021-04-30 – 2021-05-02 (×4): 650 mg via ORAL
  Filled 2021-04-30 (×5): qty 2

## 2021-04-30 MED ORDER — METOCLOPRAMIDE HCL 5 MG/ML IJ SOLN
10.0000 mg | Freq: Once | INTRAMUSCULAR | Status: AC
  Administered 2021-04-30: 03:00:00 10 mg via INTRAVENOUS
  Filled 2021-04-30: qty 2

## 2021-04-30 MED ORDER — LACTATED RINGERS IV SOLN: INTRAVENOUS | Status: DC

## 2021-04-30 MED ORDER — ONDANSETRON HCL 4 MG/2ML IJ SOLN: 4.0000 mg | Freq: Four times a day (QID) | INTRAMUSCULAR | Status: DC | PRN

## 2021-04-30 MED ORDER — METOPROLOL TARTRATE 25 MG PO TABS
25.0000 mg | ORAL_TABLET | Freq: Two times a day (BID) | ORAL | Status: DC
  Administered 2021-04-30 (×2): 25 mg via ORAL
  Filled 2021-04-30 (×2): qty 1

## 2021-04-30 MED ORDER — HYDRALAZINE HCL 20 MG/ML IJ SOLN
10.0000 mg | Freq: Three times a day (TID) | INTRAMUSCULAR | Status: DC | PRN
  Administered 2021-04-30 (×2): 10 mg via INTRAVENOUS
  Filled 2021-04-30 (×3): qty 1

## 2021-04-30 NOTE — Assessment & Plan Note (Signed)
duoneb as needed ?

## 2021-04-30 NOTE — ED Notes (Signed)
Provider informed, Amion, of elevated BP, and trend ?

## 2021-04-30 NOTE — Progress Notes (Signed)
Christine Mueller is a 46 y.o. female with medical history significant of COPD, HTN who presents for evaluation of repeated bouts of vomiting.  Reportedly she was driving to work when she began to have vomiting and felt nauseous so she went home.  She continued to vomit multiple times that she was brought to the emergency room.  She had similar presentation a year ago for intractable vomiting.  Noted in the emergency room to have elevated blood pressure in the 2032 238/96-122 range.  She was initially given a p.o. dose of lisinopril which she vomited up a few minutes later.  Nitropaste was placed but blood pressure did not improve and patient developed a headache.  Patient is wiped off the paste on the sheet on the gurney.  She has been given Ativan, droperidol and Reglan in the emergency room and continues to have vomiting.  She has not been able to tolerate p.o. intake. ?She admits to smoking marijuana daily.  She drinks alcohol occasionally with last use last night but states it was no more than 1-2 drinks.  Never had pancreatitis.  ?Hospital service been asked to evaluate and manage patient for further treatment ?  ?Review of Systems: As mentioned in the history of present illness. All other systems reviewed and are negative. ? ?04/30/2021: Patient was seen and examined at her bedside.  Her mother was present in the room.  She denies any headache.  Her blood pressure is elevated.  She states her nausea is improved.  Started on Norvasc and Lopressor, on IV antihypertensives with parameters for systolic SBP greater than 200 and diastolic greater than 100. ? ?IV fluids stopped and started on full liquid diet.  Started on oral potassium and bicarb replacement. ? ?Please refer to H&P dictated by my partner Dr. Rachael Darby on 04/30/2021 for further details of the assessment and plan. ?

## 2021-04-30 NOTE — Progress Notes (Signed)
YELLOW MEWS ALERT ? 04/30/21 1950  ?Vitals  ?Temp 98.6 ?F (37 ?C)  ?Temp Source Oral  ?BP (!) 202/118  ?MAP (mmHg) 144  ?BP Location Right Arm  ?BP Method Automatic  ?Patient Position (if appropriate) Sitting  ?Pulse Rate 88  ?Pulse Rate Source Monitor  ?Resp 19  ?MEWS COLOR  ?MEWS Score Color Yellow  ?Oxygen Therapy  ?SpO2 100 %  ?O2 Device Room Air  ? ?This RN assessed the patient. Upon assessment the patient is lying down resting in bed. The patient reports no additional symptoms. Assessment findings WDL.  ? ?2039: Hydralazine administered.  ? ?[place], NP notified.  ? ?Next vital check per yellow MEWS guidelines will be 2150.  ? ? ?

## 2021-04-30 NOTE — Assessment & Plan Note (Signed)
Given dose of hydralazine IV now. Monitor BP.  ?Was given po lisinopril earlier in the ER but vomited within a few minutes of the dose and BP never lowered.  ?NTG paste placed in ER but wiped off by patient. Pt developed headache after it was placed ?

## 2021-04-30 NOTE — H&P (Signed)
?History and Physical  ? ? ?Patient: Christine Mueller QGB:[id number] DOB: 09/21/1975 ?DOA: 04/29/2021 ?DOS: the patient was seen and examined on 04/30/2021 ?PCP: Christine Mylar, MD  ?Patient coming from: Home ? ?Chief Complaint:  ?Chief Complaint  ?Patient presents with  ? Abdominal Pain  ? ?HPI: Christine Mueller is a 46 y.o. female with medical history significant of COPD, HTN who presents for evaluation of repeated bouts of vomiting.  Reportedly she was driving to work when she began to have vomiting and felt nauseous so she went home.  She continued to vomit multiple times that she was brought to the emergency room.  She had similar presentation a year ago for intractable vomiting.  Noted in the emergency room to have elevated blood pressure in the 2032 238/96-122 range.  She was initially given a p.o. dose of lisinopril which she vomited up a few minutes later.  Nitropaste was placed but blood pressure did not improve and patient developed a headache.  Patient is wiped off the paste on the sheet on the gurney.  She has been given Ativan, droperidol and Reglan in the emergency room and continues to have vomiting.  She has not been able to tolerate p.o. intake. ?She admits to smoking marijuana daily.  She drinks alcohol occasionally with last use last night but states it was no more than 1-2 drinks.  Never had pancreatitis.  ?Hospital service been asked to evaluate and manage patient for further treatment ? ?Review of Systems: As mentioned in the history of present illness. All other systems reviewed and are negative. ?Past Medical History:  ?Diagnosis Date  ? Asthma   ? COPD (chronic obstructive pulmonary disease) (HCC)   ? Essential hypertension 12/23/2015  ? Gestational diabetes   ? Hypertension   ? ?Past Surgical History:  ?Procedure Laterality Date  ? CESAREAN SECTION N/A 09/04/2015  ? Procedure: CESAREAN SECTION;  Surgeon: Lazaro Arms, MD;  Location: [facility] BIRTHING SUITES;  Service: Obstetrics;  Laterality:  N/A;  ? wisdom teeth removal    ? ?Social History:  reports that she quit smoking about 2 years ago. Her smoking use included cigarettes. She has a 45.00 pack-year smoking history. She has never used smokeless tobacco. She reports current drug use. Drug: Marijuana. She reports that she does not drink alcohol. ? ?Allergies  ?Allergen Reactions  ? Penicillins Swelling, Rash and Other (See Comments)  ?  Reaction:  Facial swelling  ?Has patient had a PCN reaction causing immediate rash, facial/tongue/throat swelling, SOB or lightheadedness with hypotension: Yes ?Has patient had a PCN reaction causing severe rash involving mucus membranes or skin necrosis: No ?Has patient had a PCN reaction that required hospitalization No ?Has patient had a PCN reaction occurring within the last 10 years: No ?If all of the above answers are "NO", then may proceed with Cephalosporin use. ?  ? ? ?Family History  ?Problem Relation Age of Onset  ? Diabetes Mother   ? Hypertension Mother   ? Breast cancer Neg Hx   ? ? ?Prior to Admission medications   ?Medication Sig Start Date End Date Taking? Authorizing Provider  ?acetaminophen (TYLENOL) 500 MG tablet Take 500 mg by mouth every 6 (six) hours as needed for moderate pain.    [provider]  ?lisinopril (ZESTRIL) 5 MG tablet Take 1 tablet (5 mg total) by mouth daily. 04/24/21   Evelena Leyden, DO  ?Prenatal Vit-Fe Fumarate-FA (PRENATAL VITAMINS) 28-0.8 MG TABS Take 1 tablet by mouth daily. ?Patient not taking:  Reported on 09/06/2020 03/30/19   Moses Manners, MD  ?albuterol (PROVENTIL HFA;VENTOLIN HFA) 108 (90 Base) MCG/ACT inhaler Inhale 2 puffs into the lungs every 4 (four) hours as needed for wheezing or shortness of breath. ?Patient not taking: Reported on 04/13/2018 03/29/17 01/21/19  Casey Burkitt, MD  ? ? ?Physical Exam: ?Vitals:  ? 04/30/21 0141 04/30/21 0230 04/30/21 0300 04/30/21 0315  ?BP: (!) 209/106 (!) 216/101 (!) 203/108 (!) 216/96  ?Pulse: 63 66 71 69  ?Resp:  16 16    ?Temp:      ?TempSrc:      ?SpO2: 97% 98% 100% 98%  ?Weight:      ?Height:      ? ?General: WDWN, lethargic.  Alert and oriented x3. Opens eyes to voice but gives limited verbal responses.  ?Eyes: EOMI, PERRL, conjunctivae normal.  Sclera nonicteric ?HENT:  [place]/AT, external ears normal.  Nares patent without epistasis.  Mucous membranes are dry ?Neck: Soft, normal range of motion, supple, no masses, Trachea midline ?Respiratory: clear to auscultation bilaterally, no wheezing, no crackles. Normal respiratory effort. No accessory muscle use.  ?Cardiovascular: Regular rate and rhythm, no murmurs / rubs / gallops. No extremity edema. 2+ pedal pulses. ?Abdomen: Soft, no tenderness, nondistended, no rebound or guarding.  No masses palpated. Bowel sounds normoactive ?Musculoskeletal: FROM. No cyanosis. Normal muscle tone.  ?Skin: Warm, dry, intact no rashes ?Neurologic: CN 2-12 grossly intact. Normal speech. Sensation intact to touch and withdraws from painful stimuli. Grip strength equal ?Psychiatric: Normal mood.  ? ?Data Reviewed: ?Lab Work: WBC 14,000 hemoglobin 13.5 hematocrit 42.1 platelets 328,000.  Leukocytosis left shift from vomiting.  Troponin 6 sodium 140 potassium 3.5 chloride 104 bicarb 22 creatinine 0.74 BUN 11 glucose 191 alkaline phosphatase 101 AST 26 ALT 19 lipase 26 albumin 4.9 bilirubin 0.6 ?hCG less than 5.  Urinalysis negative.  UDS positive for THC ?COVID pending ? ?CT head negative for acute intracranial pathology ? ?EKG normal sinus rhythm with no acute ST elevation or depression.  QTc 435 ? ?Assessment and Plan: ?Cannabinoid hyperemesis syndrome ?[name] is placed on telemetry.  ?Given antiemetics with zofran as needed ?IVF hydration with LR. Given bolus in ER ?Advised to quit marijuana use. May need social work to help with finding rehab program ? ?Hypertensive urgency ?Given dose of hydralazine IV now. Monitor BP.  ?Was given po lisinopril earlier in the ER but vomited within a few  minutes of the dose and BP never lowered.  ?NTG paste placed in ER but wiped off by patient. Pt developed headache after it was placed ? ?COPD (chronic obstructive pulmonary disease)  ?duoneb as needed ? ?Advance Care Planning:    Code Status:  Full Code.  Padua score low. Early ambulation and TED hose for DVT prophylaxis ? ?Family Communication: Diagnosis and plan discussed with patient and her mother who is at bedside.  Verbalized understanding and agree with plan.  Questions were answered.  Further recommendations to follow as clinically indicated ? ?Author: ?Carlton Adam, MD ?04/30/2021 3:46 AM ? ?For on call review [email or web address].  ?

## 2021-04-30 NOTE — Assessment & Plan Note (Addendum)
Christine Mueller is placed on telemetry.  ?Given antiemetics with phenergen as needed ?IVF hydration with LR. Given bolus in ER ?Advised to quit marijuana use. May need social work to help with finding rehab program ?

## 2021-04-30 NOTE — ED Provider Notes (Signed)
Signout from [place] PA-C at shift change. Briefly, patient presents for vomiting started around 3 PM today.  Patient's mother at bedside who contributes additional history.  No adult surgeries.  Patient has had episodes of vomiting similarly in the past, several ED visits in July 2022.  Blood pressure has been very high here.  This was also the case in July.  She is on lisinopril 5 mg tablets.  Recently saw her PCP for refills. ?  ?Plan: Patient given lisinopril and Nitro paste.  Will need reassessment of nausea vomiting and blood pressure. ?  ?2:38 AM Reassessment performed. Patient appears sleepy after being given IV Valium. ? ?On exam, abdomen is soft and nontender.  Patient continues to state significant nausea. ? ?Labs and imaging personally reviewed and interpreted including: Negative pregnancy; CBC with white blood cell count 14,000; glucose 191 with normal bicarb and anion gap; normal lipase; troponin 4; UA cloudy without signs of infection; UDS positive for THC.  ? ?EKG reviewed showing sinus bradycardia at 50. Bradycardia at 50.  ?  ?Reviewed additional pertinent lab work and imaging with patient and mother at bedside.  Questions answered. Most current vital signs reviewed and are as follows: ?BP (!) 209/106   Pulse 63   Temp 97.7 ?F (36.5 ?C) (Oral)   Resp 16   Ht 5\' 7"  (1.702 m)   Wt 72.6 kg   LMP 04/23/2021   SpO2 97%   BMI 25.06 kg/m?  ? ?Plan: Additional IV fluids.  Patient last vomited around 2 AM. ?  ?Plan for admission for symptom control.  ? ?2:52 AM consulted with Dr. 06/23/2021.  Discussed blood pressure management with him.  I have ordered 10 mg IV Cardizem as BP remains elevated after lisinopril and nitro paste given earlier. ? ?CRITICAL CARE ?Performed by: Rachael Darby PA-C ?Total critical care time: 35 minutes ?Critical care time was exclusive of separately billable procedures and treating other patients. ?Critical care was necessary to treat or prevent imminent or life-threatening  deterioration. ?Critical care was time spent personally by me on the following activities: development of treatment plan with patient and/or surrogate as well as nursing, discussions with consultants, evaluation of patient's response to treatment, examination of patient, obtaining history from patient or surrogate, ordering and performing treatments and interventions, ordering and review of laboratory studies, ordering and review of radiographic studies, pulse oximetry and re-evaluation of patient's condition. ? ? ? ?  ?Renne Crigler, PA-C ?04/30/21 06/30/21 ? ?  ?[id number], MD ?04/30/21 [phone] ? ?

## 2021-05-01 DIAGNOSIS — F1290 Cannabis use, unspecified, uncomplicated: Secondary | ICD-10-CM | POA: Diagnosis not present

## 2021-05-01 DIAGNOSIS — R001 Bradycardia, unspecified: Secondary | ICD-10-CM | POA: Diagnosis not present

## 2021-05-01 DIAGNOSIS — Z79899 Other long term (current) drug therapy: Secondary | ICD-10-CM | POA: Diagnosis not present

## 2021-05-01 DIAGNOSIS — Z8249 Family history of ischemic heart disease and other diseases of the circulatory system: Secondary | ICD-10-CM | POA: Diagnosis not present

## 2021-05-01 DIAGNOSIS — Z20822 Contact with and (suspected) exposure to covid-19: Secondary | ICD-10-CM | POA: Diagnosis not present

## 2021-05-01 DIAGNOSIS — Z87891 Personal history of nicotine dependence: Secondary | ICD-10-CM | POA: Diagnosis not present

## 2021-05-01 DIAGNOSIS — Z9114 Patient's other noncompliance with medication regimen: Secondary | ICD-10-CM | POA: Diagnosis not present

## 2021-05-01 DIAGNOSIS — Z880 Allergy status to penicillin: Secondary | ICD-10-CM | POA: Diagnosis not present

## 2021-05-01 DIAGNOSIS — I160 Hypertensive urgency: Secondary | ICD-10-CM | POA: Diagnosis not present

## 2021-05-01 DIAGNOSIS — J449 Chronic obstructive pulmonary disease, unspecified: Secondary | ICD-10-CM | POA: Diagnosis not present

## 2021-05-01 DIAGNOSIS — I701 Atherosclerosis of renal artery: Secondary | ICD-10-CM | POA: Diagnosis not present

## 2021-05-01 DIAGNOSIS — R1110 Vomiting, unspecified: Secondary | ICD-10-CM | POA: Diagnosis not present

## 2021-05-01 DIAGNOSIS — R112 Nausea with vomiting, unspecified: Secondary | ICD-10-CM | POA: Diagnosis not present

## 2021-05-01 DIAGNOSIS — R519 Headache, unspecified: Secondary | ICD-10-CM | POA: Diagnosis not present

## 2021-05-01 DIAGNOSIS — I10 Essential (primary) hypertension: Secondary | ICD-10-CM | POA: Diagnosis not present

## 2021-05-01 DIAGNOSIS — F12988 Cannabis use, unspecified with other cannabis-induced disorder: Secondary | ICD-10-CM | POA: Diagnosis present

## 2021-05-01 LAB — BASIC METABOLIC PANEL
Anion gap: 15 (ref 5–15)
BUN: 19 mg/dL (ref 6–20)
CO2: 21 mmol/L — ABNORMAL LOW (ref 22–32)
Calcium: 10 mg/dL (ref 8.9–10.3)
Chloride: 99 mmol/L (ref 98–111)
Creatinine, Ser: 0.92 mg/dL (ref 0.44–1.00)
GFR, Estimated: >60 mL/min (ref >60)
Glucose, Bld: 129 mg/dL — ABNORMAL HIGH (ref 70–99)
Potassium: 3.4 mmol/L — ABNORMAL LOW (ref 3.5–5.1)
Sodium: 135 mmol/L (ref 135–145)

## 2021-05-01 LAB — CBC
HCT: 49.7 % — ABNORMAL HIGH (ref 36.0–46.0)
Hemoglobin: 16.4 g/dL — ABNORMAL HIGH (ref 12.0–15.0)
MCH: 30 pg (ref 26.0–34.0)
MCHC: 33 g/dL (ref 30.0–36.0)
MCV: 91 fL (ref 80.0–100.0)
Platelets: 369 10*3/uL (ref 150–400)
RBC: 5.46 MIL/uL — ABNORMAL HIGH (ref 3.87–5.11)
RDW: 12.6 % (ref 11.5–15.5)
WBC: 15.9 10*3/uL — ABNORMAL HIGH (ref 4.0–10.5)
nRBC: 0 % (ref 0.0–0.2)

## 2021-05-01 LAB — MAGNESIUM: Magnesium: 2 mg/dL (ref 1.7–2.4)

## 2021-05-01 LAB — PHOSPHORUS: Phosphorus: 3 mg/dL (ref 2.5–4.6)

## 2021-05-01 MED ORDER — SPIRONOLACTONE 12.5 MG HALF TABLET
12.5000 mg | ORAL_TABLET | Freq: Every day | ORAL | Status: DC
  Administered 2021-05-01 – 2021-05-02 (×2): 12.5 mg via ORAL
  Filled 2021-05-01 (×2): qty 1

## 2021-05-01 MED ORDER — METOPROLOL TARTRATE 25 MG PO TABS: 25.0000 mg | ORAL_TABLET | ORAL | Status: DC

## 2021-05-01 MED ORDER — POTASSIUM CHLORIDE CRYS ER 20 MEQ PO TBCR
40.0000 meq | EXTENDED_RELEASE_TABLET | Freq: Two times a day (BID) | ORAL | Status: AC
  Administered 2021-05-01 (×2): 40 meq via ORAL
  Filled 2021-05-01 (×2): qty 2

## 2021-05-01 MED ORDER — HYDRALAZINE HCL 25 MG PO TABS
25.0000 mg | ORAL_TABLET | Freq: Three times a day (TID) | ORAL | Status: DC
Start: 2021-05-01 — End: 2021-05-02
  Administered 2021-05-01 – 2021-05-02 (×5): 25 mg via ORAL
  Filled 2021-05-01 (×5): qty 1

## 2021-05-01 MED ORDER — METOPROLOL TARTRATE 50 MG PO TABS
50.0000 mg | ORAL_TABLET | Freq: Two times a day (BID) | ORAL | Status: DC
  Administered 2021-05-01 – 2021-05-02 (×3): 50 mg via ORAL
  Filled 2021-05-01 (×3): qty 1

## 2021-05-01 MED ORDER — HYDRALAZINE HCL 10 MG PO TABS
10.0000 mg | ORAL_TABLET | Freq: Three times a day (TID) | ORAL | Status: DC
  Administered 2021-05-01: 10 mg via ORAL
  Filled 2021-05-01: qty 1

## 2021-05-01 NOTE — Progress Notes (Signed)
Pt  refusing cardiac monitoring. Dr Margo Aye notified .Orders received to discontinue it. ?

## 2021-05-01 NOTE — Progress Notes (Signed)
Pt. Called out for 6/10 headache, nurse took BP prior to administering PRN tylenol. No PO PRN medication available to be given. Pt refused IV placement after the nurse educated the pt, provider notified at 58. Nurse ordered to give PO scheduled medication early.  ?

## 2021-05-01 NOTE — Progress Notes (Signed)
Pt refused to wear tele monitoring overnight and kept taking it off.  ? ?Patient lost previous IV team IV around 0400. IV team came and placed another IV at 0456.  ?When entering the patient's room to administer 0600 medicine, the patient informed this RN that she had gotten into the shower and forgotten about the new IV and that IV is now out as well. Patient currently with no IV access. Day shift nurse will be made aware.  ?

## 2021-05-01 NOTE — Progress Notes (Signed)
PROGRESS NOTE  Christine DanDalila L Volpi ZOX:[id number] DOB: [birth year] DOA: 04/29/2021 PCP: Shirlean MylarMahoney, Caitlin, MD  HPI/Recap of past 24 hours: Christine DanDalila L Mueller is a 46 y.o. female with medical history significant of COPD, ongoing THC use, HTN who presents for evaluation of repeated bouts of vomiting.  Reportedly she was driving to work when she began to have vomiting and felt nauseous so she went home.  She continued to vomit multiple times and was brought to the emergency room.  She had similar presentation a year ago for intractable vomiting.  Noted in the emergency room to have significantly elevated blood pressures in the 200's systolic.  She drinks alcohol occasionally with last use the night prior to admission.  Work-up revealed refractory hypertension.  Started on 3 oral antihypertensive.  Renal artery ultrasound ordered to rule out secondary hypertension.  We will also obtain fasting lipid panel in the morning.  Patient will need to follow-up with hypertension clinic at discharge.  05/01/2021: Patient was seen at bedside.  Her mother and son were present at bedside.  Denies any neurological symptoms.  Assessment/Plan: Principal Problem:   Cannabinoid hyperemesis syndrome Active Problems:   Hypertensive urgency   COPD (chronic obstructive pulmonary disease) (HCC)  Resolved cannabinoid hyperemesis syndrome Tolerating a diet Advance as tolerated IV anti emetics PRN   Refractory hypertension, rule out secondary hypertension Currently on Norvasc 10 mg daily, Lopressor 50 mg twice daily, spironolactone 12.5 mg daily, hydralazine 25 mg 3 times daily. IV hydralazine as needed with parameters. Continue to closely monitor vital signs. Renal artery duplex ultrasound ordered on 05/01/2021, follow-up.   COPD (chronic obstructive pulmonary disease)  duoneb as needed  Occasional alcohol use Per patient she drinks liquor occasionally, she states once every blue moon. No evidence of alcohol  withdrawal at time of this visit.   Advance Care Planning:    Code Status:  Full Code.  Padua score low. Early ambulation and TED hose for DVT prophylaxis   Family Communication: Mother at bedside has been updated.     Consultants: None.  Procedures: None.   Status is: Inpatient Patient requires at least 2 midnights for further evaluation and treatment of present condition.    Objective: Vitals:   05/01/21 0358 05/01/21 0731 05/01/21 0949 05/01/21 1317  BP: (!) 205/117 (!) 207/111 (!) 173/120 (!) 176/113  Pulse: 98 74 78 81  Resp: 14 18 18 14   Temp: 98.4 F (36.9 C) 98.5 F (36.9 C) 98.7 F (37.1 C) 98.1 F (36.7 C)  TempSrc: Oral Oral Oral Oral  SpO2: 98% 100% 100% 99%  Weight:      Height:        Intake/Output Summary (Last 24 hours) at 05/01/2021 1403 Last data filed at 05/01/2021 0600 Gross per 24 hour  Intake 222.89 ml  Output 0 ml  Net 222.89 ml   Filed Weights   04/29/21 1705 04/29/21 1959  Weight: 72.6 kg 72.6 kg    Exam:  General: 46 y.o. year-old female well developed well nourished in no acute distress.  Alert and oriented x3. Cardiovascular: Regular rate and rhythm with no rubs or gallops.  No thyromegaly or JVD noted.   Respiratory: Clear to auscultation with no wheezes or rales. Good inspiratory effort. Abdomen: Soft nontender nondistended with normal bowel sounds x4 quadrants. Musculoskeletal: No lower extremity edema. 2/4 pulses in all 4 extremities. Skin: No ulcerative lesions noted or rashes, Psychiatry: Mood is appropriate for condition and setting   Data Reviewed: CBC: Recent Labs  Lab 04/29/21 2030 04/30/21 0635 05/01/21 0538  WBC 14.0* 15.2* 15.9*  NEUTROABS 11.9*  --   --   HGB 13.5 13.7 16.4*  HCT 42.1 41.4 49.7*  MCV 95.2 93.2 91.0  PLT 328 295 369   Basic Metabolic Panel: Recent Labs  Lab 04/29/21 2030 04/30/21 0635 05/01/21 0538  NA 140 135 135  K 3.5 3.4* 3.4*  CL 104 102 99  CO2 22 17* 21*  GLUCOSE 191*  221* 129*  BUN 11 8 19   CREATININE 0.74 0.69 0.92  CALCIUM 9.5 8.9 10.0  MG  --   --  2.0  PHOS  --   --  3.0   GFR: Estimated Creatinine Clearance: 75.1 mL/min (by C-G formula based on SCr of 0.92 mg/dL). Liver Function Tests: Recent Labs  Lab 04/29/21 2030  AST 26  ALT 19  ALKPHOS 101  BILITOT 0.6  PROT 8.4*  ALBUMIN 4.9   Recent Labs  Lab 04/29/21 2030  LIPASE 26   No results for input(s): AMMONIA in the last 168 hours. Coagulation Profile: No results for input(s): INR, PROTIME in the last 168 hours. Cardiac Enzymes: No results for input(s): CKTOTAL, CKMB, CKMBINDEX, TROPONINI in the last 168 hours. BNP (last 3 results) No results for input(s): PROBNP in the last 8760 hours. HbA1C: No results for input(s): HGBA1C in the last 72 hours. CBG: No results for input(s): GLUCAP in the last 168 hours. Lipid Profile: No results for input(s): CHOL, HDL, LDLCALC, TRIG, CHOLHDL, LDLDIRECT in the last 72 hours. Thyroid Function Tests: Recent Labs    04/30/21 1635  TSH 0.709   Anemia Panel: No results for input(s): VITAMINB12, FOLATE, FERRITIN, TIBC, IRON, RETICCTPCT in the last 72 hours. Urine analysis:    Component Value Date/Time   COLORURINE YELLOW 04/29/2021 1754   APPEARANCEUR CLOUDY (A) 04/29/2021 1754   LABSPEC 1.016 04/29/2021 1754   PHURINE 7.0 04/29/2021 1754   GLUCOSEU NEGATIVE 04/29/2021 1754   HGBUR NEGATIVE 04/29/2021 1754   BILIRUBINUR NEGATIVE 04/29/2021 1754   BILIRUBINUR negative 03/29/2019 0856   KETONESUR NEGATIVE 04/29/2021 1754   PROTEINUR NEGATIVE 04/29/2021 1754   UROBILINOGEN 0.2 03/29/2019 0856   UROBILINOGEN 1.0 08/25/2015 0825   NITRITE NEGATIVE 04/29/2021 1754   LEUKOCYTESUR NEGATIVE 04/29/2021 1754   Sepsis Labs: @LABRCNTIP (procalcitonin:4,lacticidven:4)  ) Recent Results (from the past 240 hour(s))  Resp Panel by RT-PCR (Flu A&B, Covid) Nasopharyngeal Swab     Status: None   Collection Time: 04/30/21  3:00 AM   Specimen:  Nasopharyngeal Swab; Nasopharyngeal(NP) swabs in vial transport medium  Result Value Ref Range Status   SARS Coronavirus 2 by RT PCR NEGATIVE NEGATIVE Final    Comment: (NOTE) SARS-CoV-2 target nucleic acids are NOT DETECTED.  The SARS-CoV-2 RNA is generally detectable in upper respiratory specimens during the acute phase of infection. The lowest concentration of SARS-CoV-2 viral copies this assay can detect is 138 copies/mL. A negative result does not preclude SARS-Cov-2 infection and should not be used as the sole basis for treatment or other patient management decisions. A negative result may occur with  improper specimen collection/handling, submission of specimen other than nasopharyngeal swab, presence of viral mutation(s) within the areas targeted by this assay, and inadequate number of viral copies(<138 copies/mL). A negative result must be combined with clinical observations, patient history, and epidemiological information. The expected result is Negative.  Fact Sheet for Patients:   Fact Sheet for Healthcare Providers:  06/30/21  This test is no t yet approved or  cleared by the [place] and  has been authorized for detection and/or diagnosis of SARS-CoV-2 by FDA under an Emergency Use Authorization (EUA). This EUA will remain  in effect (meaning this test can be used) for the duration of the COVID-19 declaration under Section 564(b)(1) of the Act, 21 U.S.C.section 360bbb-3(b)(1), unless the authorization is terminated  or revoked sooner.       Influenza A by PCR NEGATIVE NEGATIVE Final   Influenza B by PCR NEGATIVE NEGATIVE Final    Comment: (NOTE) The Xpert Xpress SARS-CoV-2/FLU/RSV plus assay is intended as an aid in the diagnosis of influenza from Nasopharyngeal swab specimens and should not be used as a sole basis for treatment. Nasal washings and aspirates are unacceptable for  Xpert Xpress SARS-CoV-2/FLU/RSV testing.  Fact Sheet for Patients: BloggerCourse.com  Fact Sheet for Healthcare Providers: SeriousBroker.it  This test is not yet approved or cleared by the [place] FDA and has been authorized for detection and/or diagnosis of SARS-CoV-2 by FDA under an Emergency Use Authorization (EUA). This EUA will remain in effect (meaning this test can be used) for the duration of the COVID-19 declaration under Section 564(b)(1) of the Act, 21 U.S.C. section 360bbb-3(b)(1), unless the authorization is terminated or revoked.  Performed at [facility], [address], [place], [place] [address]       Studies: No results found.  Scheduled Meds:  amLODipine  10 mg Oral Daily   hydrALAZINE  25 mg Oral Q8H   metoprolol tartrate  50 mg Oral BID   potassium chloride  40 mEq Oral BID   sodium bicarbonate  1,300 mg Oral BID    Continuous Infusions:   LOS: 0 days     Darlin Drop, MD [facility] Pager [phone]  If 7PM-7AM, please contact night-coverage [email or web address] Password [email or web address] 05/01/2021, 2:03 PM

## 2021-05-02 ENCOUNTER — Inpatient Hospital Stay (HOSPITAL_COMMUNITY): Payer: Medicaid Other

## 2021-05-02 DIAGNOSIS — I5040 Unspecified combined systolic (congestive) and diastolic (congestive) heart failure: Secondary | ICD-10-CM

## 2021-05-02 DIAGNOSIS — I10 Essential (primary) hypertension: Secondary | ICD-10-CM | POA: Diagnosis not present

## 2021-05-02 DIAGNOSIS — R112 Nausea with vomiting, unspecified: Secondary | ICD-10-CM | POA: Diagnosis not present

## 2021-05-02 DIAGNOSIS — I701 Atherosclerosis of renal artery: Secondary | ICD-10-CM | POA: Diagnosis not present

## 2021-05-02 LAB — ECHOCARDIOGRAM COMPLETE
Area-P 1/2: 3.77 cm2
Height: 67 in
S' Lateral: 2 cm
Weight: 2560 oz

## 2021-05-02 LAB — CBC
HCT: 47.4 % — ABNORMAL HIGH (ref 36.0–46.0)
Hemoglobin: 16.2 g/dL — ABNORMAL HIGH (ref 12.0–15.0)
MCH: 30.7 pg (ref 26.0–34.0)
MCHC: 34.2 g/dL (ref 30.0–36.0)
MCV: 89.9 fL (ref 80.0–100.0)
Platelets: 338 10*3/uL (ref 150–400)
RBC: 5.27 MIL/uL — ABNORMAL HIGH (ref 3.87–5.11)
RDW: 12.4 % (ref 11.5–15.5)
WBC: 10.2 10*3/uL (ref 4.0–10.5)
nRBC: 0 % (ref 0.0–0.2)

## 2021-05-02 LAB — POTASSIUM: Potassium: 4.1 mmol/L (ref 3.5–5.1)

## 2021-05-02 MED ORDER — HYDRALAZINE HCL 25 MG PO TABS: 25.0000 mg | ORAL_TABLET | Freq: Three times a day (TID) | ORAL | 0 refills | Status: DC

## 2021-05-02 MED ORDER — LACTATED RINGERS IV SOLN: INTRAVENOUS | Status: DC

## 2021-05-02 MED ORDER — SPIRONOLACTONE 25 MG PO TABS
12.5000 mg | ORAL_TABLET | Freq: Every day | ORAL | 0 refills | Status: DC
Start: 2021-05-03 — End: 2021-08-04

## 2021-05-02 MED ORDER — ATORVASTATIN CALCIUM 40 MG PO TABS
40.0000 mg | ORAL_TABLET | Freq: Every day | ORAL | Status: DC
  Administered 2021-05-02: 40 mg via ORAL
  Filled 2021-05-02: qty 1

## 2021-05-02 MED ORDER — AMLODIPINE BESYLATE 10 MG PO TABS: 10.0000 mg | ORAL_TABLET | Freq: Every day | ORAL | 0 refills | Status: DC

## 2021-05-02 MED ORDER — METOPROLOL TARTRATE 50 MG PO TABS: 50.0000 mg | ORAL_TABLET | Freq: Two times a day (BID) | ORAL | 0 refills | Status: DC

## 2021-05-02 NOTE — Consult Note (Signed)
Hospital Consult    Reason for Consult:  Dr. Nevada Crane Referring Physician:  renal artery stenosis MRN #:  [id number]  History of Present Illness: This is a 46 y.o. female without significant vascular history was admitted with multiple episodes of vomiting and noted to be significantly hypertensive.  She has had hypertension for multiple years.  At home she is prescribed lisinopril only but has not been compliant with this.  She has undergone renal artery duplex which demonstrates possibly low-grade stenosis.  She had associated headache.  These have resolved.  Blood pressure has been better controlled here with 4 agents.  Past Medical History:  Diagnosis Date   Asthma    COPD (chronic obstructive pulmonary disease) ([place])    Essential hypertension 12/23/2015   Gestational diabetes    Hypertension     Past Surgical History:  Procedure Laterality Date   CESAREAN SECTION N/A 09/04/2015   Procedure: CESAREAN SECTION;  Surgeon: Florian Buff, MD;  Location: [facility];  Service: Obstetrics;  Laterality: N/A;   wisdom teeth removal      Allergies  Allergen Reactions   Penicillins Swelling, Rash and Other (See Comments)    Reaction:  Facial swelling  Has patient had a PCN reaction causing immediate rash, facial/tongue/throat swelling, SOB or lightheadedness with hypotension: Yes Has patient had a PCN reaction causing severe rash involving mucus membranes or skin necrosis: No Has patient had a PCN reaction that required hospitalization No Has patient had a PCN reaction occurring within the last 10 years: No If all of the above answers are "NO", then may proceed with Cephalosporin use.     Prior to Admission medications   Medication Sig Start Date End Date Taking? Authorizing Provider  lisinopril (ZESTRIL) 5 MG tablet Take 1 tablet (5 mg total) by mouth daily. 04/24/21  Yes Lilland, Alana, DO  amLODipine (NORVASC) 10 MG tablet Take 1 tablet (10 mg total) by mouth daily. 05/03/21  08/01/21  Kayleen Memos, DO  hydrALAZINE (APRESOLINE) 25 MG tablet Take 1 tablet (25 mg total) by mouth every 8 (eight) hours. 05/02/21 07/31/21  Kayleen Memos, DO  metoprolol tartrate (LOPRESSOR) 50 MG tablet Take 1 tablet (50 mg total) by mouth 2 (two) times daily. 05/02/21 07/31/21  Kayleen Memos, DO  spironolactone (ALDACTONE) 25 MG tablet Take 0.5 tablets (12.5 mg total) by mouth daily. 05/03/21 08/01/21  Kayleen Memos, DO  albuterol (PROVENTIL HFA;VENTOLIN HFA) 108 (90 Base) MCG/ACT inhaler Inhale 2 puffs into the lungs every 4 (four) hours as needed for wheezing or shortness of breath. Patient not taking: Reported on 04/13/2018 03/29/17 01/21/19  Rogue Bussing, MD    Social History   Socioeconomic History   Marital status: Single    Spouse name: Not on file   Number of children: Not on file   Years of education: Not on file   Highest education level: Not on file  Occupational History   Not on file  Tobacco Use   Smoking status: Former    Packs/day: 1.50    Years: 30.00    Pack years: 45.00    Types: Cigarettes    Quit date: 02/05/2019    Years since quitting: 2.2   Smokeless tobacco: Never   Tobacco comments:    Discussed need to quit for baby, asthma and COPD. Information given  Vaping Use   Vaping Use: Every day   Substances: Nicotine, THC, Flavoring  Substance and Sexual Activity   Alcohol use: No  Drug use: Yes    Types: Marijuana   Sexual activity: Yes    Birth control/protection: None  Other Topics Concern   Not on file  Social History Narrative   Not on file   Social Determinants of Health   Financial Resource Strain: Not on file  Food Insecurity: Not on file  Transportation Needs: Not on file  Physical Activity: Not on file  Stress: Not on file  Social Connections: Not on file  Intimate Partner Violence: Not on file     Family History  Problem Relation Age of Onset   Diabetes Mother    Hypertension Mother    Breast cancer Neg Hx      Review of Systems  Constitutional: Negative.   HENT: Negative.    Eyes: Negative.   Respiratory: Negative.    Gastrointestinal:  Positive for vomiting.  Skin: Negative.   Neurological:  Positive for headaches.  Psychiatric/Behavioral: Negative.       Physical Examination  Vitals:   05/02/21 0344 05/02/21 0521  BP: (!) 147/111 (!) 147/105  Pulse: 74   Resp: 17   Temp: 98.1 F (36.7 C)   SpO2: 100%    Body mass index is 25.06 kg/m.  Physical Exam HENT:     Head: Normocephalic.  Cardiovascular:     Rate and Rhythm: Normal rate.     Pulses:          Radial pulses are 2+ on the right side and 2+ on the left side.       Dorsalis pedis pulses are 2+ on the right side and 2+ on the left side.  Pulmonary:     Effort: Pulmonary effort is normal.  Musculoskeletal:        General: Normal range of motion.     Cervical back: Neck supple.     Right lower leg: No edema.     Left lower leg: No edema.  Skin:    General: Skin is warm and dry.     Capillary Refill: Capillary refill takes less than 2 seconds.  Neurological:     General: No focal deficit present.     Mental Status: She is alert.  Psychiatric:        Mood and Affect: Mood normal.        Behavior: Behavior normal.        Thought Content: Thought content normal.        Judgment: Judgment normal.     CBC    Component Value Date/Time   WBC 10.2 05/02/2021 0743   RBC 5.27 (H) 05/02/2021 0743   HGB 16.2 (H) 05/02/2021 0743   HGB 13.2 04/13/2018 1400   HCT 47.4 (H) 05/02/2021 0743   HCT 41.1 04/13/2018 1400   PLT 338 05/02/2021 0743   PLT 230 04/13/2018 1400   MCV 89.9 05/02/2021 0743   MCV 96 04/13/2018 1400   MCH 30.7 05/02/2021 0743   MCHC 34.2 05/02/2021 0743   RDW 12.4 05/02/2021 0743   RDW 11.8 04/13/2018 1400   LYMPHSABS 1.4 04/29/2021 2030   MONOABS 0.5 04/29/2021 2030   EOSABS 0.0 04/29/2021 2030   BASOSABS 0.1 04/29/2021 2030    BMET    Component Value Date/Time   NA 135 05/01/2021  0538   NA 141 04/24/2021 1111   K 4.1 05/02/2021 0743   CL 99 05/01/2021 0538   CO2 21 (L) 05/01/2021 0538   GLUCOSE 129 (H) 05/01/2021 0538   BUN 19 05/01/2021 0538   BUN  9 04/24/2021 1111   CREATININE 0.92 05/01/2021 0538   CREATININE 0.57 08/18/2015 0859   CALCIUM 10.0 05/01/2021 0538   GFRNONAA >60 05/01/2021 0538   GFRAA 113 02/19/2019 1501    COAGS: No results found for: INR, PROTIME   Non-Invasive Vascular Imaging:   Duplex Findings:  +--------------------+--------+--------+------+--------+   Mesenteric           PSV cm/s EDV cm/s Plaque Comments   +--------------------+--------+--------+------+--------+   Aorta Mid               87                               +--------------------+--------+--------+------+--------+   Celiac Artery Origin   119                               +--------------------+--------+--------+------+--------+   SMA Origin             150                               +--------------------+--------+--------+------+--------+              +------------------+--------+--------+-------+   Right Renal Artery PSV cm/s EDV cm/s Comment   +------------------+--------+--------+-------+   Origin               178       51              +------------------+--------+--------+-------+   Proximal             202       65              +------------------+--------+--------+-------+   Mid                  134       48              +------------------+--------+--------+-------+   Distal               103       38              +------------------+--------+--------+-------+   +-----------------+--------+--------+-------+   Left Renal Artery PSV cm/s EDV cm/s Comment   +-----------------+--------+--------+-------+   Origin              107       35              +-----------------+--------+--------+-------+   Proximal            104       36              +-----------------+--------+--------+-------+   Mid                  82       35               +-----------------+--------+--------+-------+   Distal               97       31              +-----------------+--------+--------+-------+   +------------+--------+--------+----+-----------+--------+--------+----+   Right Kidney PSV cm/s EDV cm/s RI   Left Kidney PSV cm/s EDV cm/s RI     +------------+--------+--------+----+-----------+--------+--------+----+   Upper Pole  29       10       0.67 Upper Pole  33       13       0.60   +------------+--------+--------+----+-----------+--------+--------+----+   Mid          37       11       0.70 Mid         39       16       0.59   +------------+--------+--------+----+-----------+--------+--------+----+   Lower Pole   31       12       0.60 Lower Pole  32       13       0.59   +------------+--------+--------+----+-----------+--------+--------+----+   Hilar        67       22       0.67 Hilar       42       15       0.64   +------------+--------+--------+----+-----------+--------+--------+----+   +------------------+-----+------------------+-----+   Right Kidney             Left Kidney                +------------------+-----+------------------+-----+   RAR                      RAR                        +------------------+-----+------------------+-----+   RAR (manual)       2.32  RAR (manual)       1.23    +------------------+-----+------------------+-----+   Cortex                   Cortex                     +------------------+-----+------------------+-----+   Cortex thickness         Corex thickness            +------------------+-----+------------------+-----+   Kidney length (cm) 10.12 Kidney length (cm) 10.09   +------------------+-----+------------------+-----+         Summary:  Renal:     Right: Normal size right kidney. 1-59% stenosis of the right renal         artery. Normal right Resisitive Index. RRV flow present.  Left:  Normal size of left kidney. Normal left Resistive Index. No         evidence of left renal  artery stenosis. LRV flow present.    ASSESSMENT/PLAN:  46 y.o. female with significant hypertension on 4 agents here in the hospital with low-grade stenosis of right renal artery.  Given her significant blood pressure on arrival I have recommended CT angio for better evaluation.  If this demonstrates high-grade stenosis we could consider intervention otherwise she will need medical therapy and can follow-up as an outpatient.  Kammy Klett C. Donzetta Matters, MD [facility] Office: [phone] Pager: [phone]

## 2021-05-02 NOTE — Progress Notes (Signed)
PROGRESS NOTE  Christine Mueller TGG:[id number] DOB: [birth year] DOA: 04/29/2021 PCP: Shirlean Mylar, MD  HPI/Recap of past 24 hours: Christine Mueller is a 46 y.o. female with medical history significant of COPD, ongoing THC use, HTN who presented to [facility] ED from home for evaluation of repeated bouts of vomiting.  She continued to vomit multiple times and was brought to the emergency room.  Noted in the emergency room to have significantly elevated blood pressures in the 200's systolic.  Work-up revealed refractory hypertension.  CT head unremarkable for any acute intracranial abnormalities.  Started on 4 oral antihypertensives.  Renal artery ultrasound done on 05/02/2021 showed right renal artery stenosis 1 to 59% for which vascular surgery was consulted.  CT angio abdomen and pelvis is pending.  05/02/2021: Patient was seen and examined at her bedside.  She reports having a headache 8 out of 10.  No nausea, no photophobia or sensitivity to noises.  Assessment/Plan: Principal Problem:   Cannabinoid hyperemesis syndrome Active Problems:   Hypertensive urgency   COPD (chronic obstructive pulmonary disease) ([facility])  Resolved cannabinoid hyperemesis syndrome Tolerating a diet Advance as tolerated IV anti emetics PRN   Refractory hypertension, suspect contributed by right renal artery stenosis Currently on Norvasc 10 mg daily, Lopressor 50 mg twice daily, spironolactone 12.5 mg daily, hydralazine 25 mg 3 times daily. IV hydralazine as needed with parameters. Continue to closely monitor vital signs. Renal artery duplex ultrasound ordered on 05/01/2021 revealed right renal artery stenosis 1 to 59%  Newly diagnosed right renal artery stenosis 1 to 59% Seen on renal artery duplex ultrasound CT angio abdomen and pelvis pending Obtain fasting lipid panel in the morning Started high intensity statin Lipitor at 40 mg daily Vascular surgery consulted, appreciate assistance.  COPD (chronic  obstructive pulmonary disease)  duoneb as needed  Occasional alcohol use Per patient she drinks liquor occasionally, she states once every blue moon. No evidence of alcohol withdrawal at time of this visit. No evidence of acute alcohol withdrawal at this time.    Critical care time: 65 minutes.     Advance Care Planning:    Code Status:  Full Code.  Padua score low. Early ambulation and TED hose for DVT prophylaxis   Family Communication: Mother at bedside has been updated.     Consultants: Vascular surgery  Procedures: Bilateral renal artery duplex ultrasound on 05/02/2021.   Status is: Inpatient Patient requires at least 2 midnights for further evaluation and treatment of present condition.    Objective: Vitals:   05/01/21 2213 05/02/21 0344 05/02/21 0521 05/02/21 1404  BP: (!) 175/112 (!) 147/111 (!) 147/105 (!) 146/104  Pulse: 71 74  88  Resp:  17  15  Temp:  98.1 F (36.7 C)  98 F (36.7 C)  TempSrc:  Oral  Oral  SpO2: 100% 100%  100%  Weight:      Height:        Intake/Output Summary (Last 24 hours) at 05/02/2021 1426 Last data filed at 05/02/2021 1002 Gross per 24 hour  Intake 240 ml  Output --  Net 240 ml   Filed Weights   04/29/21 1705 04/29/21 1959  Weight: 72.6 kg 72.6 kg    Exam:  General: 46 y.o. year-old female well-developed well-nourished in no acute distress.  She is alert and oriented x3.   Cardiovascular: Regular rate and rhythm no rubs or gallops.   Respiratory: Clear to auscultation no wheezes or rales.   Abdomen: Soft nontender bowel sounds  present. Musculoskeletal: No lower extremity edema bilaterally.   Skin: No ulcerative lesions noted. Psychiatry: Mood is appropriate for condition and setting. Neuro: Alert and awake.  Data Reviewed: CBC: Recent Labs  Lab 04/29/21 2030 04/30/21 0635 05/01/21 0538 05/02/21 0743  WBC 14.0* 15.2* 15.9* 10.2  NEUTROABS 11.9*  --   --   --   HGB 13.5 13.7 16.4* 16.2*  HCT 42.1 41.4  49.7* 47.4*  MCV 95.2 93.2 91.0 89.9  PLT 328 295 369 338   Basic Metabolic Panel: Recent Labs  Lab 04/29/21 2030 04/30/21 0635 05/01/21 0538 05/02/21 0743  NA 140 135 135  --   K 3.5 3.4* 3.4* 4.1  CL 104 102 99  --   CO2 22 17* 21*  --   GLUCOSE 191* 221* 129*  --   BUN --   CREATININE 0.74 0.69 0.92  --   CALCIUM 9.5 8.9 10.0  --   MG  --   --  2.0  --   PHOS  --   --  3.0  --    GFR: Estimated Creatinine Clearance: 75.1 mL/min (by C-G formula based on SCr of 0.92 mg/dL). Liver Function Tests: Recent Labs  Lab 04/29/21 2030  AST 26  ALT 19  ALKPHOS 101  BILITOT 0.6  PROT 8.4*  ALBUMIN 4.9   Recent Labs  Lab 04/29/21 2030  LIPASE 26   No results for input(s): AMMONIA in the last 168 hours. Coagulation Profile: No results for input(s): INR, PROTIME in the last 168 hours. Cardiac Enzymes: No results for input(s): CKTOTAL, CKMB, CKMBINDEX, TROPONINI in the last 168 hours. BNP (last 3 results) No results for input(s): PROBNP in the last 8760 hours. HbA1C: No results for input(s): HGBA1C in the last 72 hours. CBG: No results for input(s): GLUCAP in the last 168 hours. Lipid Profile: No results for input(s): CHOL, HDL, LDLCALC, TRIG, CHOLHDL, LDLDIRECT in the last 72 hours. Thyroid Function Tests: Recent Labs    04/30/21 1635  TSH 0.709   Anemia Panel: No results for input(s): VITAMINB12, FOLATE, FERRITIN, TIBC, IRON, RETICCTPCT in the last 72 hours. Urine analysis:    Component Value Date/Time   COLORURINE YELLOW 04/29/2021 1754   APPEARANCEUR CLOUDY (A) 04/29/2021 1754   LABSPEC 1.016 04/29/2021 1754   PHURINE 7.0 04/29/2021 1754   GLUCOSEU NEGATIVE 04/29/2021 1754   HGBUR NEGATIVE 04/29/2021 1754   BILIRUBINUR NEGATIVE 04/29/2021 1754   BILIRUBINUR negative 03/29/2019 0856   KETONESUR NEGATIVE 04/29/2021 1754   PROTEINUR NEGATIVE 04/29/2021 1754   UROBILINOGEN 0.2 03/29/2019 0856   UROBILINOGEN 1.0 08/25/2015 0825   NITRITE NEGATIVE  04/29/2021 1754   LEUKOCYTESUR NEGATIVE 04/29/2021 1754   Sepsis Labs: (procalcitonin:4,lacticidven:4)  ) Recent Results (from the past 240 hour(s))  Resp Panel by RT-PCR (Flu A&B, Covid) Nasopharyngeal Swab     Status: None   Collection Time: 04/30/21  3:00 AM   Specimen: Nasopharyngeal Swab; Nasopharyngeal(NP) swabs in vial transport medium  Result Value Ref Range Status   SARS Coronavirus 2 by RT PCR NEGATIVE NEGATIVE Final    Comment: (NOTE) SARS-CoV-2 target nucleic acids are NOT DETECTED.  The SARS-CoV-2 RNA is generally detectable in upper respiratory specimens during the acute phase of infection. The lowest concentration of SARS-CoV-2 viral copies this assay can detect is 138 copies/mL. A negative result does not preclude SARS-Cov-2 infection and should not be used as the sole basis for treatment or other patient management decisions. A negative result may occur  with  improper specimen collection/handling, submission of specimen other than nasopharyngeal swab, presence of viral mutation(s) within the areas targeted by this assay, and inadequate number of viral copies(<138 copies/mL). A negative result must be combined with clinical observations, patient history, and epidemiological information. The expected result is Negative.  Fact Sheet for Patients:  BloggerCourse.comhttps://www.fda.gov/media/152166/download  Fact Sheet for Healthcare Providers:  SeriousBroker.ithttps://www.fda.gov/media/152162/download  This test is no t yet approved or cleared by the [place] FDA and  has been authorized for detection and/or diagnosis of SARS-CoV-2 by FDA under an Emergency Use Authorization (EUA). This EUA will remain  in effect (meaning this test can be used) for the duration of the COVID-19 declaration under Section 564(b)(1) of the Act, 21 U.S.C.section 360bbb-3(b)(1), unless the authorization is terminated  or revoked sooner.       Influenza A by PCR NEGATIVE NEGATIVE Final   Influenza B  by PCR NEGATIVE NEGATIVE Final    Comment: (NOTE) The Xpert Xpress SARS-CoV-2/FLU/RSV plus assay is intended as an aid in the diagnosis of influenza from Nasopharyngeal swab specimens and should not be used as a sole basis for treatment. Nasal washings and aspirates are unacceptable for Xpert Xpress SARS-CoV-2/FLU/RSV testing.  Fact Sheet for Patients: BloggerCourse.comhttps://www.fda.gov/media/152166/download  Fact Sheet for Healthcare Providers: SeriousBroker.ithttps://www.fda.gov/media/152162/download  This test is not yet approved or cleared by the [place] FDA and has been authorized for detection and/or diagnosis of SARS-CoV-2 by FDA under an Emergency Use Authorization (EUA). This EUA will remain in effect (meaning this test can be used) for the duration of the COVID-19 declaration under Section 564(b)(1) of the Act, 21 U.S.C. section 360bbb-3(b)(1), unless the authorization is terminated or revoked.  Performed at [facility], [address] [place], [place] [address]       Studies: VAS US RENAL ARTERY DUPLEX  Result Date: 05/02/2021 ABDOMINAL VISCERAL Patient Name:  Christine Mueller  Date of Exam:   05/02/2021 Medical Rec #: [id number]          Accession #:    [id number] Date of Birth: July 25, 1975         Patient Gender: F Patient Age:   [age] years [facility] Procedure:      VAS US RENAL ARTERY DUPLEX Referring Phys: [id number] Panfilo Ketchum N Ilisha Blust -------------------------------------------------------------------------------- Indications: Hypertension Comparison Study: No prior studies. Performing Technologist: Jean Rosenthalachel Hodge RDMS, RVT  Examination Guidelines: A complete evaluation includes B-mode imaging, spectral Doppler, color Doppler, and power Doppler as needed of all accessible portions of each vessel. Bilateral testing is considered an integral part of a complete examination. Limited examinations for reoccurring indications may be performed as noted.  Duplex  Findings: +--------------------+--------+--------+------+--------+  Mesenteric           PSV cm/s EDV cm/s Plaque Comments  +--------------------+--------+--------+------+--------+  Aorta Mid               87                              +--------------------+--------+--------+------+--------+  Celiac Artery Origin   119                              +--------------------+--------+--------+------+--------+  SMA Origin             150                              +--------------------+--------+--------+------+--------+    +------------------+--------+--------+-------+  Right Renal Artery PSV cm/s EDV cm/s Comment  +------------------+--------+--------+-------+  Origin               178       51             +------------------+--------+--------+-------+  Proximal             202       65             +------------------+--------+--------+-------+  Mid                  134       48             +------------------+--------+--------+-------+  Distal               103       38             +------------------+--------+--------+-------+ +-----------------+--------+--------+-------+  Left Renal Artery PSV cm/s EDV cm/s Comment  +-----------------+--------+--------+-------+  Origin              107       35             +-----------------+--------+--------+-------+  Proximal            104       36             +-----------------+--------+--------+-------+  Mid                  82       35             +-----------------+--------+--------+-------+  Distal               97       31             +-----------------+--------+--------+-------+ +------------+--------+--------+----+-----------+--------+--------+----+  Right Kidney PSV cm/s EDV cm/s RI   Left Kidney PSV cm/s EDV cm/s RI    +------------+--------+--------+----+-----------+--------+--------+----+  Upper Pole   29       10       0.67 Upper Pole  33       13       0.60  +------------+--------+--------+----+-----------+--------+--------+----+  Mid          37       11        0.70 Mid         39       16       0.59  +------------+--------+--------+----+-----------+--------+--------+----+  Lower Pole   31       12       0.60 Lower Pole  32       13       0.59  +------------+--------+--------+----+-----------+--------+--------+----+  Hilar        67       22       0.67 Hilar       42       15       0.64  +------------+--------+--------+----+-----------+--------+--------+----+ +------------------+-----+------------------+-----+  Right Kidney             Left Kidney               +------------------+-----+------------------+-----+  RAR                      RAR                       +------------------+-----+------------------+-----+  RAR (manual)       2.32  RAR (manual)       1.23   +------------------+-----+------------------+-----+  Cortex                   Cortex                    +------------------+-----+------------------+-----+  Cortex thickness         Corex thickness           +------------------+-----+------------------+-----+  Kidney length (cm) 10.12 Kidney length (cm) 10.09  +------------------+-----+------------------+-----+   Summary: Renal:  Right: Normal size right kidney. 1-59% stenosis of the right renal        artery. Normal right Resisitive Index. RRV flow present. Left:  Normal size of left kidney. Normal left Resistive Index. No        evidence of left renal artery stenosis. LRV flow present.  *See table(s) above for measurements and observations.     Preliminary     Scheduled Meds:  amLODipine  10 mg Oral Daily   atorvastatin  40 mg Oral Daily   hydrALAZINE  25 mg Oral Q8H   metoprolol tartrate  50 mg Oral BID   sodium bicarbonate  1,300 mg Oral BID   spironolactone  12.5 mg Oral Daily    Continuous Infusions:  lactated ringers       LOS: 1 day     Darlin Drop, MD [facility] Pager [phone]  If 7PM-7AM, please contact night-coverage [email or web address] Password [email or web address] 05/02/2021, 2:26 PM

## 2021-05-02 NOTE — Progress Notes (Signed)
Renal artery duplex study completed.   Please see CV Proc for preliminary results.   Giulianna Rocha, RDMS, RVT  

## 2021-05-02 NOTE — Progress Notes (Signed)
Patient discharged to home with family. Discharge instructions reviewed with patient who verbalized understanding.  ?

## 2021-05-02 NOTE — Progress Notes (Signed)
?  Echocardiogram ?2D Echocardiogram has been performed. ? ?Christine Mueller ?05/02/2021, 3:59 PM ?

## 2021-05-02 NOTE — Discharge Summary (Addendum)
Discharge Summary  Christine Mueller ZOX:[id number] DOB: [birth year]  PCP: Shirlean Mylar, MD  Admit date: 04/29/2021 Discharge date: 05/02/2021  Time spent: 35 minutes.  Recommendations for Outpatient Follow-up:  Follow-up with the hypertension clinic within a week. Follow-up with your primary care provider in 1 to 2 weeks. Follow-up with vascular surgery in 1 to 2 weeks. Take your medications as prescribed.  Discharge Diagnoses:  Active Hospital Problems   Diagnosis Date Noted   Cannabinoid hyperemesis syndrome 04/30/2021    Priority: 1.   Hypertensive urgency 04/30/2021    Priority: 2.   COPD (chronic obstructive pulmonary disease) (HCC) 03/12/2015    Priority: 3.    Resolved Hospital Problems  No resolved problems to display.    Discharge Condition: Stable  Diet recommendation: Resume previous diet.  Vitals:   05/02/21 1404 05/02/21 1619  BP: (!) 146/104 (!) 143/95  Pulse: 88 80  Resp: 15   Temp: 98 F (36.7 C)   SpO2: 100%     History of present illness:   Christine Mueller is a 46 y.o. female with medical history significant of COPD, ongoing THC use, HTN who presented to [facility] ED from home for evaluation of repeated bouts of vomiting.  She continued to vomit multiple times and was brought to the emergency room.  Noted in the emergency room to have significantly elevated blood pressures in the 200's systolic.  Work-up revealed refractory hypertension.  CT head unremarkable for any acute intracranial abnormalities.  Started on 4 oral antihypertensives.  Renal artery ultrasound done on 05/02/2021 showed right renal artery stenosis 1 to 59% for which vascular surgery was consulted.  CT angio abdomen and pelvis ordered but difficulty obtaining IV access.  Patient wants to go home.  Discussed with vascular surgery, Dr. Randie Heinz, since no high-grade stenosis on renal artery Doppler ultrasound, no plan for vascular surgical intervention at this time.   The patient will follow-up  at hypertension clinic for management of her hypertension.  Patient advised and is receptive.  She states she will call the hypertension clinic and make an appointment to be seen on Monday, 05/04/2021.     Hospital Course:  Principal Problem:   Cannabinoid hyperemesis syndrome Active Problems:   Hypertensive urgency   COPD (chronic obstructive pulmonary disease) (HCC)  Resolved cannabinoid hyperemesis syndrome Tolerating a diet, nausea has resolved.   Refractory hypertension, suspect contributed by right renal artery stenosis Currently on Norvasc 10 mg daily, Lopressor 50 mg twice daily, spironolactone 12.5 mg daily, hydralazine 25 mg 3 times daily. Renal artery duplex ultrasound ordered on 05/01/2021 revealed right renal artery stenosis 1 to 59% Follow-up at the hypertension clinic.   Newly diagnosed right renal artery stenosis 1 to 59% Seen on renal artery duplex ultrasound Follow-up with hypertension clinic Follow-up with vascular surgery   COPD (chronic obstructive pulmonary disease)  No acute issues, O2 saturation 100% on ambient air.   Occasional alcohol use Per patient she drinks liquor occasionally, she states once every blue moon. No evidence of alcohol withdrawal at time of this visit.             Code Status:  Full Code.     Family Communication: Mother at bedside has been updated.         Consultants: Vascular surgery   Procedures: Bilateral renal artery duplex ultrasound on 05/02/2021.      Discharge Exam: BP (!) 143/95 (BP Location: Left Arm)    Pulse 80    Temp 98 F (36.7  C) (Oral)    Resp 15    Ht 5\' 7"  (1.702 m)    Wt 72.6 kg    LMP 04/23/2021    SpO2 100%    BMI 25.06 kg/m  General: 46 y.o. year-old female well developed well nourished in no acute distress.  Alert and oriented x3. Cardiovascular: Regular rate and rhythm with no rubs or gallops.  No thyromegaly or JVD noted.   Respiratory: Clear to auscultation with no wheezes or rales. Good  inspiratory effort. Abdomen: Soft nontender nondistended with normal bowel sounds x4 quadrants. Musculoskeletal: No lower extremity edema. 2/4 pulses in all 4 extremities. Skin: No ulcerative lesions noted or rashes, Psychiatry: Mood is appropriate for condition and setting  Discharge Instructions You were cared for by a hospitalist during your hospital stay. If you have any questions about your discharge medications or the care you received while you were in the hospital after you are discharged, you can call the unit and asked to speak with the hospitalist on call if the hospitalist that took care of you is not available. Once you are discharged, your primary care physician will handle any further medical issues. Please note that NO REFILLS for any discharge medications will be authorized once you are discharged, as it is imperative that you return to your primary care physician (or establish a relationship with a primary care physician if you do not have one) for your aftercare needs so that they can reassess your need for medications and monitor your lab values.   Allergies as of 05/02/2021       Reactions   Penicillins Swelling, Rash, Other (See Comments)   Reaction:  Facial swelling  Has patient had a PCN reaction causing immediate rash, facial/tongue/throat swelling, SOB or lightheadedness with hypotension: Yes Has patient had a PCN reaction causing severe rash involving mucus membranes or skin necrosis: No Has patient had a PCN reaction that required hospitalization No Has patient had a PCN reaction occurring within the last 10 years: No If all of the above answers are "NO", then may proceed with Cephalosporin use.        Medication List     STOP taking these medications    lisinopril 5 MG tablet Commonly known as: ZESTRIL       TAKE these medications    amLODipine 10 MG tablet Commonly known as: NORVASC Take 1 tablet (10 mg total) by mouth daily. Start taking on: May 03, 2021   hydrALAZINE 25 MG tablet Commonly known as: APRESOLINE Take 1 tablet (25 mg total) by mouth every 8 (eight) hours.   metoprolol tartrate 50 MG tablet Commonly known as: LOPRESSOR Take 1 tablet (50 mg total) by mouth 2 (two) times daily.   spironolactone 25 MG tablet Commonly known as: ALDACTONE Take 0.5 tablets (12.5 mg total) by mouth daily. Start taking on: May 03, 2021       Allergies  Allergen Reactions   Penicillins Swelling, Rash and Other (See Comments)    Reaction:  Facial swelling  Has patient had a PCN reaction causing immediate rash, facial/tongue/throat swelling, SOB or lightheadedness with hypotension: Yes Has patient had a PCN reaction causing severe rash involving mucus membranes or skin necrosis: No Has patient had a PCN reaction that required hospitalization No Has patient had a PCN reaction occurring within the last 10 years: No If all of the above answers are "NO", then may proceed with Cephalosporin use.     Follow-up Information  Shirlean Mylar, MD. Call today.   Specialty: Family Medicine Why: Please call for a posthospital follow-up appointment. Contact information: [address] [place] [address] [phone]         Chilton Si, MD. Call today.   Specialty: Cardiology Why: Please call for a posthospital follow-up appointment. Contact information: [address] [place] [address] [phone]         Maeola Harman, MD. Call today.   Specialties: Vascular Surgery, Cardiology Why: Please call for a posthospital follow up appointment Contact information: [address] [place] [address] [phone]                  The results of significant diagnostics from this hospitalization (including imaging, microbiology, ancillary and laboratory) are listed below for reference.    Significant Diagnostic Studies: CT Head Wo Contrast  Result Date: 04/29/2021 CLINICAL  DATA:  Chronic headaches EXAM: CT HEAD WITHOUT CONTRAST TECHNIQUE: Contiguous axial images were obtained from the base of the skull through the vertex without intravenous contrast. RADIATION DOSE REDUCTION: This exam was performed according to the departmental dose-optimization program which includes automated exposure control, adjustment of the mA and/or kV according to patient size and/or use of iterative reconstruction technique. COMPARISON:  None. FINDINGS: Brain: No evidence of acute infarction, hemorrhage, hydrocephalus, extra-axial collection or mass lesion/mass effect. Vascular: No hyperdense vessel or unexpected calcification. Skull: Normal. Negative for fracture or focal lesion. Sinuses/Orbits: No acute finding. Other: None. IMPRESSION: No acute intracranial abnormality noted. Electronically Signed   By: Alcide Clever M.D.   On: 04/29/2021 22:25   ECHOCARDIOGRAM COMPLETE  Result Date: 05/02/2021    ECHOCARDIOGRAM REPORT   Patient Name:   Christine Mueller Date of Exam: 05/02/2021 Medical Rec #:  [id number]         Height:       67.0 in Accession #:    [id number]        Weight:       160.0 lb Date of Birth:  06-22-1975        BSA:          1.839 m Patient Age:    45 years          BP:           147/105 mmHg Patient Gender: F                 HR:           71 bpm. Exam Location:  Inpatient Procedure: 2D Echo, 3D Echo, Cardiac Doppler and Color Doppler Indications:    I50.40* Unspecified combined systolic (congestive) and diastolic                 (congestive) heart failure  History:        Patient has no prior history of Echocardiogram examinations.                 COPD; Risk Factors:Hypertension. Marijuana use.  Sonographer:    Sheralyn Boatman RDCS Referring Phys: [id number] Javeon Macmurray N Issabela Lesko IMPRESSIONS  1. Left ventricular ejection fraction, by estimation, is 70 to 75%. Left ventricular ejection fraction by 3D volume is 75 %. The left ventricle has hyperdynamic function. The left ventricle has no regional wall motion  abnormalities. There is mild concentric left ventricular hypertrophy. Left ventricular diastolic parameters are consistent with Grade I diastolic dysfunction (impaired relaxation).  2. Right ventricular systolic function is normal. The right ventricular size is normal. Tricuspid regurgitation  signal is inadequate for assessing PA pressure.  3. The mitral valve is normal in structure. Trivial mitral valve regurgitation.  4. The aortic valve is tricuspid. Aortic valve regurgitation is not visualized. No aortic stenosis is present.  5. The inferior vena cava is normal in size with greater than 50% respiratory variability, suggesting right atrial pressure of 3 mmHg.  6. Liver cyst incidentally noted. Consider dedicated imaging. Comparison(s): No prior Echocardiogram. FINDINGS  Left Ventricle: Left ventricular ejection fraction, by estimation, is 70 to 75%. Left ventricular ejection fraction by 3D volume is 75 %. The left ventricle has hyperdynamic function. The left ventricle has no regional wall motion abnormalities. The left ventricular internal cavity size was normal in size. There is mild concentric left ventricular hypertrophy. Left ventricular diastolic parameters are consistent with Grade I diastolic dysfunction (impaired relaxation). Right Ventricle: The right ventricular size is normal. No increase in right ventricular wall thickness. Right ventricular systolic function is normal. Tricuspid regurgitation signal is inadequate for assessing PA pressure. Left Atrium: Left atrial size was normal in size. Right Atrium: Right atrial size was normal in size. Pericardium: There is no evidence of pericardial effusion. Mitral Valve: The mitral valve is normal in structure. Trivial mitral valve regurgitation. Tricuspid Valve: The tricuspid valve is normal in structure. Tricuspid valve regurgitation is trivial. Aortic Valve: The aortic valve is tricuspid. Aortic valve regurgitation is not visualized. No aortic stenosis is  present. Pulmonic Valve: The pulmonic valve was normal in structure. Pulmonic valve regurgitation is trivial. Aorta: The aortic root and ascending aorta are structurally normal, with no evidence of dilitation. Venous: The inferior vena cava is normal in size with greater than 50% respiratory variability, suggesting right atrial pressure of 3 mmHg. IAS/Shunts: The atrial septum is grossly normal.  LEFT VENTRICLE PLAX 2D LVIDd:         3.20 cm         Diastology LVIDs:         2.00 cm         LV e' medial:    6.64 cm/s LV PW:         1.10 cm         LV E/e' medial:  10.5 LV IVS:        1.10 cm         LV e' lateral:   10.40 cm/s LVOT diam:     2.10 cm         LV E/e' lateral: 6.7 LV SV:         88 LV SV Index:   48 LVOT Area:     3.46 cm        3D Volume EF                                LV 3D EF:    Left                                             ventricul                                             ar  ejection                                             fraction                                             by 3D                                             volume is                                             75 %.                                 3D Volume EF:                                3D EF:        75 %                                LV EDV:       115 ml                                LV ESV:       29 ml                                LV SV:        86 ml RIGHT VENTRICLE             IVC RV S prime:     17.25 cm/s  IVC diam: 1.30 cm TAPSE (M-mode): 2.5 cm LEFT ATRIUM             Index        RIGHT ATRIUM          Index LA diam:        2.80 cm 1.52 cm/m   RA Area:     7.11 cm LA Vol (A2C):   28.8 ml 15.66 ml/m  RA Volume:   10.60 ml 5.76 ml/m LA Vol (A4C):   26.5 ml 14.41 ml/m LA Biplane Vol: 27.6 ml 15.01 ml/m  AORTIC VALVE LVOT Vmax:   152.00 cm/s LVOT Vmean:  94.000 cm/s LVOT VTI:    0.254 m  AORTA Ao Root diam: 2.90 cm Ao Asc diam:  2.70 cm MITRAL VALVE MV  Area (PHT): 3.77 cm    SHUNTS MV Decel Time: 201 msec    Systemic VTI:  0.25 m MV E velocity: 69.70 cm/s  Systemic Diam: 2.10 cm MV A velocity: 70.40 cm/s MV E/A ratio:  0.99 Laurance Flatten MD Electronically signed by Laurance Flatten MD Signature Date/Time: 05/02/2021/5:02:17  PM    Final    VAS US RENAL ARTERY DUPLEX  Result Date: 05/02/2021 ABDOMINAL VISCERAL Patient Name:  Christine Mueller  Date of Exam:   05/02/2021 Medical Rec #: [id number]          Accession #:    [id number] Date of Birth: [birth year]         Patient Gender: F Patient Age:   66 years Exam Location:  [facility] Procedure:      VAS US RENAL ARTERY DUPLEX Referring Phys: [id number] Paschal Blanton N Yuridia Couts -------------------------------------------------------------------------------- Indications: Hypertension Comparison Study: No prior studies. Performing Technologist: Jean Rosenthal RDMS, RVT  Examination Guidelines: A complete evaluation includes B-mode imaging, spectral Doppler, color Doppler, and power Doppler as needed of all accessible portions of each vessel. Bilateral testing is considered an integral part of a complete examination. Limited examinations for reoccurring indications may be performed as noted.  Duplex Findings: +--------------------+--------+--------+------+--------+  Mesenteric           PSV cm/s EDV cm/s Plaque Comments  +--------------------+--------+--------+------+--------+  Aorta Mid               87                              +--------------------+--------+--------+------+--------+  Celiac Artery Origin   119                              +--------------------+--------+--------+------+--------+  SMA Origin             150                              +--------------------+--------+--------+------+--------+    +------------------+--------+--------+-------+  Right Renal Artery PSV cm/s EDV cm/s Comment  +------------------+--------+--------+-------+  Origin               178       51              +------------------+--------+--------+-------+  Proximal             202       65             +------------------+--------+--------+-------+  Mid                  134       48             +------------------+--------+--------+-------+  Distal               103       38             +------------------+--------+--------+-------+ +-----------------+--------+--------+-------+  Left Renal Artery PSV cm/s EDV cm/s Comment  +-----------------+--------+--------+-------+  Origin              107       35             +-----------------+--------+--------+-------+  Proximal            104       36             +-----------------+--------+--------+-------+  Mid                  82       35             +-----------------+--------+--------+-------+  Distal  97       31             +-----------------+--------+--------+-------+ +------------+--------+--------+----+-----------+--------+--------+----+  Right Kidney PSV cm/s EDV cm/s RI   Left Kidney PSV cm/s EDV cm/s RI    +------------+--------+--------+----+-----------+--------+--------+----+  Upper Pole   29       10       0.67 Upper Pole  33       13       0.60  +------------+--------+--------+----+-----------+--------+--------+----+  Mid          37       11       0.70 Mid         39       16       0.59  +------------+--------+--------+----+-----------+--------+--------+----+  Lower Pole   31       12       0.60 Lower Pole  32       13       0.59  +------------+--------+--------+----+-----------+--------+--------+----+  Hilar        67       22       0.67 Hilar       42       15       0.64  +------------+--------+--------+----+-----------+--------+--------+----+ +------------------+-----+------------------+-----+  Right Kidney             Left Kidney               +------------------+-----+------------------+-----+  RAR                      RAR                       +------------------+-----+------------------+-----+  RAR (manual)       2.32  RAR (manual)       1.23    +------------------+-----+------------------+-----+  Cortex                   Cortex                    +------------------+-----+------------------+-----+  Cortex thickness         Corex thickness           +------------------+-----+------------------+-----+  Kidney length (cm) 10.12 Kidney length (cm) 10.09  +------------------+-----+------------------+-----+   Summary: Renal:  Right: Normal size right kidney. 1-59% stenosis of the right renal        artery. Normal right Resisitive Index. RRV flow present. Left:  Normal size of left kidney. Normal left Resistive Index. No        evidence of left renal artery stenosis. LRV flow present.  *See table(s) above for measurements and observations.     Preliminary     Microbiology: Recent Results (from the past 240 hour(s))  Resp Panel by RT-PCR (Flu A&B, Covid) Nasopharyngeal Swab     Status: None   Collection Time: 04/30/21  3:00 AM   Specimen: Nasopharyngeal Swab; Nasopharyngeal(NP) swabs in vial transport medium  Result Value Ref Range Status   SARS Coronavirus 2 by RT PCR NEGATIVE NEGATIVE Final    Comment: (NOTE) SARS-CoV-2 target nucleic acids are NOT DETECTED.  The SARS-CoV-2 RNA is generally detectable in upper respiratory specimens during the acute phase of infection. The lowest concentration of SARS-CoV-2 viral copies this assay can detect is 138 copies/mL. A negative result does not preclude SARS-Cov-2 infection and should not be used as the sole basis for  treatment or other patient management decisions. A negative result may occur with  improper specimen collection/handling, submission of specimen other than nasopharyngeal swab, presence of viral mutation(s) within the areas targeted by this assay, and inadequate number of viral copies(<138 copies/mL). A negative result must be combined with clinical observations, patient history, and epidemiological information. The expected result is Negative.  Fact Sheet for Patients:   BloggerCourse.com  Fact Sheet for Healthcare Providers:  SeriousBroker.it  This test is no t yet approved or cleared by the [place] FDA and  has been authorized for detection and/or diagnosis of SARS-CoV-2 by FDA under an Emergency Use Authorization (EUA). This EUA will remain  in effect (meaning this test can be used) for the duration of the COVID-19 declaration under Section 564(b)(1) of the Act, 21 U.S.C.section 360bbb-3(b)(1), unless the authorization is terminated  or revoked sooner.       Influenza A by PCR NEGATIVE NEGATIVE Final   Influenza B by PCR NEGATIVE NEGATIVE Final    Comment: (NOTE) The Xpert Xpress SARS-CoV-2/FLU/RSV plus assay is intended as an aid in the diagnosis of influenza from Nasopharyngeal swab specimens and should not be used as a sole basis for treatment. Nasal washings and aspirates are unacceptable for Xpert Xpress SARS-CoV-2/FLU/RSV testing.  Fact Sheet for Patients: BloggerCourse.com  Fact Sheet for Healthcare Providers: SeriousBroker.it  This test is not yet approved or cleared by the [place] FDA and has been authorized for detection and/or diagnosis of SARS-CoV-2 by FDA under an Emergency Use Authorization (EUA). This EUA will remain in effect (meaning this test can be used) for the duration of the COVID-19 declaration under Section 564(b)(1) of the Act, 21 U.S.C. section 360bbb-3(b)(1), unless the authorization is terminated or revoked.  Performed at [facility], [address], [place], [place] [address]      Labs: Basic Metabolic Panel: Recent Labs  Lab 04/29/21 2030 04/30/21 0635 05/01/21 0538 05/02/21 0743  NA 140 135 135  --   K 3.5 3.4* 3.4* 4.1  CL 104 102 99  --   CO2 22 17* 21*  --   GLUCOSE 191* 221* 129*  --   BUN --   CREATININE 0.74 0.69 0.92  --   CALCIUM 9.5 8.9 10.0  --    MG  --   --  2.0  --   PHOS  --   --  3.0  --    Liver Function Tests: Recent Labs  Lab 04/29/21 2030  AST 26  ALT 19  ALKPHOS 101  BILITOT 0.6  PROT 8.4*  ALBUMIN 4.9   Recent Labs  Lab 04/29/21 2030  LIPASE 26   No results for input(s): AMMONIA in the last 168 hours. CBC: Recent Labs  Lab 04/29/21 2030 04/30/21 0635 05/01/21 0538 05/02/21 0743  WBC 14.0* 15.2* 15.9* 10.2  NEUTROABS 11.9*  --   --   --   HGB 13.5 13.7 16.4* 16.2*  HCT 42.1 41.4 49.7* 47.4*  MCV 95.2 93.2 91.0 89.9  PLT 328 295 369 338   Cardiac Enzymes: No results for input(s): CKTOTAL, CKMB, CKMBINDEX, TROPONINI in the last 168 hours. BNP: BNP (last 3 results) No results for input(s): BNP in the last 8760 hours.  ProBNP (last 3 results) No results for input(s): PROBNP in the last 8760 hours.  CBG: No results for input(s): GLUCAP in the last 168 hours.     Signed:  Darlin Drop, MD [facility] 05/02/2021, 5:42  PM

## 2021-05-05 ENCOUNTER — Other Ambulatory Visit: Payer: Self-pay

## 2021-05-05 ENCOUNTER — Ambulatory Visit (INDEPENDENT_AMBULATORY_CARE_PROVIDER_SITE_OTHER): Payer: Medicaid Other | Admitting: Family Medicine

## 2021-05-05 VITALS — BP 110/80 | HR 82 | Wt 159.6 lb

## 2021-05-05 DIAGNOSIS — I10 Essential (primary) hypertension: Secondary | ICD-10-CM | POA: Diagnosis not present

## 2021-05-05 DIAGNOSIS — I701 Atherosclerosis of renal artery: Secondary | ICD-10-CM | POA: Diagnosis not present

## 2021-05-05 NOTE — Patient Instructions (Signed)
I am glad you are doing so much better.  We are checking some lab work today and I will let you know the results when they return.  I do not think we need to follow-up with any specialist at this time unless anything changes with your blood pressure control.  I recommend following up with [place] in about 3 months. ?

## 2021-05-05 NOTE — Progress Notes (Signed)
? ? ?  SUBJECTIVE:  ? ?CHIEF COMPLAINT / HPI:  ? ?Hospital follow-up-emesis  hypertension: ?Patient was admitted for emesis and hypertension.  During hospitalization had CT head which was unremarkable for acute intercranial abnormalities.  Started on for oral antihypertensives.  Renal artery ultrasound was done and showed right renal artery stenosis of 1-59% and vascular surgery was consulted.  CT angio abdomen pelvis was ordered but had difficulty gaining IV access.  The patient requested discharge home.  The case was discussed with vascular surgery but since no high-grade stenosis on the renal artery ultrasound no plan for vascular intervention.  Patient was discharged with recommendation follow-up with hypertension clinic.  Additional recommendations include following with vascular surgery in 1 to 2 weeks.  Patient was discharged on amlodipine, hydralazine, metoprolol tartrate, spironolactone. ? ?She denies any issues today, states she has not yet followed up with any specialists. States she is taking the 4 BP meds without missing doses. Denies further emesis.  ? ?PERTINENT  PMH / PSH: COPD ? ?OBJECTIVE:  ? ?BP 110/80   Pulse 82   Wt 159 lb 9.6 oz (72.4 kg)   LMP 04/23/2021 (Exact Date)   SpO2 98%   BMI 25.00 kg/m?   ? ?General: NAD, pleasant, able to participate in exam ?Cardiac: RRR, no murmurs. ?Respiratory: CTAB, normal effort, No wheezes, rales or rhonchi ?Skin: warm and dry, no rashes noted ?Neuro: alert, no obvious focal deficits ?Psych: Normal affect and mood ? ?ASSESSMENT/PLAN:  ?  ?Hospital follow-up-emesis  hypertension: ?Blood pressure today of 110/80.  Emesis has improved.  Was discharged on hydralazine, amlodipine, metoprolol tartrate, spironolactone.  We will check a BMP today.  Discussed with patient that if her blood pressure continues to be well controlled on the current medication we do not have to refer her out to specialist at this time, however if her blood pressure becomes  uncontrolled we should get her in with vascular. Discussed case with Dr. Leveda Anna, will hold off on referring for renal artery stenosis as long as her blood pressure remains under good control.  Patient is going to keep a watch on her blood pressures at home and follow-up with [place] if she sees any blood pressures over 130 systolic on a regular basis.  Otherwise follow-up in 3 months. ? ?Jackelyn Poling, DO ?[facility]  ? ? ? ?

## 2021-05-07 ENCOUNTER — Encounter: Payer: Self-pay | Admitting: Family Medicine

## 2021-05-07 LAB — BASIC METABOLIC PANEL
BUN/Creatinine Ratio: 18 (ref 9–23)
BUN: 18 mg/dL (ref 6–24)
CO2: 17 mmol/L — ABNORMAL LOW (ref 20–29)
Calcium: 9.7 mg/dL (ref 8.7–10.2)
Chloride: 105 mmol/L (ref 96–106)
Creatinine, Ser: 1.02 mg/dL — ABNORMAL HIGH (ref 0.57–1.00)
Glucose: 103 mg/dL — ABNORMAL HIGH (ref 70–99)
Potassium: 4.2 mmol/L (ref 3.5–5.2)
Sodium: 143 mmol/L (ref 134–144)
eGFR: 69 mL/min/{1.73_m2} (ref >59)

## 2021-05-18 ENCOUNTER — Other Ambulatory Visit: Payer: Self-pay | Admitting: Family Medicine

## 2021-05-18 DIAGNOSIS — Z1231 Encounter for screening mammogram for malignant neoplasm of breast: Secondary | ICD-10-CM

## 2021-06-01 ENCOUNTER — Ambulatory Visit: Payer: Medicaid Other

## 2021-06-04 ENCOUNTER — Ambulatory Visit
Admission: RE | Admit: 2021-06-04 | Discharge: 2021-06-04 | Disposition: A | Discharge status: Home / Self Care | Payer: Medicaid Other | Source: Ambulatory Visit | Attending: Family Medicine | Admitting: Family Medicine

## 2021-06-04 DIAGNOSIS — Z1231 Encounter for screening mammogram for malignant neoplasm of breast: Secondary | ICD-10-CM | POA: Diagnosis not present

## 2021-06-08 ENCOUNTER — Other Ambulatory Visit: Payer: Self-pay | Admitting: Family Medicine

## 2021-06-08 ENCOUNTER — Encounter: Payer: Self-pay | Admitting: Family Medicine

## 2021-06-08 DIAGNOSIS — R928 Other abnormal and inconclusive findings on diagnostic imaging of breast: Secondary | ICD-10-CM

## 2021-06-22 ENCOUNTER — Ambulatory Visit: Payer: Medicaid Other

## 2021-06-22 ENCOUNTER — Ambulatory Visit
Admission: RE | Admit: 2021-06-22 | Discharge: 2021-06-22 | Disposition: A | Discharge status: Home / Self Care | Payer: Medicaid Other | Source: Ambulatory Visit | Attending: Family Medicine | Admitting: Family Medicine

## 2021-06-22 DIAGNOSIS — R928 Other abnormal and inconclusive findings on diagnostic imaging of breast: Secondary | ICD-10-CM

## 2021-06-22 DIAGNOSIS — R922 Inconclusive mammogram: Secondary | ICD-10-CM | POA: Diagnosis not present

## 2021-07-01 ENCOUNTER — Telehealth: Payer: Self-pay | Admitting: *Deleted

## 2021-07-01 NOTE — Telephone Encounter (Signed)
Attempted to call x  3 unable to leave message voicemail full. ?

## 2021-07-01 NOTE — Telephone Encounter (Signed)
Unable to reach pt. For pre-visit,procedure cancelled and no show letter sent. ?

## 2021-07-13 ENCOUNTER — Encounter: Payer: Medicaid Other | Admitting: Gastroenterology

## 2021-08-04 ENCOUNTER — Other Ambulatory Visit: Payer: Self-pay

## 2021-08-04 MED ORDER — HYDRALAZINE HCL 25 MG PO TABS: 25.0000 mg | ORAL_TABLET | Freq: Three times a day (TID) | ORAL | 0 refills | Status: DC

## 2021-08-04 MED ORDER — AMLODIPINE BESYLATE 10 MG PO TABS: 10.0000 mg | ORAL_TABLET | Freq: Every day | ORAL | 0 refills | Status: DC

## 2021-08-04 MED ORDER — METOPROLOL TARTRATE 50 MG PO TABS: 50.0000 mg | ORAL_TABLET | Freq: Two times a day (BID) | ORAL | 0 refills | Status: DC

## 2021-08-04 MED ORDER — SPIRONOLACTONE 25 MG PO TABS: 12.5000 mg | ORAL_TABLET | Freq: Every day | ORAL | 0 refills | Status: DC

## 2021-09-21 NOTE — Progress Notes (Deleted)
  SUBJECTIVE:   CHIEF COMPLAINT / HPI:   Hypertension:   today. Home medications include: amlodipine 10mg  daily, hydralazine 25mg  TID, lopressor 50mg  BID, spironolactone 12.5mg  daily. She {Blank single:19197::"endorses","does not endorse"} taking these medications as prescribed. {Blank single:19197::"Does","Does not"} check blood pressure at home.*** Denies any SOB, CP, vision changes, LE edema, medication SEs, or symptoms of hypotension. Diet ***. Exercise ***. Most recent creatinine trend:  Lab Results  Component Value Date   CREATININE 1.02 (H) 05/05/2021   CREATININE 0.92 05/01/2021   CREATININE 0.69 04/30/2021   Patient has had a BMP in the past 1 year.   PERTINENT  PMH / PSH: COPD, HTN  OBJECTIVE:  There were no vitals taken for this visit.  General: NAD, pleasant, able to participate in exam Cardiac: RRR, no murmurs auscultated Respiratory: CTAB, normal WOB Abdomen: soft, non-tender, non-distended, normoactive bowel sounds Extremities: warm and well perfused, no edema or cyanosis Skin: warm and dry, no rashes noted Neuro: alert, no obvious focal deficits, speech normal Psych: Normal affect and mood  ASSESSMENT/PLAN:  No problem-specific Assessment & Plan notes found for this encounter.   No orders of the defined types were placed in this encounter.  No orders of the defined types were placed in this encounter.  No follow-ups on file. 05/07/2021, DO 09/21/2021, 4:01 PM PGY-***, [facility] {    This will disappear when note is signed, click to select method of visit    :1}

## 2021-09-22 ENCOUNTER — Ambulatory Visit: Payer: Medicaid Other | Admitting: Student

## 2021-09-23 DIAGNOSIS — I10 Essential (primary) hypertension: Secondary | ICD-10-CM | POA: Diagnosis not present

## 2021-09-24 ENCOUNTER — Telehealth: Payer: Self-pay | Admitting: Family Medicine

## 2021-09-24 NOTE — Telephone Encounter (Signed)
Clinical info completed on FMLA form.  Given to PCP while in clinic today for completion.    When form is completed, please route note to "RN Team" and place in wall pocket in front office.   Aquilla Solian, CMA

## 2021-09-24 NOTE — Telephone Encounter (Signed)
Mother left paperwork for FMLA form completion last visit was on 04/24/21.  Any questions please call patient.  Put in blue folder

## 2021-09-25 NOTE — Telephone Encounter (Signed)
Patient returned call and she said that the FMLA is still for the reasoning she was seen in March.   I informed her she would not need another appointment.

## 2021-09-30 NOTE — Telephone Encounter (Signed)
Completed FMLA for inpatient hospitalization 04/29/21-05/02/21. Placed in RN Triage box  Vonna Drafts, MD

## 2021-10-01 NOTE — Telephone Encounter (Signed)
Forms placed up front for pick up.   Copy made for batch scanning.   Attempted to contact patient, however no answer.

## 2021-10-26 ENCOUNTER — Encounter (HOSPITAL_COMMUNITY): Payer: Self-pay | Admitting: *Deleted

## 2021-10-26 ENCOUNTER — Ambulatory Visit (HOSPITAL_COMMUNITY)
Admission: EM | Admit: 2021-10-26 | Discharge: 2021-10-26 | Disposition: A | Discharge status: Home / Self Care | Payer: Medicaid Other | Attending: Emergency Medicine | Admitting: Emergency Medicine

## 2021-10-26 DIAGNOSIS — Z20822 Contact with and (suspected) exposure to covid-19: Secondary | ICD-10-CM

## 2021-10-26 DIAGNOSIS — Z1152 Encounter for screening for COVID-19: Secondary | ICD-10-CM | POA: Insufficient documentation

## 2021-10-26 LAB — SARS CORONAVIRUS 2 BY RT PCR: SARS Coronavirus 2 by RT PCR: NEGATIVE

## 2021-10-26 NOTE — ED Triage Notes (Signed)
Denies any sxs; states she just wishes to have Covid test.

## 2021-10-26 NOTE — ED Provider Notes (Signed)
[facility]    CSN: [id number] Arrival date & time: 10/26/21  1213      History   Chief Complaint Chief Complaint  Patient presents with   Covid Test    HPI SHARALEE WITMAN is a 46 y.o. female.  Presents for COVID test. Denies any current symptoms or known exposures, just would like to be tested.  History of asthma and COPD.  Past Medical History:  Diagnosis Date   Asthma    COPD (chronic obstructive pulmonary disease) (HCC)    Essential hypertension 12/23/2015   Gestational diabetes    Hypertension     Patient Active Problem List   Diagnosis Date Noted   Abnormal mammogram of right breast 06/08/2021   Renal artery stenosis (HCC) 05/05/2021   Cannabinoid hyperemesis syndrome 04/30/2021   Hypertensive urgency 04/30/2021   Breast mass, right 05/04/2019   Low TSH level 02/19/2019   Abnormal uterine bleeding (AUB) 08/22/2017   Primary hypertension 04/12/2017   COPD (chronic obstructive pulmonary disease) (HCC) 03/12/2015   Asthma 03/12/2015   Ex-smoker for less than 1 year 03/12/2015    Past Surgical History:  Procedure Laterality Date   CESAREAN SECTION N/A 09/04/2015   Procedure: CESAREAN SECTION;  Surgeon: Lazaro Arms, MD;  Location: [facility];  Service: Obstetrics;  Laterality: N/A;   wisdom teeth removal      OB History     Gravida  4   Para  1   Term  1   Preterm      AB  1   Living  1      SAB  1   IAB      Ectopic      Multiple  0   Live Births  1        Obstetric Comments  Water broke- infant stress- caught in pelvis- nonprogression          Home Medications    Prior to Admission medications   Medication Sig Start Date End Date Taking? Authorizing Provider  amLODipine (NORVASC) 10 MG tablet Take 1 tablet (10 mg total) by mouth daily. 08/04/21 11/02/21 Yes Shirlean Mylar, MD  hydrALAZINE (APRESOLINE) 25 MG tablet Take 1 tablet (25 mg total) by mouth every 8 (eight) hours. 08/04/21 11/02/21 Yes  Shirlean Mylar, MD  metoprolol tartrate (LOPRESSOR) 50 MG tablet Take 1 tablet (50 mg total) by mouth 2 (two) times daily. 08/04/21 11/02/21 Yes Shirlean Mylar, MD  spironolactone (ALDACTONE) 25 MG tablet Take 0.5 tablets (12.5 mg total) by mouth daily. 08/04/21 11/02/21 Yes Shirlean Mylar, MD  albuterol (PROVENTIL HFA;VENTOLIN HFA) 108 (90 Base) MCG/ACT inhaler Inhale 2 puffs into the lungs every 4 (four) hours as needed for wheezing or shortness of breath. Patient not taking: Reported on 04/13/2018 03/29/17 01/21/19  Casey Burkitt, MD    Family History Family History  Problem Relation Age of Onset   Diabetes Mother    Hypertension Mother    Breast cancer Neg Hx     Social History Social History   Tobacco Use   Smoking status: Former    Packs/day: 1.50    Years: 30.00    Total pack years: 45.00    Types: Cigarettes    Quit date: 02/05/2019    Years since quitting: 2.7   Smokeless tobacco: Never   Tobacco comments:    Discussed need to quit for baby, asthma and COPD. Information given  Vaping Use   Vaping Use: Every day   Substances: Nicotine,  THC, Flavoring  Substance Use Topics   Alcohol use: No   Drug use: Not Currently    Types: Marijuana     Allergies   Penicillins   Review of Systems Review of Systems Per HPI  Physical Exam Triage Vital Signs ED Triage Vitals  Enc Vitals Group     BP 10/26/21 1306 102/67     Pulse Rate 10/26/21 1306 67     Resp 10/26/21 1306 16     Temp 10/26/21 1306 98.2 F (36.8 C)     Temp Source 10/26/21 1306 Oral     SpO2 10/26/21 1306 96 %     Weight --      Height --      Head Circumference --      Peak Flow --      Pain Score 10/26/21 1303 0     Pain Loc --      Pain Edu? --      Excl. in GC? --    No data found.  Updated Vital Signs BP 102/67   Pulse 67   Temp 98.2 F (36.8 C) (Oral)   Resp 16   LMP 10/18/2021 (Approximate)   SpO2 96%   Physical Exam Vitals and nursing note reviewed.   Constitutional:      General: She is not in acute distress.    Appearance: Normal appearance.     Comments: Well-appearing  HENT:     Mouth/Throat:     Pharynx: Oropharynx is clear.  Eyes:     Conjunctiva/sclera: Conjunctivae normal.  Cardiovascular:     Rate and Rhythm: Normal rate and regular rhythm.     Pulses: Normal pulses.     Heart sounds: Normal heart sounds.  Pulmonary:     Effort: Pulmonary effort is normal.     Breath sounds: Normal breath sounds.  Skin:    General: Skin is warm and dry.  Neurological:     Mental Status: She is alert and oriented to person, place, and time.     UC Treatments / Results  Labs (all labs ordered are listed, but only abnormal results are displayed) Labs Reviewed  SARS CORONAVIRUS 2 BY RT PCR    EKG  Radiology No results found.  Procedures Procedures   Medications Ordered in UC Medications - No data to display  Initial Impression / Assessment and Plan / UC Course  I have reviewed the triage vital signs and the nursing notes.  Pertinent labs & imaging results that were available during my care of the patient were reviewed by me and considered in my medical decision making (see chart for details).  Patient well-appearing, vitals are stable and she is afebrile.  She would just like a COVID test today and has no other questions or concerns.  Test is pending. If positive, no antivirals. She can return with any symptoms/concerns. Patient agrees to plan  Final Clinical Impressions(s) / UC Diagnoses   Final diagnoses:  Encounter for screening for COVID-19     Discharge Instructions      We will call you if the covid test returns positive.    ED Prescriptions   None    PDMP not reviewed this encounter.   Marlow Baars, [place] 10/26/21 1419

## 2021-10-26 NOTE — Discharge Instructions (Signed)
We will call you if the covid test returns positive.

## 2021-10-29 ENCOUNTER — Other Ambulatory Visit: Payer: Self-pay | Admitting: Family Medicine

## 2021-10-29 ENCOUNTER — Other Ambulatory Visit: Payer: Self-pay

## 2021-10-29 MED ORDER — SPIRONOLACTONE 25 MG PO TABS: 12.5000 mg | ORAL_TABLET | Freq: Every day | ORAL | 0 refills | Status: DC

## 2021-10-29 MED ORDER — METOPROLOL TARTRATE 50 MG PO TABS: 50.0000 mg | ORAL_TABLET | Freq: Two times a day (BID) | ORAL | 0 refills | Status: DC

## 2021-10-29 MED ORDER — HYDRALAZINE HCL 25 MG PO TABS: 25.0000 mg | ORAL_TABLET | Freq: Three times a day (TID) | ORAL | 0 refills | Status: DC

## 2021-10-29 MED ORDER — AMLODIPINE BESYLATE 10 MG PO TABS: 10.0000 mg | ORAL_TABLET | Freq: Every day | ORAL | 0 refills | Status: DC

## 2022-01-04 ENCOUNTER — Other Ambulatory Visit: Payer: Self-pay | Admitting: Family Medicine

## 2022-01-04 ENCOUNTER — Ambulatory Visit (INDEPENDENT_AMBULATORY_CARE_PROVIDER_SITE_OTHER): Payer: Medicaid Other | Admitting: Family Medicine

## 2022-01-04 ENCOUNTER — Encounter: Payer: Self-pay | Admitting: Family Medicine

## 2022-01-04 VITALS — BP 112/62 | HR 84 | Ht 67.0 in | Wt 165.5 lb

## 2022-01-04 DIAGNOSIS — I10 Essential (primary) hypertension: Secondary | ICD-10-CM

## 2022-01-04 MED ORDER — SPIRONOLACTONE 25 MG PO TABS: 12.5000 mg | ORAL_TABLET | Freq: Every day | ORAL | 0 refills | Status: DC

## 2022-01-04 MED ORDER — METOPROLOL TARTRATE 50 MG PO TABS: 50.0000 mg | ORAL_TABLET | Freq: Two times a day (BID) | ORAL | 0 refills | Status: DC

## 2022-01-04 MED ORDER — AMLODIPINE BESYLATE 10 MG PO TABS: 10.0000 mg | ORAL_TABLET | Freq: Every day | ORAL | 0 refills | Status: DC

## 2022-01-04 MED ORDER — METOPROLOL TARTRATE 50 MG PO TABS: 50.0000 mg | ORAL_TABLET | Freq: Two times a day (BID) | ORAL | 1 refills | Status: DC

## 2022-01-04 MED ORDER — AMLODIPINE BESYLATE 10 MG PO TABS: 10.0000 mg | ORAL_TABLET | Freq: Every day | ORAL | 1 refills | Status: DC

## 2022-01-04 MED ORDER — HYDRALAZINE HCL 25 MG PO TABS: 25.0000 mg | ORAL_TABLET | Freq: Three times a day (TID) | ORAL | 0 refills | Status: DC

## 2022-01-04 MED ORDER — SPIRONOLACTONE 25 MG PO TABS: 12.5000 mg | ORAL_TABLET | Freq: Every day | ORAL | 1 refills | Status: DC

## 2022-01-04 NOTE — Assessment & Plan Note (Addendum)
Appears to be well controlled.  However, no home log. 150s/90s today, 112/62 on recheck. No symptoms concerning for significantly low or high BP. Given good control, will continue to hold off on vascular referral for RAS. Will recheck BMP since she has been taking spironolactone.  -provided BP log, encouraged continued monitoring at home, f/u in 4 weeks for recheck

## 2022-01-04 NOTE — Patient Instructions (Addendum)
It was great to see you today! Thank you for choosing [facility] for your primary care. Christine Mueller was seen for blood pressure medication management.  Today we addressed: I have refilled all of your medications, please continue to take them. I will call you if your labwork is abnormal. Please keep a log of your blood pressure. Please follow up in about 4 weeks to recheck your BP and see if we need to change anything. Please bring your log and all medications to each visit  If you haven't already, sign up for My Chart to have easy access to your labs results, and communication with your primary care physician.  We are checking some labs today. If they are abnormal, I will call you. If they are normal, I will send you a MyChart message (if it is active) or a letter in the mail. If you do not hear about your labs in the next 2 weeks, please call the office.   You should return to our clinic Return in about 4 weeks (around 02/01/2022) for BP check.  I recommend that you always bring your medications to each appointment as this makes it easy to ensure you are on the correct medications and helps [place] not miss refills when you need them.  Please arrive 15 minutes before your appointment to ensure smooth check in process.  We appreciate your efforts in making this happen.  Please call the clinic at [phone] if your symptoms worsen or you have any concerns.  Thank you for allowing me to participate in your care, Vonna Drafts, MD 01/04/2022, 10:49 AM PGY-1, [facility]   Blood Pressure Record Sheet To take your blood pressure, you will need a blood pressure machine. You can buy a blood pressure machine (blood pressure monitor) at your clinic, drug store, or online. When choosing one, consider: An automatic monitor that has an arm cuff. A cuff that wraps snugly around your upper arm. You should be able to fit only one finger between your arm and the cuff. A  device that stores blood pressure reading results. Do not choose a monitor that measures your blood pressure from your wrist or finger. Follow your health care provider's instructions for how to take your blood pressure. To use this form: Take your blood pressure medications every day These measurements should be taken when you have been at rest for at least 10-15 min Take at least 2 readings with each blood pressure check. This makes sure the results are correct. Wait 1-2 minutes between measurements. Write down the results in the spaces on this form. Keep in mind it should always be recorded systolic over diastolic. Both numbers are important.  Repeat this every day for 2-3 weeks, or as told by your health care provider.  Make a follow-up appointment with your health care provider to discuss the results.  Blood Pressure Log Date Medications taken? (Y/N) Blood Pressure Time of Day

## 2022-01-04 NOTE — Progress Notes (Signed)
  SUBJECTIVE:   CHIEF COMPLAINT / HPI:   Med refill for HTN Hospital visit in 04/2021 for HTN emergency, discharged on hydralazine, amlodipine, metoprolol tartrate, spironolactone at which time she was also noted to have right renal artery stenosis (no intervention per vascular).  -Has not been regularly checking home BP, but states last time she checked it about a month ago her BP was normal (110s systolic) -Has been out of spironolactone x3 days, also did not take meds this morning because she was running late -Denies dizziness/lightheadedness, syncope, severe headache, vision changes, CP/SOB, abd pain, dysuria or changes in bowel movements   OBJECTIVE:  BP 112/62 Comment: right arm  Pulse 84   Ht 5\' 7"  (1.702 m)   Wt 165 lb 8 oz (75.1 kg)   LMP 12/06/2021   SpO2 100%   BMI 25.92 kg/m   General: NAD, pleasant, able to participate in exam Cardiac: RRR, no murmurs auscultated Respiratory: CTAB, normal WOB Abdomen: soft, non-tender, non-distended, normoactive bowel sounds Extremities: warm and well perfused, no edema or cyanosis Skin: warm and dry, no rashes noted Neuro: alert, no obvious focal deficits, speech normal Psych: Normal affect and mood  ASSESSMENT/PLAN:   Primary hypertension Assessment & Plan: Appears to be well controlled.  However, no home log. 150s/90s today, 112/62 on recheck. No symptoms concerning for significantly low or high BP. Given good control, will continue to hold off on vascular referral for RAS. Will recheck BMP since she has been taking spironolactone.  -provided BP log, encouraged continued monitoring at home, f/u in 4 weeks for recheck  Orders: -     Basic metabolic panel -     amLODIPine Besylate; Take 1 tablet (10 mg total) by mouth daily.  Dispense: 30 tablet; Refill: 1 -     hydrALAZINE HCl; Take 1 tablet (25 mg total) by mouth every 8 (eight) hours.  Dispense: 90 tablet; Refill: 0 -     Metoprolol Tartrate; Take 1 tablet (50 mg total) by  mouth 2 (two) times daily.  Dispense: 60 tablet; Refill: 1 -     Spironolactone; Take 0.5 tablets (12.5 mg total) by mouth daily.  Dispense: 15 tablet; Refill: 1    Return in about 4 weeks (around 02/01/2022) for BP check.

## 2022-01-05 LAB — BASIC METABOLIC PANEL
BUN/Creatinine Ratio: 16 (ref 9–23)
BUN: 14 mg/dL (ref 6–24)
CO2: 18 mmol/L — ABNORMAL LOW (ref 20–29)
Calcium: 9.1 mg/dL (ref 8.7–10.2)
Chloride: 105 mmol/L (ref 96–106)
Creatinine, Ser: 0.88 mg/dL (ref 0.57–1.00)
Glucose: 79 mg/dL (ref 70–99)
Potassium: 4 mmol/L (ref 3.5–5.2)
Sodium: 138 mmol/L (ref 134–144)
eGFR: 83 mL/min/{1.73_m2} (ref >59)

## 2022-04-26 ENCOUNTER — Other Ambulatory Visit: Payer: Self-pay | Admitting: Family Medicine

## 2022-04-26 DIAGNOSIS — I10 Essential (primary) hypertension: Secondary | ICD-10-CM

## 2022-05-01 ENCOUNTER — Other Ambulatory Visit: Payer: Self-pay | Admitting: Family Medicine

## 2022-05-01 DIAGNOSIS — I10 Essential (primary) hypertension: Secondary | ICD-10-CM

## 2022-05-10 ENCOUNTER — Ambulatory Visit (INDEPENDENT_AMBULATORY_CARE_PROVIDER_SITE_OTHER): Payer: Medicaid Other | Admitting: Family Medicine

## 2022-05-10 ENCOUNTER — Encounter: Payer: Self-pay | Admitting: Family Medicine

## 2022-05-10 VITALS — BP 102/68 | HR 73 | Temp 98.8°F | Ht 67.0 in | Wt 175.4 lb

## 2022-05-10 DIAGNOSIS — I10 Essential (primary) hypertension: Secondary | ICD-10-CM

## 2022-05-10 DIAGNOSIS — J069 Acute upper respiratory infection, unspecified: Secondary | ICD-10-CM | POA: Diagnosis not present

## 2022-05-10 MED ORDER — AMLODIPINE BESYLATE 10 MG PO TABS: ORAL_TABLET | ORAL | 3 refills | Status: DC

## 2022-05-10 MED ORDER — HYDRALAZINE HCL 25 MG PO TABS: ORAL_TABLET | ORAL | 3 refills | Status: DC

## 2022-05-10 MED ORDER — SPIRONOLACTONE 25 MG PO TABS: ORAL_TABLET | ORAL | 3 refills | Status: DC

## 2022-05-10 MED ORDER — METOPROLOL TARTRATE 50 MG PO TABS: ORAL_TABLET | ORAL | 3 refills | Status: DC

## 2022-05-10 NOTE — Patient Instructions (Signed)
It was wonderful to see you today.  Please bring ALL of your medications with you to every visit.   Updates from today's visit:  I have refilled your blood pressure medications.  Please continue to keep an eye on your blood pressure at home.  Please follow-up in 3 months-if your blood pressure is stable where it is now, we may go down on one of your medications.  Please continue over-the-counter and home remedies for your viral infection.  This may include OTC medications, honey and warm water, or humidifiers.  Please follow up in 3 months   Thank you for choosing [facility].   Please call [phone] with any questions about today's appointment.  Please be sure to schedule follow up at the front  desk before you leave today.   August Albino, MD  Family Medicine

## 2022-05-10 NOTE — Progress Notes (Signed)
  SUBJECTIVE:   CHIEF COMPLAINT / HPI:   Cough and congestion since Friday 3/15 -No fevers, diarrhea, vomiting, abdominal pain, sinus pain/pressure, no known sick contacts -Denies SOB/CP   Medication refills for HTN - on hydralazine, amlodipine, metoprolol tartrate, spironolactone (hospital visit 04/2021 for HTN emergency) -Home SBP has ranged 121-131 -No dizziness, syncope, severe headaches, vision changes, swelling    PERTINENT  PMH / PSH:   Past Medical History:  Diagnosis Date   Asthma    COPD (chronic obstructive pulmonary disease) ([place])    Essential hypertension 12/23/2015   Gestational diabetes    Hypertension     OBJECTIVE:  BP 102/68   Pulse 73   Temp 98.8 F (37.1 C)   Ht 5\' 7"  (1.702 m)   Wt 175 lb 6.4 oz (79.6 kg)   LMP 04/18/2022   SpO2 98%   BMI 27.47 kg/m   General: NAD, pleasant, able to participate in exam HEENT: No significant erythema/exudate noted in oropharynx.  No significant lymphadenopathy.  Nontender to palpation of maxillary, frontal sinuses. Cardiac: RRR, no murmurs auscultated Respiratory: CTAB, normal WOB Abdomen: soft, non-tender, non-distended, normoactive bowel sounds Extremities: warm and well perfused, no edema or cyanosis Skin: warm and dry, no rashes noted Neuro: alert, no obvious focal deficits, speech normal Psych: Normal affect and mood  ASSESSMENT/PLAN:   1. Viral URI Low suspicion for bacterial/systemic infection given less than 5 days of symptoms, no fevers, nonproductive cough, benign exam.  Reviewed supportive care and return precautions.  2. Primary hypertension Blood pressure is well-controlled, albeit slightly soft in the office today.  Reassuringly, home BP has been in a stable range and has not had any symptoms of hypotension.  Will continue current regimen and follow-up in 3 months, at which point may consider de-escalating her regimen. BMP normal at last visit.  - amLODipine (NORVASC) 10 MG tablet; TAKE 1  TABLET(10 MG) BY MOUTH DAILY  Dispense: 30 tablet; Refill: 3 - hydrALAZINE (APRESOLINE) 25 MG tablet; TAKE 1 TABLET(25 MG) BY MOUTH EVERY 8 HOURS  Dispense: 90 tablet; Refill: 3 - metoprolol tartrate (LOPRESSOR) 50 MG tablet; TAKE 1 TABLET(50 MG) BY MOUTH TWICE DAILY  Dispense: 60 tablet; Refill: 3 - spironolactone (ALDACTONE) 25 MG tablet; TAKE 1/2 TABLET(12.5 MG) BY MOUTH DAILY  Dispense: 15 tablet; Refill: 3   Return in about 3 months (around 08/10/2022) for HTN follow up.  August Albino, MD [facility]

## 2022-06-01 ENCOUNTER — Other Ambulatory Visit: Payer: Self-pay | Admitting: Family Medicine

## 2022-06-01 ENCOUNTER — Encounter: Payer: Self-pay | Admitting: Optometry

## 2022-06-01 DIAGNOSIS — Z0000 Encounter for general adult medical examination without abnormal findings: Secondary | ICD-10-CM

## 2022-06-07 ENCOUNTER — Ambulatory Visit
Admission: RE | Admit: 2022-06-07 | Discharge: 2022-06-07 | Disposition: A | Discharge status: Home / Self Care | Payer: Medicaid Other | Source: Ambulatory Visit | Attending: Family Medicine | Admitting: Family Medicine

## 2022-06-07 ENCOUNTER — Telehealth: Payer: Self-pay | Admitting: Family Medicine

## 2022-06-07 DIAGNOSIS — Z0000 Encounter for general adult medical examination without abnormal findings: Secondary | ICD-10-CM

## 2022-06-07 DIAGNOSIS — Z1231 Encounter for screening mammogram for malignant neoplasm of breast: Secondary | ICD-10-CM | POA: Diagnosis not present

## 2022-06-07 NOTE — Telephone Encounter (Signed)
Patient dropped off FMLA paperwork to be completed. Last DOS was 05/10/22. Placed in [employer].

## 2022-06-08 NOTE — Telephone Encounter (Signed)
Clinical info completed on FMLA form.  Placed form in PCP's box for completion.    When form is completed, please route note to "RN Team" and place in wall pocket in front office.   Tajha Sammarco, CMA  

## 2022-06-10 NOTE — Telephone Encounter (Signed)
Reviewed, completed, and signed form.  Note routed to RN team inbasket and placed completed form in RN Wall pocket in the front office.    Shahad Mazurek, MD  

## 2022-06-10 NOTE — Telephone Encounter (Signed)
Attempted to call patient to inform that paperwork is ready for pick up. She did not answer and VM box was full.   Copy of paperwork made and placed in batch scanning. Original placed at front desk for pick up.   If patient returns call, please advise that paperwork is ready.   I will attempt to call patient at later time.   Veronda Prude, RN

## 2022-06-24 ENCOUNTER — Other Ambulatory Visit: Payer: Self-pay

## 2022-06-24 ENCOUNTER — Other Ambulatory Visit (HOSPITAL_COMMUNITY)
Admission: RE | Admit: 2022-06-24 | Discharge: 2022-06-24 | Disposition: A | Discharge status: Home / Self Care | Payer: Medicaid Other | Source: Ambulatory Visit | Attending: Family Medicine | Admitting: Family Medicine

## 2022-06-24 ENCOUNTER — Ambulatory Visit (INDEPENDENT_AMBULATORY_CARE_PROVIDER_SITE_OTHER): Payer: Medicaid Other | Admitting: Family Medicine

## 2022-06-24 VITALS — BP 94/63 | HR 73 | Ht 63.0 in | Wt 171.2 lb

## 2022-06-24 DIAGNOSIS — B9689 Other specified bacterial agents as the cause of diseases classified elsewhere: Secondary | ICD-10-CM | POA: Diagnosis not present

## 2022-06-24 DIAGNOSIS — N760 Acute vaginitis: Secondary | ICD-10-CM

## 2022-06-24 DIAGNOSIS — Z30011 Encounter for initial prescription of contraceptive pills: Secondary | ICD-10-CM

## 2022-06-24 DIAGNOSIS — A599 Trichomoniasis, unspecified: Secondary | ICD-10-CM

## 2022-06-24 DIAGNOSIS — Z202 Contact with and (suspected) exposure to infections with a predominantly sexual mode of transmission: Secondary | ICD-10-CM | POA: Insufficient documentation

## 2022-06-24 LAB — POCT WET PREP (WET MOUNT): Clue Cells Wet Prep Whiff POC: POSITIVE

## 2022-06-24 LAB — POCT URINE PREGNANCY: Preg Test, Ur: NEGATIVE

## 2022-06-24 MED ORDER — METRONIDAZOLE 500 MG PO TABS
500.0000 mg | ORAL_TABLET | Freq: Two times a day (BID) | ORAL | 0 refills | Status: AC
Start: 2022-06-24 — End: 2022-07-01

## 2022-06-24 MED ORDER — NORETHINDRONE 0.35 MG PO TABS
1.0000 | ORAL_TABLET | Freq: Every day | ORAL | 11 refills | Status: DC
Start: 2022-06-24 — End: 2023-10-07

## 2022-06-24 NOTE — Patient Instructions (Addendum)
It was nice seeing you today!  Take the antibiotics as prescribed.  Avoid sexual intercourse until it has been 7 days after you complete antibiotics.  We recommend that you return for repeat testing for trichomonas in 1 to 3 months.  Check out bedsider.org for more information on birth control.  It is important that you take the birth control pill at the same time every day.  I would recommend using a condom every time to prevent sexually transmitted diseases.  Stay well, Littie Deeds, MD [facility] [phone]  --  Make sure to check out at the front desk before you leave today.  Please arrive at least 15 minutes prior to your scheduled appointments.  If you had blood work today, I will send you a MyChart message or a letter if results are normal. Otherwise, I will give you a call.  If you had a referral placed, they will call you to set up an appointment. Please give [place] a call if you don't hear back in the next 2 weeks.  If you need additional refills before your next appointment, please call your pharmacy first.

## 2022-06-24 NOTE — Progress Notes (Signed)
    SUBJECTIVE:   CHIEF COMPLAINT / HPI:    Patient is here today for trichomonas exposure.  She reports that she was informed by her partner this morning.  Denies any symptoms including vaginal discharge.  She uses condoms for protection. Denies any sexual activity since her last menstrual period which was 4/24.  PERTINENT  PMH / PSH: HTN  Patient Care Team: Vonna Drafts, MD as PCP - General (Family Medicine)   OBJECTIVE:   BP 94/63   Pulse 73   Ht 5\' 3"  (1.6 m)   Wt 171 lb 3.2 oz (77.7 kg)   LMP 06/16/2022   SpO2 98%   BMI 30.33 kg/m   Physical Exam Exam conducted with a chaperone present.  Constitutional:      General: She is not in acute distress. Cardiovascular:     Rate and Rhythm: Normal rate.  Pulmonary:     Effort: Pulmonary effort is normal. No respiratory distress.  Genitourinary:    General: Normal vulva.     Comments: Cervix appears normal.  Scant amount of white vaginal discharge in the vaginal canal. Musculoskeletal:     Cervical back: Neck supple.  Neurological:     Mental Status: She is alert.         06/24/2022    1:44 PM  Depression screen PHQ 2/9  Decreased Interest 0  Down, Depressed, Hopeless 0  PHQ - 2 Score 0  Altered sleeping 0  Tired, decreased energy 2  Change in appetite 0  Feeling bad or failure about yourself  0  Trouble concentrating 0  Moving slowly or fidgety/restless 0  Suicidal thoughts 0  PHQ-9 Score 2     {Show previous vital signs (optional):23777}    ASSESSMENT/PLAN:   1. Trichomonas infection Known trichomonas contact, wet prep positive for trichomonas and BV.  Will treat with metronidazole. - RPR - HIV Antibody (routine testing w rflx) - Cervicovaginal ancillary only - POCT Wet Prep (Wet [place]) - metroNIDAZOLE (FLAGYL) 500 MG tablet; Take 1 tablet (500 mg total) by mouth 2 (two) times daily for 7 days.  Dispense: 14 tablet; Refill: 0 - counseled on condom use - return in 1-3 months for retesting  2.  Encounter for BCP (birth control pills) initial prescription Discussed contraception, patient interested in starting birth control pills.  Will start POP given history of hypertension.  She will think about other contraception options in the meantime. - POCT urine pregnancy - norethindrone (ORTHO MICRONOR) 0.35 MG tablet; Take 1 tablet (0.35 mg total) by mouth daily.  Dispense: 28 tablet; Refill: 11 - contraception resources provided  3. Bacterial vaginosis - metroNIDAZOLE (FLAGYL) 500 MG tablet; Take 1 tablet (500 mg total) by mouth 2 (two) times daily for 7 days.  Dispense: 14 tablet; Refill: 0   Return in about 2 months (around 08/24/2022) for trichomonas retesting.   Littie Deeds, MD [facility]

## 2022-06-25 LAB — CERVICOVAGINAL ANCILLARY ONLY
Chlamydia: NEGATIVE
Comment: NEGATIVE
Comment: NEGATIVE
Comment: NORMAL
Neisseria Gonorrhea: NEGATIVE
Trichomonas: POSITIVE — AB

## 2022-06-25 LAB — HIV ANTIBODY (ROUTINE TESTING W REFLEX): HIV Screen 4th Generation wRfx: NONREACTIVE

## 2022-06-25 LAB — RPR: RPR Ser Ql: NONREACTIVE

## 2022-07-01 ENCOUNTER — Ambulatory Visit: Payer: Medicaid Other | Admitting: Family Medicine

## 2022-07-01 NOTE — Progress Notes (Deleted)
  SUBJECTIVE:   CHIEF COMPLAINT / HPI:   BP f/u -At last visit, was continued on home regimen of amlodipine, hydralazine, Lopressor, Aldactone and was under good control. ***  PERTINENT  PMH / PSH: ***  Past Medical History:  Diagnosis Date   Asthma    COPD (chronic obstructive pulmonary disease) (HCC)    Essential hypertension 12/23/2015   Gestational diabetes    Hypertension     OBJECTIVE:  LMP 06/16/2022   General: NAD, pleasant, able to participate in exam Cardiac: RRR, no murmurs auscultated Respiratory: CTAB, normal WOB Abdomen: soft, non-tender, non-distended, normoactive bowel sounds Extremities: warm and well perfused, no edema or cyanosis Skin: warm and dry, no rashes noted Neuro: alert, no obvious focal deficits, speech normal Psych: Normal affect and mood  ASSESSMENT/PLAN:   There are no diagnoses linked to this encounter. No orders of the defined types were placed in this encounter.  No follow-ups on file.  Vonna Drafts, MD [facility]

## 2022-10-23 ENCOUNTER — Ambulatory Visit
Admission: EM | Admit: 2022-10-23 | Discharge: 2022-10-23 | Disposition: A | Discharge status: Home / Self Care | Payer: BC Managed Care – PPO | Source: Home / Self Care

## 2022-10-23 DIAGNOSIS — U071 COVID-19: Secondary | ICD-10-CM

## 2022-10-23 DIAGNOSIS — J45909 Unspecified asthma, uncomplicated: Secondary | ICD-10-CM

## 2022-10-23 MED ORDER — NIRMATRELVIR/RITONAVIR (PAXLOVID)TABLET: 3.0000 | ORAL_TABLET | Freq: Two times a day (BID) | ORAL | 0 refills | Status: AC

## 2022-10-23 NOTE — ED Triage Notes (Signed)
+ @   home COVID19 test today. "I have sore throat, body aches, runny nose, cough and loose stools". Symptoms started "yesterday".

## 2022-10-23 NOTE — ED Provider Notes (Addendum)
[facility]    CSN: [id number] Arrival date & time: 10/23/22  1324      History   Chief Complaint Chief Complaint  Patient presents with   COVID19    + @ home Test    HPI Christine Mueller is a 47 y.o. female.   Patient here today after testing positive for COVID today.  She reports that symptoms started yesterday.  She has had sore throat, body aches, runny nose, cough and fever.  She reports some loose stools but no vomiting.  She does have history of asthma and states she has had some occasional shortness of breath but has been using her inhaler which has been helpful.  The history is provided by the patient.    Past Medical History:  Diagnosis Date   Asthma    COPD (chronic obstructive pulmonary disease) (HCC)    Essential hypertension 12/23/2015   Gestational diabetes    Hypertension     Patient Active Problem List   Diagnosis Date Noted   Abnormal mammogram of right breast 06/08/2021   Renal artery stenosis (HCC) 05/05/2021   Cannabinoid hyperemesis syndrome 04/30/2021   Hypertensive urgency 04/30/2021   Breast mass, right 05/04/2019   Low TSH level 02/19/2019   Abnormal uterine bleeding (AUB) 08/22/2017   Primary hypertension 04/12/2017   COPD (chronic obstructive pulmonary disease) (HCC) 03/12/2015   Asthma 03/12/2015   Ex-smoker for less than 1 year 03/12/2015    Past Surgical History:  Procedure Laterality Date   CESAREAN SECTION N/A 09/04/2015   Procedure: CESAREAN SECTION;  Surgeon: Lazaro Arms, MD;  Location: [facility] BIR[facility]ING SU[facility];  Service: Obstetrics;  Laterality: N/A;   wisdom teeth removal      OB History     Gravida  4   Para  1   Term  1   Preterm      AB  1   Living  1      SAB  1   IAB      Ectopic      Multiple  0   Live Births  1        Obstetric Comments  Water broke- infant stress- caught in pelvis- nonprogression          Home Medications    Prior to Admission medications   Medication  Sig Start Date End Date Taking? Authorizing Provider  acetaminophen (TYLENOL) 500 MG tablet Take 500 mg by mouth every 6 (six) hours as needed.   Yes [provider]  amLODipine (NORVASC) 10 MG tablet TAKE 1 TABLET(10 MG) BY MOUTH DAILY 05/10/22  Yes Vonna Drafts, MD  hydrALAZINE (APRESOLINE) 25 MG tablet TAKE 1 TABLET(25 MG) BY MOUTH EVERY 8 HOURS 05/10/22  Yes Vonna Drafts, MD  metoprolol tartrate (LOPRESSOR) 50 MG tablet TAKE 1 TABLET(50 MG) BY MOUTH TWICE DAILY 05/10/22  Yes Vonna Drafts, MD  nirmatrelvir/ritonavir (PAXLOVID) 20 x 150 MG & 10 x 100MG  TABS Take 3 tablets by mouth 2 (two) times daily for 5 days. Patient GFR is normal. Take nirmatrelvir (150 mg) two tablets twice daily for 5 days and ritonavir (100 mg) one tablet twice daily for 5 days. 10/23/22 10/28/22 Yes Tomi Bamberger, PA-C  norethindrone (ORTHO MICRONOR) 0.35 MG tablet Take 1 tablet (0.35 mg total) by mouth daily. 06/24/22  Yes Littie Deeds, MD  spironolactone (ALDACTONE) 25 MG tablet TAKE 1/2 TABLET(12.5 MG) BY MOUTH DAILY 05/10/22  Yes Vonna Drafts, MD  albuterol (PROVENTIL HFA;VENTOLIN HFA) 108 (90 Base)  MCG/ACT inhaler Inhale 2 puffs into the lungs every 4 (four) hours as needed for wheezing or shortness of breath. Patient not taking: Reported on 04/13/2018 03/29/17 01/21/19  Casey Burkitt, MD    Family History Family History  Problem Relation Age of Onset   Diabetes Mother    Hypertension Mother    Breast cancer Neg Hx     Social History Social History   Tobacco Use   Smoking status: Former    Current packs/day: 0.00    Average packs/day: 1.5 packs/day for 30.0 years (45.0 ttl pk-yrs)    Types: Cigarettes    Start date: 02/04/1989    Quit date: 02/05/2019    Years since quitting: 3.7   Smokeless tobacco: Never   Tobacco comments:    Discussed need to quit for baby, asthma and COPD. Information given  Vaping Use   Vaping status: Former   Substances: Nicotine, THC, Flavoring  Substance Use  Topics   Alcohol use: No   Drug use: Not Currently    Types: Marijuana     Allergies   Penicillins   Review of Systems Review of Systems  Constitutional:  Positive for chills and fever.  HENT:  Positive for congestion and sore throat. Negative for ear pain.   Eyes:  Negative for discharge and redness.  Respiratory:  Positive for cough and shortness of breath. Negative for wheezing.   Gastrointestinal:  Positive for diarrhea. Negative for abdominal pain, nausea and vomiting.  Musculoskeletal:  Positive for myalgias.     Physical Exam Triage Vital Signs ED Triage Vitals  Encounter Vitals Group     BP 10/23/22 1440 123/82     Systolic BP Percentile --      Diastolic BP Percentile --      Pulse Rate 10/23/22 1440 74     Resp 10/23/22 1440 18     Temp 10/23/22 1440 99.9 F (37.7 C)     Temp Source 10/23/22 1440 Oral     SpO2 10/23/22 1440 96 %     Weight 10/23/22 1438 165 lb (74.8 kg)     Height 10/23/22 1438 5\' 3"  (1.6 m)     Head Circumference --      Peak Flow --      Pain Score 10/23/22 1436 0     Pain Loc --      Pain Education --      Exclude from Growth Chart --    No data found.  Updated Vital Signs BP 123/82 (BP Location: Left Arm)   Pulse 74   Temp 99.9 F (37.7 C) (Oral)   Resp 18   Ht 5\' 3"  (1.6 m)   Wt 165 lb (74.8 kg)   LMP 10/15/2022 (Exact Date)   SpO2 96%   BMI 29.23 kg/m      Physical Exam Vitals and nursing note reviewed.  Constitutional:      General: She is not in acute distress.    Appearance: Normal appearance. She is not ill-appearing.  HENT:     Head: Normocephalic and atraumatic.     Nose: Congestion present.     Mouth/Throat:     Mouth: Mucous membranes are moist.     Pharynx: No oropharyngeal exudate or posterior oropharyngeal erythema.  Eyes:     Conjunctiva/sclera: Conjunctivae normal.  Cardiovascular:     Rate and Rhythm: Normal rate and regular rhythm.     Heart sounds: Normal heart sounds. No murmur  heard. Pulmonary:  Effort: Pulmonary effort is normal. No respiratory distress.     Breath sounds: Normal breath sounds. No wheezing, rhonchi or rales.  Skin:    General: Skin is warm and dry.  Neurological:     Mental Status: She is alert.  Psychiatric:        Mood and Affect: Mood normal.        Thought Content: Thought content normal.      UC Treatments / Results  Labs (all labs ordered are listed, but only abnormal results are displayed) Labs Reviewed - No data to display  EKG   Radiology No results found.  Procedures Procedures (including critical care time)  Medications Ordered in UC Medications - No data to display  Initial Impression / Assessment and Plan / UC Course  I have reviewed the triage vital signs and the nursing notes.  Pertinent labs & imaging results that were available during my care of the patient were reviewed by me and considered in my medical decision making (see chart for details).   Patient prefers Paxlovid prescribed.  Recommended albuterol if needed.  Otherwise encouraged symptomatic treatment, increase fluids and rest with follow-up if no gradual improvement with any worsening.  Work note provided as requested.   Final Clinical Impressions(s) / UC Diagnoses   Final diagnoses:  COVID  Uncomplicated asthma, unspecified asthma severity, unspecified whether persistent   Discharge Instructions   None    ED Prescriptions     Medication Sig Dispense Auth. Provider   nirmatrelvir/ritonavir (PAXLOVID) 20 x 150 MG & 10 x 100MG  TABS Take 3 tablets by mouth 2 (two) times daily for 5 days. Patient GFR is normal. Take nirmatrelvir (150 mg) two tablets twice daily for 5 days and ritonavir (100 mg) one tablet twice daily for 5 days. 30 tablet Tomi Bamberger, PA-C      PDMP not reviewed this encounter.   Tomi Bamberger, PA-C 10/23/22 1502    Tomi Bamberger, PA-C 10/23/22 [phone]

## 2022-10-25 NOTE — Progress Notes (Unsigned)
  SUBJECTIVE:   CHIEF COMPLAINT / HPI:   HTN follow-up At last visit, continued on current regimen (hydralazine, amlodipine, metoprolol tartrate, spironolactone).  Consider de-escalating regimen if stable BP has been normal at home  Patient denies dizziness, lightheadedness, headaches, vision changes, chest pain, shortness of breath  She was recently diagnosed with COVID and is on Paxlovid for this.  No recent fevers, feeling well   PERTINENT  PMH / PSH:   Past Medical History:  Diagnosis Date   Asthma    COPD (chronic obstructive pulmonary disease) (HCC)    Essential hypertension 12/23/2015   Gestational diabetes    Hypertension     OBJECTIVE:  BP 108/72   Pulse 77   Ht 5\' 3"  (1.6 m)   Wt 169 lb 6.4 oz (76.8 kg)   LMP 10/15/2022 (Exact Date)   SpO2 100%   BMI 30.01 kg/m   General: NAD, pleasant, able to participate in exam Cardiac: RRR, no murmurs auscultated Respiratory: CTAB, normal WOB Abdomen: soft, non-tender, non-distended, normoactive bowel sounds Extremities: warm and well perfused, no edema or cyanosis Skin: warm and dry, no rashes noted Neuro: alert, no obvious focal deficits, speech normal Psych: Normal affect and mood  ASSESSMENT/PLAN:   1. Primary hypertension She has been stable on her existing regimen for multiple visits now.  Will start tapering her medications with hopes of decreasing her pill burden.  Decrease hydralazine to 10 mg every 8 hours.  Follow-up in about 2 weeks, at which time can likely come off of hydralazine completely.  Check BMP today.  Refilled metoprolol and spironolactone.  Provided return precautions for hypo-/hypertension - Basic Metabolic Panel - hydrALAZINE (APRESOLINE) 10 MG tablet; Take 1 tablet (10 mg total) by mouth 3 (three) times daily.  Dispense: 60 tablet; Refill: 0 - metoprolol tartrate (LOPRESSOR) 50 MG tablet; TAKE 1 TABLET(50 MG) BY MOUTH TWICE DAILY  Dispense: 60 tablet; Refill: 3 - spironolactone (ALDACTONE) 25  MG tablet; TAKE 1/2 TABLET(12.5 MG) BY MOUTH DAILY  Dispense: 15 tablet; Refill: 3  Return in about 13 days (around 11/08/2022).  Vonna Drafts, MD [facility]

## 2022-10-26 ENCOUNTER — Encounter: Payer: Self-pay | Admitting: Family Medicine

## 2022-10-26 ENCOUNTER — Ambulatory Visit (INDEPENDENT_AMBULATORY_CARE_PROVIDER_SITE_OTHER): Payer: BC Managed Care – PPO | Admitting: Family Medicine

## 2022-10-26 VITALS — BP 108/72 | HR 77 | Ht 63.0 in | Wt 169.4 lb

## 2022-10-26 DIAGNOSIS — I10 Essential (primary) hypertension: Secondary | ICD-10-CM | POA: Diagnosis not present

## 2022-10-26 MED ORDER — SPIRONOLACTONE 25 MG PO TABS
ORAL_TABLET | ORAL | 3 refills | Status: DC
Start: 2022-10-26 — End: 2023-06-23

## 2022-10-26 MED ORDER — METOPROLOL TARTRATE 50 MG PO TABS: ORAL_TABLET | ORAL | 3 refills | Status: DC

## 2022-10-26 MED ORDER — HYDRALAZINE HCL 10 MG PO TABS
10.0000 mg | ORAL_TABLET | Freq: Three times a day (TID) | ORAL | 0 refills | Status: DC
Start: 2022-10-26 — End: 2023-06-23

## 2022-10-26 NOTE — Patient Instructions (Addendum)
Please take hydralazine 10 mg every 8 hours  Please follow-up on Monday, September 16 at 3:35 PM  Please continue to check your blood pressures at home.  If you start seeing significant increases please let our office know  All you know if any of your lab results from today are abnormal.  Otherwise I will send a message on MyChart  I have sent in refills of your spironolactone and metoprolol

## 2022-10-27 LAB — BASIC METABOLIC PANEL
BUN/Creatinine Ratio: 15 (ref 9–23)
BUN: 13 mg/dL (ref 6–24)
CO2: 20 mmol/L (ref 20–29)
Calcium: 9.5 mg/dL (ref 8.7–10.2)
Chloride: 104 mmol/L (ref 96–106)
Creatinine, Ser: 0.85 mg/dL (ref 0.57–1.00)
Glucose: 80 mg/dL (ref 70–99)
Potassium: 4.4 mmol/L (ref 3.5–5.2)
Sodium: 137 mmol/L (ref 134–144)
eGFR: 86 mL/min/{1.73_m2} (ref >59)

## 2022-10-28 ENCOUNTER — Encounter: Payer: Self-pay | Admitting: Family Medicine

## 2022-11-08 ENCOUNTER — Ambulatory Visit: Payer: BC Managed Care – PPO | Admitting: Family Medicine

## 2022-11-08 NOTE — Progress Notes (Deleted)
    SUBJECTIVE:   CHIEF COMPLAINT / HPI:   BP f/u - at last visit her hydralazine was decreased to 10 mg every 8 hours Otherwise continues on amlodipine, metoprolol tartrate, spironolactone ***  PERTINENT  PMH / PSH: ***  OBJECTIVE:   LMP 10/15/2022 (Exact Date)   ***  ASSESSMENT/PLAN:   No problem-specific Assessment & Plan notes found for this encounter.     Vonna Drafts, MD [facility]

## 2022-12-15 ENCOUNTER — Telehealth: Payer: Self-pay

## 2022-12-15 NOTE — Telephone Encounter (Signed)
Received phone call from patient's mother regarding lisinopril refill.   Advised that this medication is not on current medication list and was not part of her current medication regimen per last note from Dr. Barb Merino.   Mother reports that patient has been getting headaches over the last several days. Denies visual changes, chest pain or shortness of breath.   Advised scheduling BP follow up with PCP, as last visit was no showed. Scheduled with PCP on 12/27/22.  Scheduled for tomorrow in [facility] for further evaluation of headaches.   Veronda Prude, RN

## 2022-12-16 ENCOUNTER — Ambulatory Visit: Payer: BC Managed Care – PPO | Admitting: Family Medicine

## 2022-12-16 ENCOUNTER — Other Ambulatory Visit: Payer: Self-pay

## 2022-12-16 VITALS — BP 164/109 | HR 80 | Ht 63.0 in | Wt 167.2 lb

## 2022-12-16 DIAGNOSIS — Z23 Encounter for immunization: Secondary | ICD-10-CM | POA: Diagnosis not present

## 2022-12-16 DIAGNOSIS — G44229 Chronic tension-type headache, not intractable: Secondary | ICD-10-CM | POA: Diagnosis not present

## 2022-12-16 DIAGNOSIS — I10 Essential (primary) hypertension: Secondary | ICD-10-CM

## 2022-12-16 MED ORDER — AMLODIPINE BESYLATE 10 MG PO TABS
ORAL_TABLET | ORAL | 3 refills | Status: DC
Start: 2022-12-16 — End: 2024-01-02

## 2022-12-16 NOTE — Patient Instructions (Signed)
Good to see you today - Thank you for coming in  Things we discussed today:  1) For your blood pressure - Continue taking amlodipine 10mg  daily. This is the medication you forgot - Continue taking hydralazine, metoprolol, and spironolactone as directed - Come back in 1-2 weeks to recheck your blood pressure - In the future, we may try to transition off the hydralazine to a different medication.  2) For your headaches, - Continue taking Tylenol as needed for pain  Please seek further medical attention if you: - have a headache that does not go away - have blurry vision - start becoming confused or disoriented - have sudden weakness in your body - have trouble breathing - have chest pain  Please always bring your medication bottles  Come back to see me in 1-2 weeks

## 2022-12-16 NOTE — Progress Notes (Signed)
    SUBJECTIVE:   CHIEF COMPLAINT / HPI:   DB is a 47yo F w/ hx of HA's, HTN, COPD that p/f headaches.  Headaches - has chronic headaches involving BL temples. - Take tylenol for headaches an it helps - Has been getting HA's more frequently recently, thinks it is related to stress at work. - Gets about one headache a day at work. - These headaches feel like her usual headaches. - Denies vision changes, N/V  HTN - Taking metoprolol, spironolactone, and hydralazine - Has not been taking amlodipine. She forgot about this medication and could not remember which she was missing.  - Thinks she was started on hydralazine when admitted in the hospital in the past and had high BP.  - Reports missing some doses of her BP meds. Also ran out of meds for 1 week last week and forgot to pick it up.  - Last took meds this morning, meds were refilled Monday.  OBJECTIVE:   There were no vitals taken for this visit.  General: Alert, pleasant woman. Responding appropriately in full sentences. Follows commands. NAD. HEENT: NCAT. MMM. PERRLA CV: RRR, no murmurs. Resp: CTAB, no wheezing or crackles. Normal WOB on RA.  Abm: Soft, nontender, nondistended. BS present. Ext: Moves all ext spontaneously Skin: Warm, well perfused   ASSESSMENT/PLAN:   Assessment & Plan Primary hypertension BP elevated, likely because she has forgotten to take her amlodipine. Was previously well controlled at 10/26/22 visit when she was taking amlodipine, metop, hydral, and spiro. Looks like pt was started on hydral at some point in the past while admitted in hospital. Does not look like pt has any contraindication to ACE/Arbs. Discussed potentially switching off hydral and adding arb in future and pt is agreeable.  - Restart amlodipine 10mg  daily - Continue metoprolol, spironolactone, and hydralazine. - Advised to f/u in 1-2 weeks. If normotensive at that time, consider weaning off hydral and adding ARB (losartan) -  Discussed ED precautions for hypertensive emergency Encounter for immunization  Chronic tension-type headache, not intractable Reports recent increased frequency of her chronic headaches. HA's involve BL temples and can last for days, improves w/ tylenol. This is most likely related to increased stress at work recently. - Continue tylenol prn for headaches   Lincoln Brigham, MD [facility]

## 2022-12-17 DIAGNOSIS — G44229 Chronic tension-type headache, not intractable: Secondary | ICD-10-CM | POA: Insufficient documentation

## 2022-12-17 NOTE — Assessment & Plan Note (Signed)
BP elevated, likely because she has forgotten to take her amlodipine. Was previously well controlled at 10/26/22 visit when she was taking amlodipine, metop, hydral, and spiro. Looks like pt was started on hydral at some point in the past while admitted in hospital. Does not look like pt has any contraindication to ACE/Arbs. Discussed potentially switching off hydral and adding arb in future and pt is agreeable.  - Restart amlodipine 10mg  daily - Continue metoprolol, spironolactone, and hydralazine. - Advised to f/u in 1-2 weeks. If normotensive at that time, consider weaning off hydral and adding ARB (losartan) - Discussed ED precautions for hypertensive emergency

## 2022-12-17 NOTE — Assessment & Plan Note (Signed)
Reports recent increased frequency of her chronic headaches. HA's involve BL temples and can last for days, improves w/ tylenol. This is most likely related to increased stress at work recently. - Continue tylenol prn for headaches

## 2022-12-20 ENCOUNTER — Other Ambulatory Visit: Payer: Self-pay

## 2022-12-22 ENCOUNTER — Telehealth: Payer: Self-pay

## 2022-12-22 NOTE — Telephone Encounter (Signed)
Elnita Maxwell- patient's mother calls nurse line regarding BP medications.   She reports that patient was of the understanding that she should still be taking lisinopril.   Advised that per note, patient was advised to restart amlodipine and to continue metoprolol, spironolactone and hydralazine.  Will forward to provider for further clarification.  If patient is supposed to be taking lisinopril, she will need new prescription sent to her pharmacy. Thanks.    Veronda Prude, RN

## 2022-12-24 NOTE — Telephone Encounter (Signed)
Attempted to return call to patient.   Unable to reach, patient VM box full.   Will attempt to reach patient at later time.   Veronda Prude, RN

## 2022-12-27 ENCOUNTER — Ambulatory Visit: Payer: BC Managed Care – PPO | Admitting: Family Medicine

## 2022-12-30 NOTE — Progress Notes (Deleted)
    SUBJECTIVE:   CHIEF COMPLAINT / HPI:   F/u for HTN At last visit 10/24 was restarted on amlodipine 10mg  daily and continued on metoprolol, spironolactone, and hydralazine.  Consider weaning off hydralazine and replacing with losartan***  ***  PERTINENT  PMH / PSH: ***  OBJECTIVE:   There were no vitals taken for this visit.  ***  ASSESSMENT/PLAN:   Assessment & Plan    Christine Drafts, MD [facility]

## 2022-12-31 ENCOUNTER — Ambulatory Visit: Payer: Self-pay | Admitting: Family Medicine

## 2023-02-28 ENCOUNTER — Telehealth: Payer: Self-pay | Admitting: Family Medicine

## 2023-02-28 NOTE — Telephone Encounter (Signed)
 Patient dropped off form at front desk for [facility].  Verified that patient section of form has been completed.  Last DOS/WCC with PCP was 12/16/22.  Placed form in blue team folder to be completed by clinical staff.  Vilinda Blanks

## 2023-03-01 NOTE — Telephone Encounter (Signed)
Placed some forms in your box that the patient needs completed.

## 2023-03-08 NOTE — Telephone Encounter (Signed)
Reviewed, completed, and signed form.  Note routed to RN team inbasket and placed completed form in RN Wall pocket in the front office.    Eathan Groman, MD  

## 2023-03-10 NOTE — Telephone Encounter (Signed)
FO informed patient the form is ready for pick up.   Form placed up front for pick up.   Copy made for batch scanning.

## 2023-03-22 ENCOUNTER — Ambulatory Visit: Payer: BC Managed Care – PPO | Admitting: Student

## 2023-03-22 VITALS — BP 128/80 | HR 79 | Temp 98.7°F | Ht 63.0 in | Wt 163.8 lb

## 2023-03-22 DIAGNOSIS — J069 Acute upper respiratory infection, unspecified: Secondary | ICD-10-CM

## 2023-03-22 DIAGNOSIS — R0602 Shortness of breath: Secondary | ICD-10-CM | POA: Diagnosis not present

## 2023-03-22 MED ORDER — ALBUTEROL SULFATE HFA 108 (90 BASE) MCG/ACT IN AERS
2.0000 | INHALATION_SPRAY | Freq: Four times a day (QID) | RESPIRATORY_TRACT | 0 refills | Status: AC | PRN
Start: 2023-03-22 — End: ?

## 2023-03-22 NOTE — Patient Instructions (Signed)
It was great to see you! Thank you for allowing me to participate in your care!   Our plans for today:  - You likely have the flu. See attached information on supportive care and when to seek further medication attention -albuterol inhaler sent to pharmacy to use as needed for shortness of breath or wheezing. If those symptoms worsen, please return or go to the ED   Take care and seek immediate care sooner if you develop any concerns.   Dr. Erick Alley, DO [facility]

## 2023-03-22 NOTE — Progress Notes (Signed)
    SUBJECTIVE:   CHIEF COMPLAINT / HPI:   Sick symptoms For past 4 days with body aches, headache, fever with T max 101, cough, congestion. No vomiting, decreased po intake or diarrhea. Notes SOB/wheezing/chest tightness at night.  Currently not having SOB.  Denies chest pain.  Son recently tested positive for the flu.  PERTINENT  PMH / PSH: COPD, asthma  OBJECTIVE:   BP 128/80   Pulse 79   Temp 98.7 F (37.1 C)   Ht 5\' 3"  (1.6 m)   Wt 163 lb 12.8 oz (74.3 kg)   LMP 03/09/2023   SpO2 99%   BMI 29.02 kg/m    General: NAD, pleasant, overall well-appearing  HEENT: White sclera, clear conjunctiva, MMM, no edema, erythema or exudate of oropharynx Cardiac: RRR, no murmurs. Respiratory: Air movement heard but slightly diminished throughout, breathing comfortably, no wheezes rales or rhonchi Skin: warm and dry Neuro: alert, no obvious focal deficits Psych: Normal affect and mood  ASSESSMENT/PLAN:   Viral upper respiratory tract infection Symptoms most consistent with viral URI.  Likely has flu given her son tested positive recently. Less concern for pneumonia given no focal lung sounds. I am concerned about her shortness of breath/wheezing and chest tightness she experiences at night considering her PMH significant for COPD and asthma.  Reassuringly, SpO2 is normal and she is breathing comfortably on room air. -Provided handout on the flu including supportive care and return precautions which were discussed -Rx albuterol to use as needed for wheezing and shortness of breath while she is sick     Dr. Erick Alley, DO [facility]

## 2023-03-22 NOTE — Assessment & Plan Note (Signed)
Symptoms most consistent with viral URI.  Likely has flu given her son tested positive recently. Less concern for pneumonia given no focal lung sounds. I am concerned about her shortness of breath/wheezing and chest tightness she experiences at night considering her PMH significant for COPD and asthma.  Reassuringly, SpO2 is normal and she is breathing comfortably on room air. -Provided handout on the flu including supportive care and return precautions which were discussed -Rx albuterol to use as needed for wheezing and shortness of breath while she is sick

## 2023-05-13 ENCOUNTER — Other Ambulatory Visit: Payer: Self-pay | Admitting: Family Medicine

## 2023-05-13 ENCOUNTER — Encounter: Payer: Self-pay | Admitting: Family Medicine

## 2023-05-13 DIAGNOSIS — Z1231 Encounter for screening mammogram for malignant neoplasm of breast: Secondary | ICD-10-CM

## 2023-06-06 ENCOUNTER — Telehealth: Payer: Self-pay | Admitting: Family Medicine

## 2023-06-06 NOTE — Telephone Encounter (Signed)
 Patient dropped off FMLA paperwork to be completed. Last DOS was 03/22/23. Placed in [employer].

## 2023-06-06 NOTE — Telephone Encounter (Signed)
Clinical info completed on FMLA form.  Placed form in PCP's box for completion.    When form is completed, please route note to "RN Team" and place in wall pocket in front office.   Salvatore Marvel, CMA

## 2023-06-07 NOTE — Telephone Encounter (Signed)
 Patient called, LVM and informed that forms are ready for pick up. Copy made and placed in batch scanning. Original placed at front desk for pick up.   Veronda Prude, RN

## 2023-06-09 ENCOUNTER — Ambulatory Visit
Admission: RE | Admit: 2023-06-09 | Discharge: 2023-06-09 | Disposition: A | Discharge status: Home / Self Care | Source: Ambulatory Visit | Attending: Family Medicine | Admitting: Family Medicine

## 2023-06-09 DIAGNOSIS — Z1231 Encounter for screening mammogram for malignant neoplasm of breast: Secondary | ICD-10-CM

## 2023-06-23 ENCOUNTER — Other Ambulatory Visit: Payer: Self-pay | Admitting: Family Medicine

## 2023-06-23 ENCOUNTER — Ambulatory Visit (INDEPENDENT_AMBULATORY_CARE_PROVIDER_SITE_OTHER): Admitting: Family Medicine

## 2023-06-23 ENCOUNTER — Encounter: Payer: Self-pay | Admitting: Family Medicine

## 2023-06-23 VITALS — BP 149/97 | HR 68 | Ht 63.0 in | Wt 161.8 lb

## 2023-06-23 DIAGNOSIS — I10 Essential (primary) hypertension: Secondary | ICD-10-CM

## 2023-06-23 DIAGNOSIS — Z131 Encounter for screening for diabetes mellitus: Secondary | ICD-10-CM | POA: Diagnosis not present

## 2023-06-23 DIAGNOSIS — Z1211 Encounter for screening for malignant neoplasm of colon: Secondary | ICD-10-CM

## 2023-06-23 LAB — POCT GLYCOSYLATED HEMOGLOBIN (HGB A1C): Hemoglobin A1C: 5 % (ref 4.0–5.6)

## 2023-06-23 NOTE — Progress Notes (Signed)
    SUBJECTIVE:   CHIEF COMPLAINT / HPI:   HTN  On amlodipine , hydralazine , lopressor , spironolactone  At last visit hydral was decreased  Requesting thyroid  test today, last TSH wnl in 2023 Asymptomatic, no HA/vision changes, CP/SOB, abd pain Has not taken meds today Reports home BP runs 120s/70s  Due for colonoscopy, requesting referral  PERTINENT  PMH / PSH: HTN  OBJECTIVE:   BP (!) 149/97   Pulse 68   Ht 5\' 3"  (1.6 m)   Wt 161 lb 12.8 oz (73.4 kg)   LMP 06/15/2023   SpO2 99%   BMI 28.66 kg/m   General: NAD, pleasant, able to participate in exam Cardiac: RRR, no murmurs auscultated Respiratory: CTAB, normal WOB Abdomen: soft, non-tender, non-distended, normoactive bowel sounds Extremities: warm and well perfused, no edema or cyanosis Skin: warm and dry, no rashes noted Neuro: alert, no obvious focal deficits, speech normal Psych: Normal affect and mood  ASSESSMENT/PLAN:   Assessment & Plan Primary hypertension Overall well controlled, no meds taken today so slightly above goal in office today but reports normal home BP readings DC hydral today F/u 1-2 weeks If elevated consider ARB TSH Screening for diabetes mellitus (DM) A1c normal today Screening for colon cancer GI referral colonoscopy    Edison Gore, MD [facility]

## 2023-06-23 NOTE — Assessment & Plan Note (Addendum)
 Overall well controlled, no meds taken today so slightly above goal in office today but reports normal home BP readings DC hydral today F/u 1-2 weeks If elevated consider ARB TSH

## 2023-06-23 NOTE — Patient Instructions (Signed)
 Stop taking hydralazine  Continue all your other medications  Monitor BP at home - if above 140/90 consistently, let me know  Otherwise please follow up in 1-2 weeks to make sure BP stays normal

## 2023-07-06 ENCOUNTER — Telehealth: Payer: Self-pay

## 2023-07-06 NOTE — Telephone Encounter (Signed)
 Received call from patient and mother regarding spironolactone  prescription. They report being told by [facility] that we had transferred her prescription to [facility].   Called pharmacy. They report that patient picked up 15 tablets of Spironolactone  on 06/26/23 with a discount card. Per pharmacist, patient's insurance is contracted with [facility] and prescriptions will be covered at [facility].   Attempted to return call to patient and patient's mother. They did not answer, LVM requesting that they return call to office for update.   Elsie Halo, RN

## 2023-07-19 ENCOUNTER — Ambulatory Visit: Admitting: Family Medicine

## 2023-08-25 ENCOUNTER — Ambulatory Visit (AMBULATORY_SURGERY_CENTER)

## 2023-08-25 VITALS — Ht 63.0 in | Wt 160.0 lb

## 2023-08-25 DIAGNOSIS — Z1211 Encounter for screening for malignant neoplasm of colon: Secondary | ICD-10-CM

## 2023-08-25 MED ORDER — PEG 3350-KCL-NA BICARB-NACL 420 G PO SOLR: 4000.0000 mL | Freq: Once | ORAL | 0 refills | Status: AC

## 2023-08-25 NOTE — Progress Notes (Signed)
 No egg or soy allergy known to patient  No issues known to pt with past sedation with any surgeries or procedures  Patient denies ever being told they had issues or difficulty with intubation  No FH of Malignant Hyperthermia- Pt has never had general anesthesia  Pt is not on diet pills Pt is not on  home 02  Pt is not on blood thinners  Pt denies issues with constipation  No A fib or A flutter Have any cardiac testing pending--No Pt can ambulate  Pt denies use of chewing tobacco Discussed diabetic I weight loss medication holds Discussed NSAID holds Checked BMI Pt instructed to use Singlecare.com or GoodRx for a price reduction on prep  Patient's chart reviewed by Norleen Schillings CNRA prior to previsit and patient appropriate for the [facility].  Pre visit completed and red dot placed by patient's name on their procedure day (on provider's schedule).

## 2023-09-19 ENCOUNTER — Telehealth: Payer: Self-pay | Admitting: Family Medicine

## 2023-09-19 NOTE — Telephone Encounter (Signed)
 Patient dropped off form at front desk for [facility].  Verified that patient section of form has been completed.  Last DOS/WCC with PCP was 06/23/2023.  Placed form in blue folder team folder to be completed by clinical staff.  [name]   Thank you!

## 2023-09-19 NOTE — Telephone Encounter (Signed)
 Placed a form in your box that needs to be completed.

## 2023-09-22 ENCOUNTER — Ambulatory Visit: Admitting: Internal Medicine

## 2023-09-22 ENCOUNTER — Encounter: Payer: Self-pay | Admitting: Internal Medicine

## 2023-09-22 VITALS — BP 156/92 | HR 88 | Temp 98.2°F | Resp 12 | Ht 63.0 in | Wt 160.0 lb

## 2023-09-22 DIAGNOSIS — Z1211 Encounter for screening for malignant neoplasm of colon: Secondary | ICD-10-CM

## 2023-09-22 DIAGNOSIS — D123 Benign neoplasm of transverse colon: Secondary | ICD-10-CM | POA: Diagnosis not present

## 2023-09-22 DIAGNOSIS — K648 Other hemorrhoids: Secondary | ICD-10-CM | POA: Diagnosis not present

## 2023-09-22 MED ORDER — SODIUM CHLORIDE 0.9 % IV SOLN: 500.0000 mL | Freq: Once | INTRAVENOUS | Status: DC

## 2023-09-22 NOTE — Progress Notes (Signed)
 Called to room to assist during endoscopic procedure.  Patient ID and intended procedure confirmed with present staff. Received instructions for my participation in the procedure from the performing physician.

## 2023-09-22 NOTE — Progress Notes (Signed)
 GASTROENTEROLOGY PROCEDURE H&P NOTE   Primary Care Physician: Romelle Booty, MD    Reason for Procedure:   Colon cancer screening  Plan:    Colonoscopy  Patient is appropriate for endoscopic procedure(s) in the ambulatory ([facility]) setting.  The nature of the procedure, as well as the risks, benefits, and alternatives were carefully and thoroughly reviewed with the patient. Ample time for discussion and questions allowed. The patient understood, was satisfied, and agreed to proceed.     HPI: Christine Mueller is a 48 y.o. female who presents for colonoscopy for colon cancer screening. Denies blood in stools, changes in bowel habits, or unintentional weight loss. Denies family history of colon cancer.  Past Medical History:  Diagnosis Date   Asthma    COPD (chronic obstructive pulmonary disease) (HCC)    Essential hypertension 12/23/2015   Gestational diabetes    Hypertension     Past Surgical History:  Procedure Laterality Date   CESAREAN SECTION N/A 09/04/2015   Procedure: CESAREAN SECTION;  Surgeon: Vonn VEAR Inch, MD;  Location: [facility] BIRTHING SUITES;  Service: Obstetrics;  Laterality: N/A;   wisdom teeth removal      Prior to Admission medications   Medication Sig Start Date End Date Taking? Authorizing Provider  amLODipine  (NORVASC ) 10 MG tablet TAKE 1 TABLET(10 MG) BY MOUTH DAILY 12/16/22  Yes Elicia Hamlet, MD  spironolactone  (ALDACTONE ) 25 MG tablet TAKE 1/2 TABLET(12.5 MG) BY MOUTH DAILY 06/23/23  Yes Romelle Booty, MD  acetaminophen  (TYLENOL ) 500 MG tablet Take 500 mg by mouth every 6 (six) hours as needed.    [provider]  albuterol  (VENTOLIN  HFA) 108 (90 Base) MCG/ACT inhaler Inhale 2 puffs into the lungs every 6 (six) hours as needed for wheezing or shortness of breath. 03/22/23   Joshua Domino, DO  metoprolol  tartrate (LOPRESSOR ) 50 MG tablet TAKE 1 TABLET(50 MG) BY MOUTH TWICE DAILY 10/26/22   Romelle Booty, MD  norethindrone  (ORTHO MICRONOR ) 0.35 MG tablet  Take 1 tablet (0.35 mg total) by mouth daily. 06/24/22   Austin Ade, MD    Current Outpatient Medications  Medication Sig Dispense Refill   amLODipine  (NORVASC ) 10 MG tablet TAKE 1 TABLET(10 MG) BY MOUTH DAILY 90 tablet 3   spironolactone  (ALDACTONE ) 25 MG tablet TAKE 1/2 TABLET(12.5 MG) BY MOUTH DAILY 15 tablet 3   acetaminophen  (TYLENOL ) 500 MG tablet Take 500 mg by mouth every 6 (six) hours as needed.     albuterol  (VENTOLIN  HFA) 108 (90 Base) MCG/ACT inhaler Inhale 2 puffs into the lungs every 6 (six) hours as needed for wheezing or shortness of breath. 6.7 g 0   metoprolol  tartrate (LOPRESSOR ) 50 MG tablet TAKE 1 TABLET(50 MG) BY MOUTH TWICE DAILY 60 tablet 3   norethindrone  (ORTHO MICRONOR ) 0.35 MG tablet Take 1 tablet (0.35 mg total) by mouth daily. 28 tablet 11   Current Facility-Administered Medications  Medication Dose Route Frequency Provider Last Rate Last Admin   0.9 %  sodium chloride  infusion  500 mL Intravenous Once Dequita Schleicher C, MD        Allergies as of 09/22/2023 - Review Complete 09/22/2023  Allergen Reaction Noted   Penicillins Swelling, Rash, and Other (See Comments) 01/25/2011    Family History  Problem Relation Age of Onset   Diabetes Mother    Hypertension Mother    Breast cancer Neg Hx    Colon cancer Neg Hx    Rectal cancer Neg Hx    Stomach cancer Neg Hx  Esophageal cancer Neg Hx     Social History   Socioeconomic History   Marital status: Single    Spouse name: Not on file   Number of children: Not on file   Years of education: Not on file   Highest education level: Not on file  Occupational History   Not on file  Tobacco Use   Smoking status: Former    Current packs/day: 0.00    Average packs/day: 1.5 packs/day for 30.0 years (45.0 ttl pk-yrs)    Types: Cigarettes    Start date: 02/04/1989    Quit date: 02/05/2019    Years since quitting: 4.6   Smokeless tobacco: Never   Tobacco comments:    Discussed need to quit for baby, asthma  and COPD. Information given  Vaping Use   Vaping status: Every Day   Substances: Nicotine , THC, Flavoring  Substance and Sexual Activity   Alcohol use: No   Drug use: Not Currently    Types: Marijuana   Sexual activity: Not on file  Other Topics Concern   Not on file  Social History Narrative   Not on file   Social Drivers of Health   Financial Resource Strain: Not on file  Food Insecurity: Not on file  Transportation Needs: Not on file  Physical Activity: Not on file  Stress: Not on file  Social Connections: Unknown (06/22/2021)   Received from [facility]   Social Network    Social Network: Not on file  Intimate Partner Violence: Unknown (05/26/2021)   Received from [facility]   HITS    Physically Hurt: Not on file    Insult or Talk Down To: Not on file    Threaten Physical Harm: Not on file    Scream or Curse: Not on file    Physical Exam: Vital signs in last 24 hours: BP (!) 147/100   Pulse 73   Temp 98.2 F (36.8 C) (Temporal)   Ht 5' 3 (1.6 m)   Wt 160 lb (72.6 kg)   LMP 09/18/2023   SpO2 99%   BMI 28.34 kg/m  GEN: NAD EYE: Sclerae anicteric ENT: MMM CV: Non-tachycardic Pulm: No increased work of breathing GI: Soft, NT/ND NEURO:  Alert & Oriented   Estefana Kidney, MD [facility]  09/22/2023 2:13 PM

## 2023-09-22 NOTE — Progress Notes (Signed)
 Sedate, gd SR, tolerated procedure well, VSS, report to RN

## 2023-09-22 NOTE — Op Note (Signed)
 [facility] Patient Name: Christine Mueller Procedure Date: 09/22/2023 2:59 PM MRN: [id number] Endoscopist: Rosario Estefana Kidney , , [id number] Age: 48 Referring MD:  Date of Birth: 10-14-1975 Gender: Female Account #: [id number] Procedure:                Colonoscopy Indications:              Screening for colorectal malignant neoplasm, This                            is the patient's first colonoscopy Medicines:                Monitored Anesthesia Care Procedure:                Pre-Anesthesia Assessment:                           - Prior to the procedure, a History and Physical                            was performed, and patient medications and                            allergies were reviewed. The patient's tolerance of                            previous anesthesia was also reviewed. The risks                            and benefits of the procedure and the sedation                            options and risks were discussed with the patient.                            All questions were answered, and informed consent                            was obtained. Prior Anticoagulants: The patient has                            taken no anticoagulant or antiplatelet agents. ASA                            Grade Assessment: II - A patient with mild systemic                            disease. After reviewing the risks and benefits,                            the patient was deemed in satisfactory condition to                            undergo the procedure.  After obtaining informed consent, the colonoscope                            was passed under direct vision. Throughout the                            procedure, the patient's blood pressure, pulse, and                            oxygen saturations were monitored continuously. The                            CF [id number] #[id number] was introduced through the anus                            and advanced  to the the terminal ileum. The                            colonoscopy was performed without difficulty. The                            patient tolerated the procedure well. The quality                            of the bowel preparation was excellent. The                            terminal ileum, ileocecal valve, appendiceal                            orifice, and rectum were photographed. Scope In: 3:03:30 PM Scope Out: 3:22:45 PM Scope Withdrawal Time: 0 hours 7 minutes 58 seconds  Total Procedure Duration: 0 hours 19 minutes 15 seconds  Findings:                 The terminal ileum appeared normal.                           A 14 mm polyp was found in the transverse colon.                            The polyp was pedunculated. The polyp was removed                            with a hot snare. Resection and retrieval were                            complete. Area was tattooed with an injection of                            Spot (carbon black).                           Two sessile polyps were found in the transverse  colon. The polyps were 3 to 5 mm in size. These                            polyps were removed with a cold snare. Resection                            and retrieval were complete.                           Non-bleeding internal hemorrhoids were found during                            retroflexion. Complications:            No immediate complications. Estimated Blood Loss:     Estimated blood loss was minimal. Impression:               - The examined portion of the ileum was normal.                           - One 14 mm polyp in the transverse colon, removed                            with a hot snare. Resected and retrieved. Tattooed.                           - Two 3 to 5 mm polyps in the transverse colon,                            removed with a cold snare. Resected and retrieved.                           - Non-bleeding internal  hemorrhoids. Recommendation:           - Discharge patient to home (with escort).                           - Await pathology results.                           - The findings and recommendations were discussed                            with the patient. Dr Estefana Federico Rosario Estefana Federico,  09/22/2023 3:26:32 PM

## 2023-09-22 NOTE — Patient Instructions (Addendum)
 Non-bleeding internal hemorrhoids. Recommendation:           - Discharge patient to home (with escort).                           - Await pathology results.                           - The findings and recommendations were discussed                            with the patient.  Handout on polyps given       YOU HAD AN ENDOSCOPIC PROCEDURE TODAY AT THE [facility]:   Refer to the procedure report that was given to you for any specific questions about what was found during the examination.  If the procedure report does not answer your questions, please call your gastroenterologist to clarify.  If you requested that your care partner not be given the details of your procedure findings, then the procedure report has been included in a sealed envelope for you to review at your convenience later.  YOU SHOULD EXPECT: Some feelings of bloating in the abdomen. Passage of more gas than usual.  Walking can help get rid of the air that was put into your GI tract during the procedure and reduce the bloating. If you had a lower endoscopy (such as a colonoscopy or flexible sigmoidoscopy) you may notice spotting of blood in your stool or on the toilet paper. If you underwent a bowel prep for your procedure, you may not have a normal bowel movement for a few days.  Please Note:  You might notice some irritation and congestion in your nose or some drainage.  This is from the oxygen used during your procedure.  There is no need for concern and it should clear up in a day or so.  SYMPTOMS TO REPORT IMMEDIATELY:  Following lower endoscopy (colonoscopy or flexible sigmoidoscopy):  Excessive amounts of blood in the stool  Significant tenderness or worsening of abdominal pains  Swelling of the abdomen that is new, acute  Fever of 100F or higher   For urgent or emergent issues, a gastroenterologist can be reached at any hour by calling [phone]. Do not use MyChart messaging for urgent  concerns.    DIET:  We do recommend a small meal at first, but then you may proceed to your regular diet.  Drink plenty of fluids but you should avoid alcoholic beverages for 24 hours.  ACTIVITY:  You should plan to take it easy for the rest of today and you should NOT DRIVE or use heavy machinery until tomorrow (because of the sedation medicines used during the test).    FOLLOW UP: Our staff will call the number listed on your records the next business day following your procedure.  We will call around 7:15- 8:00 am to check on you and address any questions or concerns that you may have regarding the information given to you following your procedure. If we do not reach you, we will leave a message.     If any biopsies were taken you will be contacted by phone or by letter within the next 1-3 weeks.  Please call us at [phone] if you have not heard about the biopsies in 3 weeks.    SIGNATURES/CONFIDENTIALITY: You and/or your care partner  have signed paperwork which will be entered into your electronic medical record.  These signatures attest to the fact that that the information above on your After Visit Summary has been reviewed and is understood.  Full responsibility of the confidentiality of this discharge information lies with you and/or your care-partner.

## 2023-09-23 ENCOUNTER — Telehealth: Payer: Self-pay | Admitting: *Deleted

## 2023-09-23 NOTE — Telephone Encounter (Signed)
 Attempted post procedure follow up call.  No answer - LVM.

## 2023-09-26 NOTE — Telephone Encounter (Signed)
 Patient calls back to nurse line to check status of FMLA paperwork.   Please advise.   Christine JAYSON English, RN

## 2023-09-27 NOTE — Telephone Encounter (Signed)
 Forms placed up front for pick up.   Copy made for batch scanning.   Patient is aware.

## 2023-09-29 ENCOUNTER — Ambulatory Visit: Payer: Self-pay | Admitting: Internal Medicine

## 2023-09-29 LAB — SURGICAL PATHOLOGY

## 2023-10-07 ENCOUNTER — Ambulatory Visit (INDEPENDENT_AMBULATORY_CARE_PROVIDER_SITE_OTHER): Admitting: Family Medicine

## 2023-10-07 ENCOUNTER — Encounter: Payer: Self-pay | Admitting: Family Medicine

## 2023-10-07 ENCOUNTER — Other Ambulatory Visit (HOSPITAL_COMMUNITY): Payer: Self-pay

## 2023-10-07 VITALS — BP 138/90 | HR 73 | Ht 63.0 in | Wt 156.8 lb

## 2023-10-07 DIAGNOSIS — M21619 Bunion of unspecified foot: Secondary | ICD-10-CM

## 2023-10-07 DIAGNOSIS — I10 Essential (primary) hypertension: Secondary | ICD-10-CM

## 2023-10-07 DIAGNOSIS — Z720 Tobacco use: Secondary | ICD-10-CM

## 2023-10-07 MED ORDER — NICOTINE 7 MG/24HR TD PT24
7.0000 mg | MEDICATED_PATCH | Freq: Every day | TRANSDERMAL | 0 refills | Status: AC
  Filled 2023-10-07: qty 14, fill #0

## 2023-10-07 MED ORDER — NICOTINE 14 MG/24HR TD PT24
14.0000 mg | MEDICATED_PATCH | Freq: Every day | TRANSDERMAL | 0 refills | Status: AC
  Filled 2023-10-07: qty 14, 14d supply, fill #0

## 2023-10-07 MED ORDER — NICOTINE 21 MG/24HR TD PT24
21.0000 mg | MEDICATED_PATCH | Freq: Every day | TRANSDERMAL | 0 refills | Status: AC
  Filled 2023-10-07: qty 28, 28d supply, fill #0

## 2023-10-07 NOTE — Assessment & Plan Note (Addendum)
 Borderline in office today but reported home BP within goal.  Patient was also drinking a red bull during her visit today.  Discussed this with her Continue amlodipine  and spironolactone  Placed future order for metabolic panel, advised that this be checked for monitoring when she comes in for her Pap smear appointment

## 2023-10-07 NOTE — Patient Instructions (Addendum)
 You should hear back in the next couple of weeks about your podiatry referral  Continue taking your current medications  Please make an appointment to get a Pap smear and blood work whenever you are able to do so  Please follow the instructions for your nicotine  replacement patches.  Use the 21 mg patch daily for 4 weeks, followed by the 14 mg patch for 2 weeks, followed by the 7 mg patch for 2 weeks.  Your best to avoid vaping in the meantime.  Let me know if you have any issues.  Lets follow-up in 2 months.

## 2023-10-07 NOTE — Progress Notes (Cosign Needed Addendum)
    SUBJECTIVE:   CHIEF COMPLAINT / HPI:   Follow-up for hypertension At last visit her hydralazine  was discontinued Home BP runs 130s/80s   Due for Pap smear Last pap: 2019, NILM Not interested in contraception Prefers to make another appointment for her Pap smear  Desires vaping cessation Her vape does have nicotine  in it She does endorse vaping within of waking up Used to smoke 1ppd x 61yrs Now feels she vapes even more often than she smoked  Bunion of left great toe Patient wears steel toe boots at work and it is starting to bother her Occasionally swells up but it is not swollen today.  No fevers, redness   PERTINENT  PMH / PSH: Hypertension  OBJECTIVE:   BP (!) 138/90   Pulse 73   Ht 5' 3 (1.6 m)   Wt 156 lb 12.8 oz (71.1 kg)   LMP 09/18/2023   SpO2 99%   BMI 27.78 kg/m    General: NAD, pleasant, able to participate in exam Cardiac: RRR, no murmurs auscultated Respiratory: CTAB, normal WOB Abdomen: soft, non-tender, non-distended, normoactive bowel sounds Extremities: warm and well perfused, no edema or cyanosis.  Bunion of great toe on left foot, mildly tender to palpation, no significant erythema or swelling. Skin: warm and dry, no rashes noted Neuro: alert, no obvious focal deficits, speech normal Psych: Normal affect and mood  ASSESSMENT/PLAN:   Assessment & Plan Primary hypertension Borderline in office today but reported home BP within goal.  Patient was also drinking a red bull during her visit today.  Discussed this with her Continue amlodipine  and spironolactone  Placed future order for metabolic panel, advised that this be checked for monitoring when she comes in for her Pap smear appointment Bunion Placed referral to podiatry, patient desires removal Current every day nicotine  vaping Rx nicotine  patch 21 mg x 4 weeks followed by 14 mg x 2 weeks followed by 7 mg x 2 weeks Follow-up in 2 months   Payton Coward, MD [facility]

## 2023-10-20 ENCOUNTER — Ambulatory Visit (INDEPENDENT_AMBULATORY_CARE_PROVIDER_SITE_OTHER): Admitting: Podiatry

## 2023-10-20 ENCOUNTER — Encounter: Payer: Self-pay | Admitting: Podiatry

## 2023-10-20 ENCOUNTER — Ambulatory Visit (INDEPENDENT_AMBULATORY_CARE_PROVIDER_SITE_OTHER)

## 2023-10-20 VITALS — Ht 63.0 in | Wt 156.8 lb

## 2023-10-20 DIAGNOSIS — D492 Neoplasm of unspecified behavior of bone, soft tissue, and skin: Secondary | ICD-10-CM | POA: Diagnosis not present

## 2023-10-20 DIAGNOSIS — M21611 Bunion of right foot: Secondary | ICD-10-CM

## 2023-10-20 DIAGNOSIS — M21612 Bunion of left foot: Secondary | ICD-10-CM

## 2023-10-20 DIAGNOSIS — M2011 Hallux valgus (acquired), right foot: Secondary | ICD-10-CM | POA: Diagnosis not present

## 2023-10-20 DIAGNOSIS — M2012 Hallux valgus (acquired), left foot: Secondary | ICD-10-CM | POA: Diagnosis not present

## 2023-10-20 DIAGNOSIS — M2042 Other hammer toe(s) (acquired), left foot: Secondary | ICD-10-CM | POA: Diagnosis not present

## 2023-10-20 DIAGNOSIS — M21619 Bunion of unspecified foot: Secondary | ICD-10-CM

## 2023-10-20 NOTE — Progress Notes (Signed)
 Subjective:  Patient ID: Christine Mueller, female    DOB: 16-Nov-1975,  MRN: [id number]  Chief Complaint  Patient presents with   Bunions    RM 10 Patient is here for bilateral bunion pain. Patient occupation is working in a [employer]. Patent has a painful callus on the left foot lateral side.    Discussed the use of AI scribe software for clinical note transcription with the patient, who gave verbal consent to proceed.  History of Present Illness Christine Mueller is a 48 year old female who presents with bilateral foot pain due to bunions.  She experiences pain in both feet, with the left foot being slightly more painful. 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 The pain is less severe when wearing open shoes like flip-flops.  She has tried non-surgical treatment options including changes in shoe wear, anti-inflammatories, and padding, which have been ineffective in reducing her pain. She has been using pads to take pressure off the nerve, which have provided some relief but not significantly.  She also has a corn on the fourth toe and a plantar wart on the lateral side of her left foot. The corn is associated with a hammer toe deformity, which causes pressure and pain. The corn is painful, especially when pressure is applied.  She smokes and vapes but is planning to quit. No diabetes.      Objective:    Physical Exam VASCULAR: DP and PT pulse palpable. Foot is warm and well-perfused. Capillary fill time is brisk. DERMATOLOGIC: Normal skin turgor, texture, and temperature. No open lesions, rashes, or ulcerations. Benign appearing skin lesion on plantar lateral midfoot of left foot. NEUROLOGIC: Normal sensation to light touch and pressure. No paresthesias on examination. ORTHOPEDIC:  Bilateral hallux valgus deformity with palpable dorsomedial bunion. Hammer  toe contracture of fourth toe with dorsolateral corn over PIPJ.  Pain over the bunion there is no pain with range of motion of the joint   No images are attached to the encounter.    Results RADIOLOGY Bilateral foot X-ray: Moderate hallux valgus deformity with dorsomedial bunion formation and digital contractures (10/20/2023)   PROCEDURE Debridement of plantar wart (10/20/2023) Description: The lesion on the plantar lateral left foot was debrided sharply with a chisel to excise the lesion. Exposed central core and salicylic acid treatment was applied. Dressed with a bandage and post-care instructions given.   Assessment:   1. Hallux valgus with bunions, left   2. Hallux valgus with bunions, right   3. Hammertoe of left foot      Plan:  Patient was evaluated and treated and all questions answered.  Assessment and Plan Assessment & Plan Bilateral hallux valgus (bunion), left foot hammertoe Bilateral hallux valgus with moderate deformity and dorsal medial bunion formation. Pain exacerbated by tight footwear. Non-surgical treatments have been ineffective. Surgical correction is recommended. Discussed procedure involving bone cuts and screw fixation. Risks include but are not limited to  pain, swelling, infection, scar, numbness which may be temporary or permanent, chronic pain, stiffness, nerve pain or damage, wound healing problems, bone healing problems including delayed or non-union and need for revision surgery in the event of nonunion. Smoking and vaping may affect bone healing. - She has exhausted all nonoperative treatment prior to the visit.  Wider shoes offloading pads and anti-inflammatories have not been helpful.  She would like to proceed with  surgical correction. - Advise cessation of vaping before and after surgery. - Complete paperwork for surgery scheduling.   Plantar wart of left foot Benign appearing skin lesion on the plantar lateral midfoot of the left foot,  consistent with a plantar wart. Debrided sharply and treated with salicylic acid. - Apply maximum strength (40% salicylic acid) corn or wart remover pads nightly until resolved.      Surgical plan:  Procedure: - Left foot bunionectomy, fourth hammertoe correction  Location: - [facility]  Anesthesia plan: - General With local  Postoperative pain plan: - Tylenol  1000 mg every 6 hours, ibuprofen  600 mg every 6 hours, gabapentin 300 mg every 8 hours x5 days, oxycodone  5 mg 1-2 tabs every 6 hours only as needed  DVT prophylaxis: - None required  WB Restrictions / DME needs: - WBAT in CAM boot postop    No follow-ups on file.

## 2023-10-21 ENCOUNTER — Ambulatory Visit: Admitting: Family Medicine

## 2023-10-21 NOTE — Progress Notes (Deleted)
    SUBJECTIVE:   CHIEF COMPLAINT / HPI:   Due for Pap smear Last pap: *** STD testing: *** Contraception: *** ***  HTN f/u On amlodipine  and spironolactone   Due for BMP  PERTINENT  PMH / PSH: ***  OBJECTIVE:   LMP 09/18/2023   ***  ASSESSMENT/PLAN:   Assessment & Plan    Payton Coward, MD [facility]

## 2023-12-05 ENCOUNTER — Encounter: Payer: Self-pay | Admitting: Family Medicine

## 2023-12-05 ENCOUNTER — Ambulatory Visit (INDEPENDENT_AMBULATORY_CARE_PROVIDER_SITE_OTHER): Admitting: Family Medicine

## 2023-12-05 VITALS — BP 130/70 | HR 81 | Ht 63.0 in | Wt 155.0 lb

## 2023-12-05 DIAGNOSIS — I10 Essential (primary) hypertension: Secondary | ICD-10-CM

## 2023-12-05 DIAGNOSIS — M21619 Bunion of unspecified foot: Secondary | ICD-10-CM | POA: Diagnosis not present

## 2023-12-05 DIAGNOSIS — Z01818 Encounter for other preprocedural examination: Secondary | ICD-10-CM

## 2023-12-05 NOTE — Patient Instructions (Addendum)
 Please let me know if your podiatrist requires a formal form to fill out  Good luck with the procedure  Make an appointment for your pap smear soon   If any of your results from today are abnormal and/or require changes to your medical care, I will give you a call. Otherwise, I will send you a letter in the mail or a message on MyChart.

## 2023-12-05 NOTE — Assessment & Plan Note (Signed)
  The patient is at low risk of complications from a low risk surgery.  I recommend the following additional tests: CBC, BMP today I recommend the following changes to medications in the perioperative setting: Hold spironolactone  day of surgery, ok to resume 1 day afterwards

## 2023-12-05 NOTE — Progress Notes (Signed)
    SUBJECTIVE:   CHIEF COMPLAINT / HPI:   Needs medical clearance for bunionectomy Current medications include albuterol  as needed, amlodipine  10 mg daily, nicotine  patch, spironolactone  12.5 mg daily  Surgical Pre-operative Evaluation:   Procedure: bunionectomy Procedural risk: low   Anesthesia: unsure, ?general  PMH: HTN Tobacco use  Surgical History:  The patient denies any complication with anesthesia, bleeding, or post-operative confusion, nausea, or vomiting in prior surgical interventions. The patient denies any history of spinal surgeries.  Family History: The patient denies any family history of complications with anesthesia and VTE.  Social History: Tobacco Use: vaping with nicotine  multiple times daily Alcohol Use: none Other substance use: daily marijuana use  Screening for OSA: STOP BANG score of 2, low risk of OSA  STOP-BANG for sleep apnea: -Snores loudly: yes -Tired, fatigued, sleepy during daytime: no -Observed to have apneic episodes during sleep: no -High blood pressure: yes  BMI >35: no Age >50: no Female gender: no Neck circumference >40: no  Score (1 point for each positive item): 2  0-2: Low risk for moderate/severe OSA   Medication Adjustments:  - Hold spironolactone  on day of surgery, continue afterwards - otherwise continue amlodipine   Risk Stratification Tool: Charlanne Calculator: 0.1 % risk of MI or cardiac event, intraoperatively or up to 30 days post-op  Functional Capacity:  Able to climb stairs without issue, exercises without issue, >4 METS - no stress test recommended   Labs to Check: - CBC, BMP   PERTINENT  PMH / PSH: HTN, tobacco use  OBJECTIVE:   BP 130/70   Pulse 81   Ht 5' 3 (1.6 m)   Wt 155 lb (70.3 kg)   LMP 11/28/2023   SpO2 97%   BMI 27.46 kg/m    General: NAD, pleasant, able to participate in exam Cardiac: RRR, no murmurs auscultated Respiratory: CTAB, normal WOB Abdomen: soft, non-tender,  non-distended, normoactive bowel sounds Extremities: warm and well perfused, no edema or cyanosis Skin: warm and dry, no rashes noted Neuro: alert, no obvious focal deficits, speech normal Psych: Normal affect and mood   ASSESSMENT/PLAN:    Assessment & Plan Pre-op exam Bunion Primary hypertension  The patient is at low risk of complications from a low risk surgery.  I recommend the following additional tests: CBC, BMP today I recommend the following changes to medications in the perioperative setting: Hold spironolactone  day of surgery, ok to resume 1 day afterwards   Payton Coward, MD [facility]

## 2023-12-06 ENCOUNTER — Ambulatory Visit: Payer: Self-pay | Admitting: Family Medicine

## 2023-12-06 LAB — BASIC METABOLIC PANEL WITH GFR
BUN/Creatinine Ratio: 13 (ref 9–23)
BUN: 12 mg/dL (ref 6–24)
CO2: 23 mmol/L (ref 20–29)
Calcium: 9.2 mg/dL (ref 8.7–10.2)
Chloride: 102 mmol/L (ref 96–106)
Creatinine, Ser: 0.94 mg/dL (ref 0.57–1.00)
Glucose: 93 mg/dL (ref 70–99)
Potassium: 3.8 mmol/L (ref 3.5–5.2)
Sodium: 139 mmol/L (ref 134–144)
eGFR: 75 mL/min/1.73 (ref >59)

## 2023-12-06 LAB — CBC WITH DIFFERENTIAL/PLATELET
Basophils Absolute: 0.1 x10E3/uL (ref 0.0–0.2)
Basos: 1 %
EOS (ABSOLUTE): 0.2 x10E3/uL (ref 0.0–0.4)
Eos: 2 %
Hematocrit: 39.2 % (ref 34.0–46.6)
Hemoglobin: 12.8 g/dL (ref 11.1–15.9)
Immature Grans (Abs): 0 x10E3/uL (ref 0.0–0.1)
Immature Granulocytes: 0 %
Lymphocytes Absolute: 2.4 x10E3/uL (ref 0.7–3.1)
Lymphs: 27 %
MCH: 31.2 pg (ref 26.6–33.0)
MCHC: 32.7 g/dL (ref 31.5–35.7)
MCV: 96 fL (ref 79–97)
Monocytes Absolute: 0.7 x10E3/uL (ref 0.1–0.9)
Monocytes: 8 %
Neutrophils Absolute: 5.5 x10E3/uL (ref 1.4–7.0)
Neutrophils: 62 %
Platelets: 294 x10E3/uL (ref 150–450)
RBC: 4.1 x10E6/uL (ref 3.77–5.28)
RDW: 10.9 % — ABNORMAL LOW (ref 11.7–15.4)
WBC: 8.9 x10E3/uL (ref 3.4–10.8)

## 2023-12-12 ENCOUNTER — Telehealth: Payer: Self-pay | Admitting: Podiatry

## 2023-12-12 NOTE — Telephone Encounter (Signed)
 DOS- 01/13/2024  DOUBLE OSTEOTOMY LT- 28299 HAMMERTOE REPAIR LT- [id number]  BCBS EFFECTIVE DATE- 02/23/2023 BCBS [place] EFFECTIVE DATE- 05/24/2023  DEDUCTIBLE- $500 REMAINING- $0 OOP- $4000 REMAINING- $3477.04 COINSURANCE- 0%  PER COHERE PORTAL, PRIOR AUTH FOR CPT CODES 71700 AND [phone] HAVE BEEN APPROVED FROM 01/13/2024-04/14/2024. AUTH# [id number] PER CARELON PORTAL, PRIOR AUTH FOR CPT CODES 71700 AND [phone] HAVE BEEN APPROVED FROM 01/13/2024-03/12/2024. AUTH# [id number]

## 2023-12-16 ENCOUNTER — Telehealth: Payer: Self-pay

## 2023-12-16 NOTE — Telephone Encounter (Signed)
 Patient LVM on nurse line requesting a letter for work.   She reports she needs a letter supporting her upcoming surgery.   I tried to call patient back on all numbers, however no answer or option for VM.  Will await her return call for further details.

## 2023-12-19 NOTE — Telephone Encounter (Signed)
Letter placed up front for pick up.  ? ?Mother has been made aware.  ?

## 2023-12-19 NOTE — Telephone Encounter (Signed)
 Patients mother calls nurse line in regards to letter.   Mother reports she needs a letter stating she has surgery scheduled for 11/21 and will be out of work for 4 weeks.   She reports her job is requesting this. She reports she already has FMLA in placed.   Advised will forward to PCP.

## 2023-12-20 ENCOUNTER — Telehealth: Payer: Self-pay | Admitting: Family Medicine

## 2023-12-20 NOTE — Telephone Encounter (Signed)
 Clinical info completed on Medical Family Leave form.  Given to PCP while in clinic today for completion.    When form is completed, please route note to RN Team and place in wall pocket in front office.   Nelson Land, CMA

## 2023-12-20 NOTE — Telephone Encounter (Signed)
 Pt dropped off form at front desk for for foot.  Verified that patient section of form has been completed.  Last DOS/WCC with PCP was 12/05/2023.  Placed form in [facility] team folder to be completed by clinical staff.  Marjorie DELENA Kiang

## 2023-12-20 NOTE — Telephone Encounter (Signed)
Form placed up front for pick up.   Copy made for batch scanning.   Patient has been made aware.

## 2023-12-20 NOTE — Telephone Encounter (Signed)
Reviewed, completed, and signed form.  Note routed to RN team inbasket and placed completed form in RN Wall pocket in the front office.    Eathan Groman, MD  

## 2024-01-02 ENCOUNTER — Other Ambulatory Visit: Payer: Self-pay

## 2024-01-02 DIAGNOSIS — I10 Essential (primary) hypertension: Secondary | ICD-10-CM

## 2024-01-02 MED ORDER — SPIRONOLACTONE 25 MG PO TABS
ORAL_TABLET | ORAL | 3 refills | Status: AC
Start: 2024-01-02 — End: ?

## 2024-01-02 MED ORDER — AMLODIPINE BESYLATE 10 MG PO TABS: ORAL_TABLET | ORAL | 3 refills | Status: AC

## 2024-01-04 ENCOUNTER — Inpatient Hospital Stay
Admission: RE | Admit: 2024-01-04 | Discharge: 2024-01-04 | Disposition: A | Discharge status: Home / Self Care | Source: Ambulatory Visit

## 2024-01-04 HISTORY — DX: Other hammer toe(s) (acquired), left foot: M20.42

## 2024-01-04 HISTORY — DX: Cannabis use, unspecified, uncomplicated: F12.90

## 2024-01-04 HISTORY — DX: Atherosclerosis of renal artery: I70.1

## 2024-01-04 HISTORY — DX: Tobacco use: Z72.0

## 2024-01-04 HISTORY — DX: Bunion of left foot: M21.612

## 2024-01-04 NOTE — Progress Notes (Signed)
 Called pt multiple times to do anesthesia interview. Even called pts mom and left her a message as well. Phone call was never returned. Moved pt to a phone interview again on 01-06-24 and will try pt again

## 2024-01-04 NOTE — Patient Instructions (Signed)
 Your procedure is scheduled on:01-13-24 Friday Report to the Registration Desk on the 1st floor of the Medical Mall.Then proceed to the 2nd floor Surgery Desk To find out your arrival time, please call [phone] between 1PM - 3PM on:01-12-24 Thursday If your arrival time is 6:00 am, do not arrive before that time as the Medical Mall entrance doors do not open until 6:00 am.  REMEMBER: Instructions that are not followed completely may result in serious medical risk, up to and including death; or upon the discretion of your surgeon and anesthesiologist your surgery may need to be rescheduled.  Do not eat food OR drink liquids after midnight the night before surgery.  No gum chewing or hard candies.  In addition, your doctor has ordered for you to drink the provided:  Ensure Pre-Surgery Clear Carbohydrate Drink  Drinking this carbohydrate drink up to two hours before surgery helps to reduce insulin resistance and improve patient outcomes. Please complete drinking 2 hours before scheduled arrival time.  One week prior to surgery:Stop NOW (01-04-24) Stop Anti-inflammatories (NSAIDS) such as Advil , Aleve , Ibuprofen , Motrin , Naproxen , Naprosyn  and Aspirin  based products such as Excedrin , Goody's Powder, BC Powder. Stop ANY OVER THE COUNTER supplements until after surgery.  You may however, continue to take Tylenol  if needed for pain up until the day of surgery.  Continue taking all of your other prescription medications up until the day of surgery.  ON THE DAY OF SURGERY ONLY TAKE THESE MEDICATIONS WITH SIPS OF WATER: -amLODipine  (NORVASC )   Bring your Albuterol  Inhaler to the hospital  No Alcohol for 24 hours before or after surgery.  No Smoking including e-cigarettes for 24 hours before surgery.  No chewable tobacco products for at least 6 hours before surgery.  No nicotine  patches on the day of surgery.  Do not use any recreational drugs for at least a week (preferably 2 weeks)  before your surgery.  Please be advised that the combination of cocaine and anesthesia may have negative outcomes, up to and including death. If you test positive for cocaine, your surgery will be cancelled.  On the morning of surgery brush your teeth with toothpaste and water, you may rinse your mouth with mouthwash if you wish. Do not swallow any toothpaste or mouthwash.  Use CHG Soap as directed on instruction sheet.  Do not wear jewelry, make-up, hairpins, clips or nail polish.  For welded (permanent) jewelry: bracelets, anklets, waist bands, etc.  Please have this removed prior to surgery.  If it is not removed, there is a chance that hospital personnel will need to cut it off on the day of surgery.  Do not wear lotions, powders, or perfumes.   Do not shave body hair from the neck down 48 hours before surgery.  Contact lenses, hearing aids and dentures may not be worn into surgery.  Do not bring valuables to the hospital. [facility] is not responsible for any missing/lost belongings or valuables.   Notify your doctor if there is any change in your medical condition (cold, fever, infection).  Wear comfortable clothing (specific to your surgery type) to the hospital.  After surgery, you can help prevent lung complications by doing breathing exercises.  Take deep breaths and cough every 1-2 hours. Your doctor may order a device called an Incentive Spirometer to help you take deep breaths. When coughing or sneezing, hold a pillow firmly against your incision with both hands. This is called "splinting." Doing this helps protect your incision. It also decreases  belly discomfort.  If you are being admitted to the hospital overnight, leave your suitcase in the car. After surgery it may be brought to your room.  In case of increased patient census, it may be necessary for you, the patient, to continue your postoperative care in the Same Day Surgery department.  If you are being  discharged the day of surgery, you will not be allowed to drive home. You will need a responsible individual to drive you home and stay with you for 24 hours after surgery.   If you are taking public transportation, you will need to have a responsible individual with you.  Please call the Pre-admissions Testing Dept. at [phone] if you have any questions about these instructions.  Surgery Visitation Policy:  Patients having surgery or a procedure may have two visitors.  Children under the age of 94 must have an adult with them who is not the patient.                                                                                                             Preparing for Surgery with CHLORHEXIDINE GLUCONATE (CHG) Soap  Chlorhexidine Gluconate (CHG) Soap  o An antiseptic cleaner that kills germs and bonds with the skin to continue killing germs even after washing  o Used for showering the night before surgery and morning of surgery  Before surgery, you can play an important role by reducing the number of germs on your skin.  CHG (Chlorhexidine gluconate) soap is an antiseptic cleanser which kills germs and bonds with the skin to continue killing germs even after washing.  Please do not use if you have an allergy to CHG or antibacterial soaps. If your skin becomes reddened/irritated stop using the CHG.  1. Shower the NIGHT BEFORE SURGERY with CHG soap.  2. If you choose to wash your hair, wash your hair first as usual with your normal shampoo.  3. After shampooing, rinse your hair and body thoroughly to remove the shampoo.  4. Use CHG as you would any other liquid soap. You can apply CHG directly to the skin and wash gently with a clean washcloth.  5. Apply the CHG soap to your body only from the neck down. Do not use on open wounds or open sores. Avoid contact with your eyes, ears, mouth, and genitals (private parts). Wash face and genitals (private parts) with your normal  soap.  6. Wash thoroughly, paying special attention to the area where your surgery will be performed.  7. Thoroughly rinse your body with warm water.  8. Do not shower/wash with your normal soap after using and rinsing off the CHG soap.  9. Do not use lotions, oils, etc., after showering with CHG.  10. Pat yourself dry with a clean towel.  11. Wear clean pajamas to bed the night before surgery.  12. Place clean sheets on your bed the night of your shower and do not sleep with pets.  13. Do not apply any deodorants/lotions/powders.  14. Please wear clean clothes  to the hospital.  15. Remember to brush your teeth with your regular toothpaste.   Merchandiser, Retail to address health-related social needs:  [email or web address]

## 2024-01-06 ENCOUNTER — Inpatient Hospital Stay
Admission: RE | Admit: 2024-01-06 | Discharge: 2024-01-06 | Disposition: A | Discharge status: Home / Self Care | Source: Ambulatory Visit

## 2024-01-06 NOTE — Progress Notes (Addendum)
 Called pt multiple times again today having to leave messages with no call back. Grayce who was answering the phone in PAT today let me know that pt called back and left message on PAT vm to call her back. I just tried 2 different times to call pt back and now her vm is full so I cannot leave a message. Will have to move pt to next week when we have availability

## 2024-01-06 NOTE — Progress Notes (Signed)
 Called pt again to do anesthesia interview. Had to leave another message. If pt does not call back will reach out to Dr Denson office and let them know

## 2024-01-11 ENCOUNTER — Other Ambulatory Visit: Payer: Self-pay

## 2024-01-11 ENCOUNTER — Encounter
Admission: RE | Admit: 2024-01-11 | Discharge: 2024-01-11 | Disposition: A | Discharge status: Home / Self Care | Source: Ambulatory Visit | Attending: Podiatry | Admitting: Podiatry

## 2024-01-11 VITALS — Wt 155.0 lb

## 2024-01-11 DIAGNOSIS — I10 Essential (primary) hypertension: Secondary | ICD-10-CM

## 2024-01-11 DIAGNOSIS — Z01812 Encounter for preprocedural laboratory examination: Secondary | ICD-10-CM

## 2024-01-11 DIAGNOSIS — Z01810 Encounter for preprocedural cardiovascular examination: Secondary | ICD-10-CM

## 2024-01-11 DIAGNOSIS — I701 Atherosclerosis of renal artery: Secondary | ICD-10-CM

## 2024-01-11 DIAGNOSIS — Z01818 Encounter for other preprocedural examination: Secondary | ICD-10-CM

## 2024-01-11 DIAGNOSIS — M21612 Bunion of left foot: Secondary | ICD-10-CM

## 2024-01-11 DIAGNOSIS — J449 Chronic obstructive pulmonary disease, unspecified: Secondary | ICD-10-CM

## 2024-01-11 HISTORY — DX: Anemia, unspecified: D64.9

## 2024-01-11 NOTE — Patient Instructions (Signed)
 Your procedure is scheduled on:01-13-24 Friday Report to the Registration Desk on the 1st floor of the [facility].Then proceed to the 2nd floor surgery desk To find out your arrival time, please call [phone] between 1PM - 3PM on:01-12-24 Thursday If your arrival time is 6:00 am, do not arrive before that time as the [facility] entrance doors do not open until 6:00 am.  REMEMBER: Instructions that are not followed completely may result in serious medical risk, up to and including death; or upon the discretion of your surgeon and anesthesiologist your surgery may need to be rescheduled.  Do not eat food OR drink liquids after midnight the night before surgery. (The only liquid allowed will be the Ensure ordered by your surgeon) No gum chewing or hard candies.  In addition, your doctor has ordered for you to drink the provided:  Ensure Pre-Surgery Clear Carbohydrate Drink  Drinking this carbohydrate drink up to two hours before surgery helps to reduce insulin resistance and improve patient outcomes. Please complete drinking 2 hours before scheduled arrival time.  One week prior to surgery:Stop NOW (01-11-24) Stop Anti-inflammatories (NSAIDS) such as Advil , Aleve , Ibuprofen , Motrin , Naproxen , Naprosyn  and Aspirin  based products such as Excedrin , Goody's Powder, BC Powder. Stop ANY OVER THE COUNTER supplements until after surgery.  You may however, continue to take Tylenol  if needed for pain up until the day of surgery.  Continue taking all of your other prescription medications up until the day of surgery.  ON THE DAY OF SURGERY ONLY TAKE THESE MEDICATIONS WITH SIPS OF WATER: -amLODipine  (NORVASC )   Bring your Albuterol  Inhaler to the hospital the day of surgery  No Alcohol for 24 hours before or after surgery.  No Smoking including e-cigarettes for 24 hours before surgery.  No chewable tobacco products for at least 6 hours before surgery.  No nicotine  patches on the day of  surgery.  Do not use any recreational drugs for at least a week (preferably 2 weeks) before your surgery.  Please be advised that the combination of cocaine and anesthesia may have negative outcomes, up to and including death. If you test positive for cocaine, your surgery will be cancelled.  On the morning of surgery brush your teeth with toothpaste and water, you may rinse your mouth with mouthwash if you wish. Do not swallow any toothpaste or mouthwash.  Use CHG Soap as directed on instruction sheet.  Do not wear jewelry, make-up, hairpins, clips or nail polish.  For welded (permanent) jewelry: bracelets, anklets, waist bands, etc.  Please have this removed prior to surgery.  If it is not removed, there is a chance that hospital personnel will need to cut it off on the day of surgery.  Do not wear lotions, powders, or perfumes.   Do not shave body hair from the neck down 48 hours before surgery.  Contact lenses, hearing aids and dentures may not be worn into surgery.  Do not bring valuables to the hospital. [facility] is not responsible for any missing/lost belongings or valuables.   Notify your doctor if there is any change in your medical condition (cold, fever, infection).  Wear comfortable clothing (specific to your surgery type) to the hospital.  After surgery, you can help prevent lung complications by doing breathing exercises.  Take deep breaths and cough every 1-2 hours. Your doctor may order a device called an Incentive Spirometer to help you take deep breaths. When coughing or sneezing, hold a pillow firmly against your incision with  both hands. This is called "splinting." Doing this helps protect your incision. It also decreases belly discomfort.  If you are being admitted to the hospital overnight, leave your suitcase in the car. After surgery it may be brought to your room.  In case of increased patient census, it may be necessary for you, the patient, to continue  your postoperative care in the Same Day Surgery department.  If you are being discharged the day of surgery, you will not be allowed to drive home. You will need a responsible individual to drive you home and stay with you for 24 hours after surgery.   If you are taking public transportation, you will need to have a responsible individual with you.  Please call the Pre-admissions Testing Dept. at [phone] if you have any questions about these instructions.  Surgery Visitation Policy:  Patients having surgery or a procedure may have two visitors.  Children under the age of 12 must have an adult with them who is not the patient.                                                                                                             Preparing for Surgery with CHLORHEXIDINE GLUCONATE (CHG) Soap  Chlorhexidine Gluconate (CHG) Soap  o An antiseptic cleaner that kills germs and bonds with the skin to continue killing germs even after washing  o Used for showering the night before surgery and morning of surgery  Before surgery, you can play an important role by reducing the number of germs on your skin.  CHG (Chlorhexidine gluconate) soap is an antiseptic cleanser which kills germs and bonds with the skin to continue killing germs even after washing.  Please do not use if you have an allergy to CHG or antibacterial soaps. If your skin becomes reddened/irritated stop using the CHG.  1. Shower the NIGHT BEFORE SURGERY with CHG soap.  2. If you choose to wash your hair, wash your hair first as usual with your normal shampoo.  3. After shampooing, rinse your hair and body thoroughly to remove the shampoo.  4. Use CHG as you would any other liquid soap. You can apply CHG directly to the skin and wash gently with a clean washcloth.  5. Apply the CHG soap to your body only from the neck down. Do not use on open wounds or open sores. Avoid contact with your eyes, ears, mouth, and  genitals (private parts). Wash face and genitals (private parts) with your normal soap.  6. Wash thoroughly, paying special attention to the area where your surgery will be performed.  7. Thoroughly rinse your body with warm water.  8. Do not shower/wash with your normal soap after using and rinsing off the CHG soap.  9. Do not use lotions, oils, etc., after showering with CHG.  10. Pat yourself dry with a clean towel.  11. Wear clean pajamas to bed the night before surgery.  12. Place clean sheets on your bed the night of your shower and do not sleep  with pets.  13. Do not apply any deodorants/lotions/powders.  14. Please wear clean clothes to the hospital.  15. Remember to brush your teeth with your regular toothpaste.   Merchandiser, Retail to address health-related social needs:  [email or web address]

## 2024-01-12 ENCOUNTER — Encounter
Admission: RE | Admit: 2024-01-12 | Discharge: 2024-01-12 | Disposition: A | Discharge status: Home / Self Care | Source: Ambulatory Visit | Attending: Podiatry | Admitting: Podiatry

## 2024-01-12 DIAGNOSIS — K219 Gastro-esophageal reflux disease without esophagitis: Secondary | ICD-10-CM | POA: Diagnosis not present

## 2024-01-12 DIAGNOSIS — Z01812 Encounter for preprocedural laboratory examination: Secondary | ICD-10-CM

## 2024-01-12 DIAGNOSIS — Z01818 Encounter for other preprocedural examination: Secondary | ICD-10-CM | POA: Diagnosis present

## 2024-01-12 DIAGNOSIS — J4489 Other specified chronic obstructive pulmonary disease: Secondary | ICD-10-CM | POA: Diagnosis not present

## 2024-01-12 DIAGNOSIS — I701 Atherosclerosis of renal artery: Secondary | ICD-10-CM | POA: Diagnosis not present

## 2024-01-12 DIAGNOSIS — I10 Essential (primary) hypertension: Secondary | ICD-10-CM | POA: Insufficient documentation

## 2024-01-12 DIAGNOSIS — J449 Chronic obstructive pulmonary disease, unspecified: Secondary | ICD-10-CM | POA: Insufficient documentation

## 2024-01-12 DIAGNOSIS — Z7984 Long term (current) use of oral hypoglycemic drugs: Secondary | ICD-10-CM | POA: Diagnosis not present

## 2024-01-12 DIAGNOSIS — F17200 Nicotine dependence, unspecified, uncomplicated: Secondary | ICD-10-CM | POA: Diagnosis not present

## 2024-01-12 DIAGNOSIS — Z01810 Encounter for preprocedural cardiovascular examination: Secondary | ICD-10-CM

## 2024-01-12 DIAGNOSIS — E119 Type 2 diabetes mellitus without complications: Secondary | ICD-10-CM | POA: Diagnosis not present

## 2024-01-12 DIAGNOSIS — M2042 Other hammer toe(s) (acquired), left foot: Secondary | ICD-10-CM | POA: Diagnosis present

## 2024-01-12 DIAGNOSIS — M2012 Hallux valgus (acquired), left foot: Secondary | ICD-10-CM | POA: Diagnosis present

## 2024-01-12 LAB — BASIC METABOLIC PANEL WITH GFR
Anion gap: 8 (ref 5–15)
BUN: 12 mg/dL (ref 6–20)
CO2: 24 mmol/L (ref 22–32)
Calcium: 9.3 mg/dL (ref 8.9–10.3)
Chloride: 108 mmol/L (ref 98–111)
Creatinine, Ser: 0.85 mg/dL (ref 0.44–1.00)
GFR, Estimated: >60 mL/min (ref >60)
Glucose, Bld: 73 mg/dL (ref 70–99)
Potassium: 4.2 mmol/L (ref 3.5–5.1)
Sodium: 140 mmol/L (ref 135–145)

## 2024-01-12 MED ORDER — CHLORHEXIDINE GLUCONATE 0.12 % MT SOLN
15.0000 mL | Freq: Once | OROMUCOSAL | Status: AC
  Administered 2024-01-13: 15 mL via OROMUCOSAL

## 2024-01-12 MED ORDER — ORAL CARE MOUTH RINSE: 15.0000 mL | Freq: Once | OROMUCOSAL | Status: AC

## 2024-01-12 MED ORDER — VANCOMYCIN HCL IN DEXTROSE 1-5 GM/200ML-% IV SOLN
1000.0000 mg | Freq: Once | INTRAVENOUS | Status: AC
  Administered 2024-01-13: 1000 mg via INTRAVENOUS

## 2024-01-12 MED ORDER — LACTATED RINGERS IV SOLN: INTRAVENOUS | Status: DC

## 2024-01-13 ENCOUNTER — Encounter: Payer: Self-pay | Admitting: Urgent Care

## 2024-01-13 ENCOUNTER — Encounter: Payer: Self-pay | Admitting: Podiatry

## 2024-01-13 ENCOUNTER — Other Ambulatory Visit: Payer: Self-pay

## 2024-01-13 ENCOUNTER — Ambulatory Visit

## 2024-01-13 ENCOUNTER — Ambulatory Visit: Payer: Self-pay | Admitting: Anesthesiology

## 2024-01-13 ENCOUNTER — Encounter
Admission: RE | Disposition: A | Discharge status: Home / Self Care | Payer: Self-pay | Source: Home / Self Care | Attending: Podiatry

## 2024-01-13 ENCOUNTER — Ambulatory Visit
Admission: RE | Admit: 2024-01-13 | Discharge: 2024-01-13 | Disposition: A | Discharge status: Home / Self Care | Attending: Podiatry | Admitting: Podiatry

## 2024-01-13 DIAGNOSIS — J4489 Other specified chronic obstructive pulmonary disease: Secondary | ICD-10-CM | POA: Insufficient documentation

## 2024-01-13 DIAGNOSIS — I10 Essential (primary) hypertension: Secondary | ICD-10-CM | POA: Diagnosis not present

## 2024-01-13 DIAGNOSIS — Z01818 Encounter for other preprocedural examination: Secondary | ICD-10-CM

## 2024-01-13 DIAGNOSIS — K219 Gastro-esophageal reflux disease without esophagitis: Secondary | ICD-10-CM | POA: Insufficient documentation

## 2024-01-13 DIAGNOSIS — M2042 Other hammer toe(s) (acquired), left foot: Secondary | ICD-10-CM | POA: Diagnosis not present

## 2024-01-13 DIAGNOSIS — M2012 Hallux valgus (acquired), left foot: Secondary | ICD-10-CM | POA: Diagnosis not present

## 2024-01-13 DIAGNOSIS — E119 Type 2 diabetes mellitus without complications: Secondary | ICD-10-CM | POA: Insufficient documentation

## 2024-01-13 DIAGNOSIS — F17200 Nicotine dependence, unspecified, uncomplicated: Secondary | ICD-10-CM | POA: Insufficient documentation

## 2024-01-13 DIAGNOSIS — Z7984 Long term (current) use of oral hypoglycemic drugs: Secondary | ICD-10-CM | POA: Insufficient documentation

## 2024-01-13 DIAGNOSIS — J449 Chronic obstructive pulmonary disease, unspecified: Secondary | ICD-10-CM | POA: Diagnosis not present

## 2024-01-13 DIAGNOSIS — J45909 Unspecified asthma, uncomplicated: Secondary | ICD-10-CM | POA: Diagnosis not present

## 2024-01-13 HISTORY — PX: HAMMER TOE SURGERY: SHX385

## 2024-01-13 HISTORY — PX: METATARSAL OSTEOTOMY WITH BUNIONECTOMY: SHX5662

## 2024-01-13 LAB — POCT PREGNANCY, URINE: Preg Test, Ur: NEGATIVE

## 2024-01-13 SURGERY — BUNIONECTOMY, WITH METATARSAL OSTEOTOMY
Anesthesia: General | Site: Toe | Laterality: Left

## 2024-01-13 MED ORDER — PROPOFOL 1000 MG/100ML IV EMUL
INTRAVENOUS | Status: AC
  Filled 2024-01-13: qty 100

## 2024-01-13 MED ORDER — ACETAMINOPHEN 10 MG/ML IV SOLN
INTRAVENOUS | Status: AC
Start: 2024-01-13 — End: 2024-01-13
  Filled 2024-01-13: qty 100

## 2024-01-13 MED ORDER — BUPIVACAINE HCL (PF) 0.5 % IJ SOLN
INTRAMUSCULAR | Status: DC | PRN
Start: 2024-01-13 — End: 2024-01-13
  Administered 2024-01-13: 5 mL

## 2024-01-13 MED ORDER — KETOROLAC TROMETHAMINE 30 MG/ML IJ SOLN
INTRAMUSCULAR | Status: AC
  Filled 2024-01-13: qty 1

## 2024-01-13 MED ORDER — VANCOMYCIN HCL IN DEXTROSE 1-5 GM/200ML-% IV SOLN
INTRAVENOUS | Status: AC
  Filled 2024-01-13: qty 200

## 2024-01-13 MED ORDER — GLYCOPYRROLATE 0.2 MG/ML IJ SOLN
INTRAMUSCULAR | Status: DC | PRN
  Administered 2024-01-13: .2 mg via INTRAVENOUS

## 2024-01-13 MED ORDER — ACETAMINOPHEN 500 MG PO TABS: 1000.0000 mg | ORAL_TABLET | Freq: Four times a day (QID) | ORAL | 0 refills | Status: AC | PRN

## 2024-01-13 MED ORDER — FENTANYL CITRATE (PF) 100 MCG/2ML IJ SOLN
INTRAMUSCULAR | Status: DC | PRN
  Administered 2024-01-13 (×2): 50 ug via INTRAVENOUS

## 2024-01-13 MED ORDER — KETAMINE HCL 50 MG/5ML IJ SOSY
PREFILLED_SYRINGE | INTRAMUSCULAR | Status: AC
  Filled 2024-01-13: qty 5

## 2024-01-13 MED ORDER — BUPIVACAINE LIPOSOME 1.3 % IJ SUSP
INTRAMUSCULAR | Status: AC
  Filled 2024-01-13: qty 20

## 2024-01-13 MED ORDER — SODIUM CHLORIDE 0.9 % IR SOLN
Status: DC | PRN
Start: 2024-01-13 — End: 2024-01-13
  Administered 2024-01-13: 500 mL

## 2024-01-13 MED ORDER — DEXAMETHASONE SOD PHOSPHATE PF 10 MG/ML IJ SOLN
INTRAMUSCULAR | Status: DC | PRN
  Administered 2024-01-13: 10 mg via INTRAVENOUS

## 2024-01-13 MED ORDER — PROPOFOL 10 MG/ML IV BOLUS
INTRAVENOUS | Status: DC | PRN
Start: 2024-01-13 — End: 2024-01-13
  Administered 2024-01-13: 125 ug/kg/min via INTRAVENOUS
  Administered 2024-01-13: 100 ug/kg/min via INTRAVENOUS
  Administered 2024-01-13: 100 mg via INTRAVENOUS
  Administered 2024-01-13 (×2): 50 mg via INTRAVENOUS

## 2024-01-13 MED ORDER — LIDOCAINE HCL (PF) 2 % IJ SOLN
INTRAMUSCULAR | Status: AC
  Filled 2024-01-13: qty 5

## 2024-01-13 MED ORDER — CHLORHEXIDINE GLUCONATE 0.12 % MT SOLN
OROMUCOSAL | Status: AC
  Filled 2024-01-13: qty 15

## 2024-01-13 MED ORDER — MIDAZOLAM HCL 2 MG/2ML IJ SOLN
INTRAMUSCULAR | Status: AC
  Filled 2024-01-13: qty 2

## 2024-01-13 MED ORDER — OXYCODONE HCL 5 MG PO TABS: 5.0000 mg | ORAL_TABLET | ORAL | 0 refills | Status: DC | PRN

## 2024-01-13 MED ORDER — KETOROLAC TROMETHAMINE 30 MG/ML IJ SOLN
INTRAMUSCULAR | Status: DC | PRN
  Administered 2024-01-13: 30 mg via INTRAVENOUS

## 2024-01-13 MED ORDER — ACETAMINOPHEN 10 MG/ML IV SOLN
INTRAVENOUS | Status: DC | PRN
  Administered 2024-01-13: 1000 mg via INTRAVENOUS

## 2024-01-13 MED ORDER — GABAPENTIN 300 MG PO CAPS: 300.0000 mg | ORAL_CAPSULE | Freq: Three times a day (TID) | ORAL | 0 refills | Status: AC

## 2024-01-13 MED ORDER — GLYCOPYRROLATE 0.2 MG/ML IJ SOLN
INTRAMUSCULAR | Status: AC
  Filled 2024-01-13: qty 1

## 2024-01-13 MED ORDER — FENTANYL CITRATE (PF) 100 MCG/2ML IJ SOLN
INTRAMUSCULAR | Status: AC
  Filled 2024-01-13: qty 2

## 2024-01-13 MED ORDER — LIDOCAINE HCL (CARDIAC) PF 100 MG/5ML IV SOSY
PREFILLED_SYRINGE | INTRAVENOUS | Status: DC | PRN
  Administered 2024-01-13: 100 mg via INTRAVENOUS

## 2024-01-13 MED ORDER — IBUPROFEN 600 MG PO TABS: 600.0000 mg | ORAL_TABLET | Freq: Four times a day (QID) | ORAL | 0 refills | Status: AC | PRN

## 2024-01-13 MED ORDER — BUPIVACAINE HCL (PF) 0.5 % IJ SOLN
INTRAMUSCULAR | Status: AC
  Filled 2024-01-13: qty 30

## 2024-01-13 MED ORDER — KETAMINE HCL 50 MG/5ML IJ SOSY
PREFILLED_SYRINGE | INTRAMUSCULAR | Status: DC | PRN
  Administered 2024-01-13: 30 mg via INTRAVENOUS

## 2024-01-13 MED ORDER — MIDAZOLAM HCL (PF) 2 MG/2ML IJ SOLN
INTRAMUSCULAR | Status: DC | PRN
  Administered 2024-01-13: 2 mg via INTRAVENOUS

## 2024-01-13 MED ORDER — BUPIVACAINE LIPOSOME 1.3 % IJ SUSP
INTRAMUSCULAR | Status: DC | PRN
Start: 2024-01-13 — End: 2024-01-13
  Administered 2024-01-13: 10 mL

## 2024-01-13 SURGICAL SUPPLY — 46 items
BIT DRILL 2 STRT CANN (BIT) IMPLANT
BIT DRILL CANN 2.9 (BIT) IMPLANT
BIT DRILL CANN 3.6 (BIT) IMPLANT
BLADE OSC/SAGITTAL MD 5.5X18 (BLADE) IMPLANT
BLADE SURG 15 STRL LF DISP TIS (BLADE) ×4 IMPLANT
BLADE SW THK.38XMED LNG THN (BLADE) IMPLANT
BNDG COHESIVE 4X5 TAN STRL LF (GAUZE/BANDAGES/DRESSINGS) ×2 IMPLANT
BNDG ESMARCH 4X12 STRL LF (GAUZE/BANDAGES/DRESSINGS) ×2 IMPLANT
BNDG GAUZE DERMACEA FLUFF 4 (GAUZE/BANDAGES/DRESSINGS) IMPLANT
BNDG STRETCH GAUZE 3IN X12FT (GAUZE/BANDAGES/DRESSINGS) ×2 IMPLANT
BOOT STEPPER DURA SM (SOFTGOODS) IMPLANT
BUR MIS STRT 2.0X13 (BURR) IMPLANT
BUR MIS STRT 2.0X19.5 (BURR) IMPLANT
CHLORAPREP W/TINT 26 (MISCELLANEOUS) ×2 IMPLANT
COVER PIN YLW 0.028-062 (MISCELLANEOUS) IMPLANT
CUFF TOURN SGL QUICK 18X4 (TOURNIQUET CUFF) ×2 IMPLANT
DRAPE FLUOR MINI C-ARM 54X84 (DRAPES) ×2 IMPLANT
DRAPE UNDER BUTTOCK W/FLU (DRAPES) IMPLANT
ELECTRODE REM PT RTRN 9FT ADLT (ELECTROSURGICAL) ×2 IMPLANT
GAUZE PAD ABD 8X10 STRL (GAUZE/BANDAGES/DRESSINGS) ×2 IMPLANT
GAUZE SPONGE 4X4 12PLY STRL (GAUZE/BANDAGES/DRESSINGS) ×2 IMPLANT
GLOVE PI ORTHO PRO STRL 7.5 (GLOVE) ×2 IMPLANT
GLOVE SURG SYN 6.5 PF PI (GLOVE) IMPLANT
GOWN STRL REUS W/ TWL LRG LVL3 (GOWN DISPOSABLE) ×2 IMPLANT
GUIDEWIRE 0.86MM (WIRE) IMPLANT
GUIDEWIRE 1.6X150 (WIRE) IMPLANT
GUIDEWIRE BEVELED FT 1.4X3.5 (WIRE) IMPLANT
GUIDEWIRE BEVELED FT 1.6X4 (WIRE) IMPLANT
KIT TURNOVER KIT A (KITS) ×2 IMPLANT
PACK EXTREMITY ARMC (MISCELLANEOUS) ×2 IMPLANT
PAD PREP OB/GYN DISP 24X41 (PERSONAL CARE ITEMS) ×2 IMPLANT
PENCIL SMOKE EVACUATOR (MISCELLANEOUS) ×4 IMPLANT
PROBE BIOSP ALOKA ALPHA6 PROST (MISCELLANEOUS) IMPLANT
SCREW BEVELED 3.5X34 (Screw) IMPLANT
SCREW CANN QF BEVELED 4X52 (Screw) IMPLANT
SCREW COMPRESSION 2.5X26MM (Screw) IMPLANT
SET IRRIGATION TUBING (TUBING) IMPLANT
SLEEVE SCD COMPRESS KNEE MED (STOCKING) ×2 IMPLANT
SPRAY CLIP F DRILL SAW (MISCELLANEOUS) IMPLANT
STOCKINETTE ORTHO 6X25 (MISCELLANEOUS) ×2 IMPLANT
SUT MNCRL AB 3-0 PS2 27 (SUTURE) ×2 IMPLANT
SUT MNCRL AB 4-0 PS2 18 (SUTURE) IMPLANT
SUT STRATA 4-0 30 PS-2 (SUTURE) IMPLANT
SUTURE ETHLN 4-0 FS2 18XMF BLK (SUTURE) IMPLANT
SYR CONTROL 10ML LL (SYRINGE) ×2 IMPLANT
WIRE Z .062 C-WIRE SPADE TIP (WIRE) IMPLANT

## 2024-01-13 NOTE — H&P (Signed)
 History and Physical Interval Note:  01/13/2024 7:28 AM  Christine Mueller  has presented today for surgery, with the diagnosis of painful bunion and hammertoe .  The various methods of treatment have been discussed with the patient and family. After consideration of risks, benefits and other options for treatment, the patient has consented to   Procedure(s) with comments: BUNIONECTOMY, WITH METATARSAL OSTEOTOMY (Left) - with exparel  CORRECTION, HAMMER TOE (Left)  as a surgical intervention.  The patient's history has been reviewed, patient examined, no change in status, stable for surgery.  I have reviewed the patient's chart and labs.  Questions were answered to the patient's satisfaction.     Juliene JONELLE Medicine

## 2024-01-13 NOTE — Anesthesia Procedure Notes (Signed)
 Procedure Name: LMA Insertion Date/Time: 01/13/2024 7:46 AM  Performed by: Cris Talavera R, CRNAPre-anesthesia Checklist: Patient identified, Emergency Drugs available, Suction available, Patient being monitored and Timeout performed Patient Re-evaluated:Patient Re-evaluated prior to induction Oxygen Delivery Method: Circle system utilized Preoxygenation: Pre-oxygenation with 100% oxygen Induction Type: IV induction LMA: LMA inserted LMA Size: 3.0 Number of attempts: 1 Placement Confirmation: positive ETCO2 and breath sounds checked- equal and bilateral Tube secured with: Tape Dental Injury: Teeth and Oropharynx as per pre-operative assessment

## 2024-01-13 NOTE — Discharge Instructions (Addendum)
 Post-Surgery Instructions  1. If you are recuperating from surgery anywhere other than home, please be sure to leave us  a number where you can be reached. 2. Go directly home and rest. 3. The keep operated foot (or feet) elevated six inches above the hip when sitting or lying down. 4. Support the elevated foot and leg with pillows under the calf. DO NOT PLACE PILLOWS UNDER THE KNEE. 5. DO NOT REMOVE or get your bandages wet. This will increase your chances of getting an infection. 6. Wear your surgical shoe at all times when you are up. 7. A limited amount of pain and swelling may occur. The skin may take on a bruised appearance. This is no cause for alarm. 8. For slight pain and swelling, apply an ice pack directly over the bandage for 15 minutes every hour. Continue icing until seen in the office. DO NOT apply any form of heat to the area. 9. Have prescription(s) filled immediately and take as directed. 10. Drink lots of liquids, water, and juice. 11. CALL THE OFFICE IMMEDIATELY IF: a. Bleeding continues b. Pain increases and/or does not respond to medication c. Bandage or cast appears too tight d. Any liquids (water, coffee, etc.) have spilled on your bandages. e. Tripping, falling, or stubbing the surgical foot f. If your temperature rises above 101 g. If you have ANY questions at all 12. Please use the crutches, knee scooter, or walker you have prescribed, rented, or purchased. If you are non-weight bearing DO NOT put weight on the operated foot for _________ days. If you are weight-bearing, follow your physician's instructions. You are expected to be: ? weight-bearing ? non-weight bearing 13. Special Instructions: _____________________________________________________________ _________________________________________________________________________________ _________________________________________________________________________________  14. Your next appointment is: 01/17/2024  1:45 PM   If you need to reach the nurse for any reason, please call: [place]: [phone] [place]: [phone] [place]: [phone]

## 2024-01-13 NOTE — Transfer of Care (Signed)
 Immediate Anesthesia Transfer of Care Note  Patient: Christine Mueller  Procedure(s) Performed: ROMAYNE, WITH METATARSAL OSTEOTOMY (Left: Toe) CORRECTION, HAMMER TOE (Left: Toe)  Patient Location: PACU  Anesthesia Type:General  Level of Consciousness: drowsy  Airway & Oxygen Therapy: Patient Spontanous Breathing  Post-op Assessment: Report given to RN and Post -op Vital signs reviewed and stable  Post vital signs: Reviewed and stable  Last Vitals:  Vitals Value Taken Time  BP 128/77 01/13/24 09:45  Temp    Pulse 88 01/13/24 09:48  Resp 22 01/13/24 09:48  SpO2 100 % 01/13/24 09:48  Vitals shown include unfiled device data.  Last Pain:  Vitals:   01/13/24 0618  PainSc: 0-No pain         Complications: There were no known notable events for this encounter.

## 2024-01-13 NOTE — Anesthesia Preprocedure Evaluation (Signed)
 Anesthesia Evaluation  Patient identified by MRN, date of birth, ID band Patient awake    Reviewed: Allergy & Precautions, NPO status , Patient's Chart, lab work & pertinent test results, reviewed documented beta blocker date and time   Airway Mallampati: III  TM Distance: >3 FB Neck ROM: Full    Dental no notable dental hx. (+) Teeth Intact   Pulmonary asthma , COPD,  COPD inhaler, Current Smoker and Patient abstained from smoking.   Pulmonary exam normal breath sounds clear to auscultation       Cardiovascular hypertension, Pt. on medications and Pt. on home beta blockers negative cardio ROS Normal cardiovascular exam Rhythm:Regular Rate:Normal     Neuro/Psych negative neurological ROS  negative psych ROS   GI/Hepatic negative GI ROS, Neg liver ROS,GERD  Medicated and Controlled,,  Endo/Other  negative endocrine ROSdiabetes, Well Controlled, Gestational, Oral Hypoglycemic Agents    Renal/GU      Musculoskeletal   Abdominal   Peds  Hematology negative hematology ROS (+) Blood dyscrasia, anemia   Anesthesia Other Findings Past Medical History: No date: Anemia No date: Asthma No date: Bunion, left foot No date: COPD (chronic obstructive pulmonary disease) (HCC) 12/23/2015: Essential hypertension No date: Gestational diabetes No date: Hammertoe of left foot No date: Hypertension No date: Marijuana use No date: Renal artery stenosis No date: Vapes nicotine  containing substance  Past Surgical History: 09/04/2015: CESAREAN SECTION; N/A     Comment:  Procedure: CESAREAN SECTION;  Surgeon: Vonn VEAR Inch,               MD;  Location: [facility];  Service: Obstetrics;               Laterality: N/A; No date: COLONOSCOPY No date: wisdom teeth removal     Reproductive/Obstetrics negative OB ROS (+) Pregnancy Pre eclampsia                              Anesthesia  Physical Anesthesia Plan  ASA: 3  Anesthesia Plan:    Post-op Pain Management:    Induction: Intravenous  PONV Risk Score and Plan: 3 and Ondansetron , Dexamethasone  and Midazolam   Airway Management Planned: LMA  Additional Equipment:   Intra-op Plan:   Post-operative Plan: Extubation in OR  Informed Consent: I have reviewed the patients History and Physical, chart, labs and discussed the procedure including the risks, benefits and alternatives for the proposed anesthesia with the patient or authorized representative who has indicated his/her understanding and acceptance.     Dental Advisory Given  Plan Discussed with: Anesthesiologist, CRNA and Surgeon  Anesthesia Plan Comments: (Patient consented for risks of anesthesia including but not limited to:  - adverse reactions to medications - damage to eyes, teeth, lips or other oral mucosa - nerve damage due to positioning  - sore throat or hoarseness - Damage to heart, brain, nerves, lungs, other parts of body or loss of life  Patient voiced understanding and assent.)        Anesthesia Quick Evaluation

## 2024-01-13 NOTE — Op Note (Signed)
 Patient Name: Christine Mueller DOB: [birth year]  MRN: [id number]   Date of Service: 01/13/2024  Surgeon: Dr. Juliene Medicine, DPM Assistants: None Pre-operative Diagnosis:  Hallux valgus, left M20.12 Hammer toe of left foot M20.42 Post-operative Diagnosis:  Hallux valgus, left M20.12 Hammer toe of left foot M20.42 Procedures: Bunionectomy by double osteotomy Correction fourth hammertoe left Pathology/Specimens: * No specimens in log * Anesthesia: General Hemostasis:  Total Tourniquet Time Documented: Calf (Left) - 72 minutes Total: Calf (Left) - 72 minutes  Estimated Blood Loss: 1 mL Materials:  Implant Name Type Inv. Item Serial No. Manufacturer Lot No. LRB No. Used Action  SCREW BEVELED 3.5X34 - [id number] Screw SCREW BEVELED 3.5X34  [employer]  Left 1 Implanted  SCREW CANN QF BEVELED 4X52 - [id number] Screw SCREW CANN QF BEVELED 4X52  [employer]  Left 1 Implanted  SCREW COMPRESSION 2.5X26MM - [id number] Screw SCREW COMPRESSION 2.5X26MM  [employer]  Left 1 Implanted   Medications: 10 cc of Exparel  mixed with 20 cc of 0.5% Marcaine  plain administered an ankle block.  Total of 133 units of Exparel  used Complications: No complication noted  Indications for Procedure:  This is a 48 y.o. female with a history of painful bunion deformity with hallux valgus hallux interphalangeus and left fourth hammertoe that was painful.  She elected for operative intervention after failing nonoperative treatment. All risks, benefits and potential complications discussed prior to the procedure. All questions addressed. Informed consent signed and reviewed.      Procedure in Detail: After identifying the patient in the pre-operative holding area and marking the left lower extremity as the correct side and site of surgery, the patient was brought to the operating room and transferred from the stretcher to the operating room table and placed in the supine position.  A safety belt was placed and  secured around waist.  A timeout was then performed.  The patient was then sedated with IV sedation by the anesthesia team.  A local field block was then performed with 20 cc 0.5% Bupivacaine  and 10 cc of Exparel  mixed together to perform an ankle block.  The left lower extremity was then prepped and draped in the usual sterile fashion.  The left lower extremity was elevated, exsanguinated and the pneumatic calf tourniquet was inflated to 250 mmHg.    The fluoroscope was used to locate the distal metaphyseal/diaphyseal junction of the first metatarsal. An incision was made and the elevator was used to raise the soft tissues over the dorsal metatarsal. The cutting burr was used to create a distal metatarsal osteotomy with axis directed slightly distal and plantar. The osteotomy was completed, the shifting device was inserted into the proximal canal and the capital fragment secured with a K wire. The capital fragment was translated laterally to an appropriate level of correction. Guidewires for the 3.59mm and 4.42mm screws were then placed in parallel fashion. The screw lengths were measured after a small incision was made, the screws were then drilled and inserted with good stability noted.  A small incision was then made over the distal portion of the medial proximal phalanx. The soft tissues were elevated dorsally and plantarly. An Akin osteotomy was created utilizing the burr. The osteotomy was reduced and a guidewire was placed from distal lateral to proximal medial across the osteotomy. The screw was measured, drilled, and inserted with good compression and stability noted.   The medial eminence and bunion was then resected using the wedge burr, rongeur and rasped smooth.  Once an appropriate radiographic and clinical result was achieved the incisions were thoroughly irrigated and rasped of all bony debris with copious amounts of normal sterile saline.  Attention was then directed to the left fourth digit  where a semioblique excisional and wedge to remove the corn incision was made from the base of the proximal phalanx to the distal middle phalanx.  This was carried deep to expose the extensor digitorum longus tendon and a transverse tenotomy was made.  The PIPJ was exposed and the collateral ligaments were released.  The articular surface of the head of the proximal phalanx and the base of the middle phalanx was then resected using a sagittal saw.  A 0.062 inch Kirschner wire was then inserted through the base of the middle phalanx through the distal phalanx and out the tip of the toe just under the nail.  This was then driven retrograde to the level of the base of the proximal phalanx.  Good compression of the osteotomy site was noted clinically and fluoroscopically with adequate reduction of the deformity.   Closure was then initiated and consisted of 4-0 nylon sutures. A postop dressing was applied consisting of xeroform, 4x4 gauze, Kerlix and Ace wrap was applied under light compression with the hallux in a slight frontal varus and neutral transverse position.  The patient tolerated the procedure well.  The tourniquet was then deflated.  Immediate capillary refill was noted to all toes.  She was aroused from sedation and transferred back to the stretcher and to the post anesthesia care unit for further monitoring.  She was discharged to home in good condition, and will be weightbearing as  tolerated in a cam walker boot. She will follow up with me in 1 week for postop evaluation.      Disposition: Following a period of post-operative monitoring, patient will be transferred to home.

## 2024-01-13 NOTE — Anesthesia Postprocedure Evaluation (Signed)
 Anesthesia Post Note  Patient: Christine Mueller  Procedure(s) Performed: BUNIONECTOMY, WITH METATARSAL OSTEOTOMY (Left: Toe) CORRECTION, HAMMER TOE (Left: Toe)  Patient location during evaluation: PACU Anesthesia Type: General Level of consciousness: awake and alert Pain management: pain level controlled Vital Signs Assessment: post-procedure vital signs reviewed and stable Respiratory status: spontaneous breathing, nonlabored ventilation, respiratory function stable and patient connected to nasal cannula oxygen Cardiovascular status: blood pressure returned to baseline and stable Postop Assessment: no apparent nausea or vomiting Anesthetic complications: no   There were no known notable events for this encounter.   Last Vitals:  Vitals:   01/13/24 0618 01/13/24 0945  BP: 138/88 128/77  Pulse: 86 99  Resp: 18 20  Temp: (!) 36.1 C   SpO2: 100% 100%    Last Pain:  Vitals:   01/13/24 0945  PainSc: Asleep                 Debby Mines

## 2024-01-16 ENCOUNTER — Encounter: Payer: Self-pay | Admitting: Podiatry

## 2024-01-17 ENCOUNTER — Encounter: Payer: Self-pay | Admitting: Podiatry

## 2024-01-17 ENCOUNTER — Ambulatory Visit (INDEPENDENT_AMBULATORY_CARE_PROVIDER_SITE_OTHER): Admitting: Podiatry

## 2024-01-17 ENCOUNTER — Ambulatory Visit (INDEPENDENT_AMBULATORY_CARE_PROVIDER_SITE_OTHER)

## 2024-01-17 VITALS — BP 139/86 | HR 77 | Temp 98.6°F

## 2024-01-17 DIAGNOSIS — M2012 Hallux valgus (acquired), left foot: Secondary | ICD-10-CM

## 2024-01-17 DIAGNOSIS — M21612 Bunion of left foot: Secondary | ICD-10-CM

## 2024-01-17 DIAGNOSIS — M2042 Other hammer toe(s) (acquired), left foot: Secondary | ICD-10-CM

## 2024-01-17 MED ORDER — OXYCODONE HCL 5 MG PO TABS: 5.0000 mg | ORAL_TABLET | ORAL | 0 refills | Status: AC | PRN

## 2024-01-17 NOTE — Progress Notes (Signed)
  Subjective:  Patient ID: Christine Mueller, female    DOB: 05-24-1975,  MRN: [id number]  Chief Complaint  Patient presents with   Routine Post Op    POV # 1 DOS 01/13/24 LT FOOT BUNION CORRECTION AND LT 4TH TOE CORRECTION It still hurts.  My pain level is about a 10.  I fell in the driveway before I came here.  I don't think I hit my foot.     48 y.o. female returns for post-op check.   Review of Systems: Negative except as noted in the HPI. Denies N/V/F/Ch.   Objective:   Vitals:   01/17/24 1405  BP: 139/86  Pulse: 77  Temp: 98.6 F (37 C)   There is no height or weight on file to calculate BMI. Constitutional Well developed. Well nourished.  Vascular Foot warm and well perfused. Capillary refill normal to all digits.  Calf is soft and supple, no posterior calf or knee pain, negative Homans' sign  Neurologic Normal speech. Oriented to person, place, and time. Epicritic sensation to light touch grossly present bilaterally.  Dermatologic Skin healing well without signs of infection. Skin edges well coapted without signs of infection.  Suture on medial hallux has pulled through  Orthopedic: Tenderness to palpation noted about the surgical site.  Moderate edema   Multiple view plain film radiographs: Some diastases of fourth PIPJ fusion site with distal translation of wire Assessment:   1. Hallux valgus with bunions, left   2. Hammertoe of left foot    Plan:  Patient was evaluated and treated and all questions answered.  S/p foot surgery left -Progressing as expected post-operatively.  Some distal translation of wire distal phalanx with diastases of the fusion site we discussed this may end up leading to a pseudoarthrosis but these typically are asymptomatic if she has any further complication of the wire we may need to remove it earlier.  Fortunately, no other complications of her fall.  I reinforced an area where a suture pulled through the medial incision with a  Steri-Strip and there is no active bleeding currently -XR: Noted above -WB Status: Weightbearing as tolerated in cam boot on left -Sutures: Return in 2 weeks to remove. -Medications: Refill oxycodone  sent to pharmacy -Foot redressed.  No follow-ups on file.

## 2024-02-02 ENCOUNTER — Encounter

## 2024-02-02 ENCOUNTER — Encounter: Payer: Self-pay | Admitting: Podiatry

## 2024-02-02 ENCOUNTER — Ambulatory Visit: Admitting: Podiatry

## 2024-02-02 VITALS — Ht 63.0 in | Wt 155.0 lb

## 2024-02-02 DIAGNOSIS — M21612 Bunion of left foot: Secondary | ICD-10-CM

## 2024-02-02 DIAGNOSIS — M2042 Other hammer toe(s) (acquired), left foot: Secondary | ICD-10-CM

## 2024-02-02 DIAGNOSIS — M2012 Hallux valgus (acquired), left foot: Secondary | ICD-10-CM

## 2024-02-05 NOTE — Progress Notes (Signed)
°  Subjective:  Patient ID: Christine Mueller, female    DOB: 1975/09/19,  MRN: [id number]  Chief Complaint  Patient presents with   Routine Post Op    POV # 2 DOS 01/13/24 LT FOOT BUNION CORRECTION AND LT 4TH TOE CORRECTION, pt is here for suture removal, she states everything is going well still has some pain on and off, continues to wear boot as instructed.     48 y.o. female returns for post-op check.   Review of Systems: Negative except as noted in the HPI. Denies N/V/F/Ch.   Objective:   There were no vitals filed for this visit.  Body mass index is 27.46 kg/m. Constitutional Well developed. Well nourished.  Vascular Foot warm and well perfused. Capillary refill normal to all digits.  Calf is soft and supple, no posterior calf or knee pain, negative Homans' sign  Neurologic Normal speech. Oriented to person, place, and time. Epicritic sensation to light touch grossly present bilaterally.  Dermatologic Skin incisions well healed and non hypertrophic  Orthopedic: Tenderness to palpation noted about the surgical site.  Moderate edema   Multiple view plain film radiographs: Some diastases of fourth PIPJ fusion site with distal translation of wire Assessment:   1. Hallux valgus with bunions, left   2. Hammertoe of left foot    Plan:  Patient was evaluated and treated and all questions answered.  S/p foot surgery left -Doing well sutures removed uneventfully. WBAT in Cam boot, return 3 weeks for new xrays and transition to sx shoe. OK to wash foot, caution w/ wire when doing so  No follow-ups on file.

## 2024-02-14 ENCOUNTER — Other Ambulatory Visit (HOSPITAL_COMMUNITY)
Admission: RE | Admit: 2024-02-14 | Discharge: 2024-02-14 | Disposition: A | Discharge status: Home / Self Care | Source: Ambulatory Visit

## 2024-02-14 ENCOUNTER — Ambulatory Visit (INDEPENDENT_AMBULATORY_CARE_PROVIDER_SITE_OTHER): Payer: Self-pay | Admitting: Family Medicine

## 2024-02-14 ENCOUNTER — Encounter: Payer: Self-pay | Admitting: Family Medicine

## 2024-02-14 VITALS — BP 122/74 | HR 74 | Ht 63.0 in | Wt 151.8 lb

## 2024-02-14 DIAGNOSIS — Z124 Encounter for screening for malignant neoplasm of cervix: Secondary | ICD-10-CM | POA: Diagnosis not present

## 2024-02-14 DIAGNOSIS — Z1151 Encounter for screening for human papillomavirus (HPV): Secondary | ICD-10-CM | POA: Diagnosis not present

## 2024-02-14 DIAGNOSIS — Z114 Encounter for screening for human immunodeficiency virus [HIV]: Secondary | ICD-10-CM

## 2024-02-14 DIAGNOSIS — Z113 Encounter for screening for infections with a predominantly sexual mode of transmission: Secondary | ICD-10-CM | POA: Insufficient documentation

## 2024-02-14 DIAGNOSIS — Z01419 Encounter for gynecological examination (general) (routine) without abnormal findings: Secondary | ICD-10-CM | POA: Diagnosis present

## 2024-02-14 NOTE — Progress Notes (Signed)
" ° ° °  SUBJECTIVE:   CHIEF COMPLAINT / HPI:   Here for pap Last pap: 2019, NILM STD testing: desires Contraception: condoms LMP 12/1 Declines vaginal symptoms or STD exposures   PERTINENT  PMH / PSH: HTN on amlodipine , spironolactone    OBJECTIVE:   BP 122/74   Pulse 74   Ht 5' 3 (1.6 m)   Wt 151 lb 12.8 oz (68.9 kg)   LMP 01/23/2024   SpO2 97%   BMI 26.89 kg/m    General: NAD, pleasant, able to participate in exam Respiratory: No respiratory distress Skin: warm and dry, no rashes noted Psych: Normal affect and mood  Pelvic exam: normal external genitalia, vulva, vagina, cervix, uterus and adnexa, bimanual exam normal, exam chaperoned by Thersia CMA.  ASSESSMENT/PLAN:   Assessment & Plan Screening for cervical cancer Screen for STD (sexually transmitted disease) Screening for HIV (human immunodeficiency virus) Pap + HPV, STD testing done today HIV/RPR F/u results as appropriate    Payton Coward, MD [facility] "

## 2024-02-14 NOTE — Patient Instructions (Signed)
 If any of your results from today are abnormal and/or require changes to your medical care, I will give you a call. Otherwise, I will send you a letter in the mail or a message on MyChart.

## 2024-02-15 LAB — CYTOLOGY - PAP
Chlamydia: NEGATIVE
Comment: NEGATIVE
Comment: NEGATIVE
Comment: NEGATIVE
Comment: NORMAL
Diagnosis: NEGATIVE
High risk HPV: NEGATIVE
Neisseria Gonorrhea: NEGATIVE
Trichomonas: NEGATIVE

## 2024-02-15 LAB — HIV ANTIBODY (ROUTINE TESTING W REFLEX): HIV Screen 4th Generation wRfx: NONREACTIVE

## 2024-02-15 LAB — SYPHILIS: RPR W/REFLEX TO RPR TITER AND TREPONEMAL ANTIBODIES, TRADITIONAL SCREENING AND DIAGNOSIS ALGORITHM: RPR Ser Ql: NONREACTIVE

## 2024-02-16 ENCOUNTER — Ambulatory Visit: Payer: Self-pay | Admitting: Family Medicine

## 2024-02-16 DIAGNOSIS — B9689 Other specified bacterial agents as the cause of diseases classified elsewhere: Secondary | ICD-10-CM

## 2024-02-21 ENCOUNTER — Ambulatory Visit (INDEPENDENT_AMBULATORY_CARE_PROVIDER_SITE_OTHER): Admitting: Podiatry

## 2024-02-21 ENCOUNTER — Encounter: Payer: Self-pay | Admitting: Podiatry

## 2024-02-21 ENCOUNTER — Ambulatory Visit (INDEPENDENT_AMBULATORY_CARE_PROVIDER_SITE_OTHER)

## 2024-02-21 DIAGNOSIS — M2012 Hallux valgus (acquired), left foot: Secondary | ICD-10-CM | POA: Diagnosis not present

## 2024-02-21 DIAGNOSIS — M21612 Bunion of left foot: Secondary | ICD-10-CM

## 2024-02-21 MED ORDER — METRONIDAZOLE 500 MG PO TABS: 500.0000 mg | ORAL_TABLET | Freq: Two times a day (BID) | ORAL | 0 refills | Status: AC

## 2024-02-22 ENCOUNTER — Encounter: Payer: Self-pay | Admitting: Podiatry

## 2024-02-22 NOTE — Progress Notes (Signed)
"  °  Subjective:  Patient ID: Christine Mueller, female    DOB: 09/21/1975,  MRN: [id number]  Chief Complaint  Patient presents with   Post-op Follow-up    Rm 4 POV # 3 DOS 01/13/24 LT FOOT BUNION CORRECTION AND LT 4TH TOE CORRECTION( Alba Kriesel PT). Pt states some pain on the medial side of the left foot ( pt states may be caused by the walking boot). Moderate Swelling in toes with pin partially intact on left 4th toe.     48 y.o. female returns for post-op check.   Review of Systems: Negative except as noted in the HPI. Denies N/V/F/Ch.   Objective:   There were no vitals filed for this visit.  There is no height or weight on file to calculate BMI. Constitutional Well developed. Well nourished.  Vascular Foot warm and well perfused. Capillary refill normal to all digits.  Calf is soft and supple, no posterior calf or knee pain, negative Homans' sign  Neurologic Normal speech. Oriented to person, place, and time. Epicritic sensation to light touch grossly present bilaterally.  Dermatologic Skin incisions well healed and non hypertrophic  Orthopedic: Edema and pain is improved, there is distal translation of the K wire   Multiple view plain film radiographs: Further distal translation of the fourth PIPJ K wire, alignment is maintained there is early periosteal bone growth developing along the bunionectomy site Assessment:   1. Hallux valgus with bunions, left    Plan:  Patient was evaluated and treated and all questions answered.  S/p foot surgery left Kirschner wire removed uneventfully radiographs taken weightbearing as tolerated in surgical shoe and return in 6 weeks for new radiographs.  Surgical shoe dispensed.  Return in about 6 weeks (around 04/03/2024) for post op (new x-rays).  "

## 2024-02-28 ENCOUNTER — Telehealth: Payer: Self-pay | Admitting: Podiatry

## 2024-02-28 DIAGNOSIS — Z0279 Encounter for issue of other medical certificate: Secondary | ICD-10-CM

## 2024-02-28 NOTE — Telephone Encounter (Signed)
 lft mess about forms recd from [facility] and I will complete and that she needs to pay 25.00. fee at appt 03/06/24. adv I would email her a copy for her recrds as well. RTW 04/04/24

## 2024-02-28 NOTE — Telephone Encounter (Signed)
 Faxed [phone] and emailed [email or web address]

## 2024-03-02 ENCOUNTER — Telehealth: Payer: Self-pay | Admitting: Podiatry

## 2024-03-02 ENCOUNTER — Telehealth: Payer: Self-pay | Admitting: Family Medicine

## 2024-03-02 ENCOUNTER — Encounter: Payer: Self-pay | Admitting: Podiatry

## 2024-03-02 NOTE — Telephone Encounter (Signed)
 Contacted the patient to get her scheduled for an appt. The patient informed me that those forms were sent to the wrong office. Patient stated that the forms were suppose to be sent to her ankle and foot doctor. Patient state that she will contact the office to get it sent to the right place.

## 2024-03-02 NOTE — Telephone Encounter (Signed)
-----   Message from [facility] sent at 03/01/2024  5:22 PM EST ----- Regarding: disability paperwork appt Received paperwork for disability claim, according to paperwork patient will need an appointment to complete this  Please call to schedule an appt when able  Thanks atif

## 2024-03-02 NOTE — Telephone Encounter (Signed)
 pt said [employer] is asking why is she out until 04/04/24. i adv her would note due to her med condition/surgery is why she is out.I will refax them and email her

## 2024-03-06 ENCOUNTER — Ambulatory Visit: Admitting: Podiatry

## 2024-03-06 ENCOUNTER — Ambulatory Visit (INDEPENDENT_AMBULATORY_CARE_PROVIDER_SITE_OTHER)

## 2024-03-06 ENCOUNTER — Ambulatory Visit

## 2024-03-06 VITALS — Ht 63.0 in | Wt 151.0 lb

## 2024-03-06 DIAGNOSIS — M21612 Bunion of left foot: Secondary | ICD-10-CM | POA: Diagnosis not present

## 2024-03-06 DIAGNOSIS — M2012 Hallux valgus (acquired), left foot: Secondary | ICD-10-CM

## 2024-03-06 DIAGNOSIS — M2042 Other hammer toe(s) (acquired), left foot: Secondary | ICD-10-CM

## 2024-03-07 NOTE — Progress Notes (Signed)
"  °  Subjective:  Patient ID: Christine Mueller, female    DOB: [birth year],  MRN: [id number]  Chief Complaint  Patient presents with   Post-op Follow-up    RM 1 POV # 4DOS 01/13/24 LT FOOT BUNION CORRECTION AND LT 4TH TOE CORRECTION( Pennie Vanblarcom PT). Pt states some pain in the left 4th toe with regular foot swelling after prolonged walking.     49 y.o. female returns for post-op check.  Overall feeling well  Review of Systems: Negative except as noted in the HPI. Denies N/V/F/Ch.   Objective:   There were no vitals filed for this visit.  Body mass index is 26.75 kg/m. Constitutional Well developed. Well nourished.  Vascular Foot warm and well perfused. Capillary refill normal to all digits.  Calf is soft and supple, no posterior calf or knee pain, negative Homans' sign  Neurologic Normal speech. Oriented to person, place, and time. Epicritic sensation to light touch grossly present bilaterally.  Dermatologic Skin incisions well healed and non hypertrophic  Orthopedic: Edema and pain is improved, there is distal translation of the K wire   Multiple view plain film radiographs: Further distal translation of the fourth PIPJ K wire, alignment is maintained there is early periosteal bone growth developing along the bunionectomy site Assessment:   1. Hallux valgus with bunions, left   2. Hammertoe of left foot    Plan:  Patient was evaluated and treated and all questions answered.  S/p foot surgery left Doing well next week can begin to transition back to regular shoe gear as tolerated gradually.  Advised to avoid flimsy shoes stiff supportive soled sneakers for now.  Avoid impact activity.  Low impact exercise okay as tolerated.  Follow-up in 2 months with new x-rays.  Return in about 2 months (around 05/04/2024) for surgery follow up (new xrays).  "

## 2024-03-12 ENCOUNTER — Telehealth: Payer: Self-pay | Admitting: Podiatry

## 2024-03-12 ENCOUNTER — Encounter: Payer: Self-pay | Admitting: Podiatry

## 2024-03-12 NOTE — Telephone Encounter (Signed)
 pt is calling [employer] [facility] to see what is missing on the form I faxed to them. She said they want her MR. I adv her of same form was faxed, but no fee paid so will not fill out. They recd the note but needs details* all on form about surgery, etc.  She will call me back and let me know what is missing.

## 2024-03-12 NOTE — Telephone Encounter (Signed)
 See prev notes refaxed revised form and new letter  Emailed to the patient for her records

## 2024-03-12 NOTE — Telephone Encounter (Signed)
 See prev notes

## 2024-03-12 NOTE — Telephone Encounter (Signed)
 Faxed office notes from last visit to [facility] [phone] and will wait for pt to call bk once she speaks with them as to what is missing on form that was faxed.  Emailed her as well to show the notes were faxed.

## 2024-03-12 NOTE — Telephone Encounter (Signed)
 Pt left mess that Nov office visit notes are needed too. I faxed those and emailed them to her as well. I adv her to make sure to check to see what they need if they are going to pay for 25 fee for forms to be filled out. See prev notes, she was suppose to pay at her visit 03/06/24 and she did not.I adv her of it and she today they are going to pay it for her, so we just need to know what they need from us  to pay it for her.

## 2024-05-03 ENCOUNTER — Encounter: Admitting: Podiatry
